# Patient Record
Sex: Male | Born: 1950 | Race: White | Hispanic: No | Marital: Married | Smoking: Never smoker
Health system: Southern US, Community
[De-identification: ages and names within clinical notes are randomized; demographics above are authoritative.]

## PROBLEM LIST (undated history)

## (undated) DIAGNOSIS — E785 Hyperlipidemia, unspecified: Secondary | ICD-10-CM

## (undated) DIAGNOSIS — H269 Unspecified cataract: Secondary | ICD-10-CM

## (undated) DIAGNOSIS — I10 Essential (primary) hypertension: Secondary | ICD-10-CM

## (undated) DIAGNOSIS — K5792 Diverticulitis of intestine, part unspecified, without perforation or abscess without bleeding: Secondary | ICD-10-CM

## (undated) DIAGNOSIS — K635 Polyp of colon: Secondary | ICD-10-CM

## (undated) DIAGNOSIS — K219 Gastro-esophageal reflux disease without esophagitis: Secondary | ICD-10-CM

## (undated) DIAGNOSIS — M1990 Unspecified osteoarthritis, unspecified site: Secondary | ICD-10-CM

## (undated) HISTORY — DX: Unspecified cataract: H26.9

## (undated) HISTORY — DX: Hyperlipidemia, unspecified: E78.5

## (undated) HISTORY — PX: COLONOSCOPY: SHX174

## (undated) HISTORY — DX: Essential (primary) hypertension: I10

## (undated) HISTORY — DX: Polyp of colon: K63.5

## (undated) HISTORY — DX: Unspecified osteoarthritis, unspecified site: M19.90

## (undated) HISTORY — PX: APPENDECTOMY: SHX54

## (undated) HISTORY — PX: PRE-MALIGNANT / BENIGN SKIN LESION EXCISION: SHX160

## (undated) HISTORY — DX: Gastro-esophageal reflux disease without esophagitis: K21.9

## (undated) HISTORY — DX: Diverticulitis of intestine, part unspecified, without perforation or abscess without bleeding: K57.92

## (undated) HISTORY — PX: POLYPECTOMY: SHX149

---

## 2005-01-15 ENCOUNTER — Ambulatory Visit: Payer: Self-pay | Admitting: Internal Medicine

## 2005-01-21 ENCOUNTER — Ambulatory Visit: Payer: Self-pay | Admitting: Internal Medicine

## 2005-02-05 ENCOUNTER — Ambulatory Visit: Payer: Self-pay | Admitting: Internal Medicine

## 2005-02-12 ENCOUNTER — Ambulatory Visit: Payer: Self-pay | Admitting: Internal Medicine

## 2005-03-26 ENCOUNTER — Ambulatory Visit: Payer: Self-pay | Admitting: Internal Medicine

## 2005-04-08 ENCOUNTER — Ambulatory Visit: Payer: Self-pay | Admitting: Gastroenterology

## 2005-04-08 ENCOUNTER — Ambulatory Visit: Payer: Self-pay | Admitting: Internal Medicine

## 2005-04-22 ENCOUNTER — Ambulatory Visit: Payer: Self-pay | Admitting: Gastroenterology

## 2005-04-22 ENCOUNTER — Encounter (INDEPENDENT_AMBULATORY_CARE_PROVIDER_SITE_OTHER): Payer: Self-pay | Admitting: Specialist

## 2005-11-13 ENCOUNTER — Ambulatory Visit: Payer: Self-pay | Admitting: Internal Medicine

## 2006-05-06 ENCOUNTER — Ambulatory Visit: Payer: Self-pay | Admitting: Internal Medicine

## 2006-05-31 ENCOUNTER — Ambulatory Visit: Payer: Self-pay | Admitting: Internal Medicine

## 2006-07-26 ENCOUNTER — Ambulatory Visit: Payer: Self-pay | Admitting: Internal Medicine

## 2007-01-18 ENCOUNTER — Ambulatory Visit: Payer: Self-pay | Admitting: Internal Medicine

## 2007-01-27 ENCOUNTER — Ambulatory Visit: Payer: Self-pay | Admitting: Internal Medicine

## 2007-03-02 ENCOUNTER — Ambulatory Visit: Payer: Self-pay | Admitting: Internal Medicine

## 2007-03-02 LAB — CONVERTED CEMR LAB
ALT: 25 units/L (ref 0–40)
AST: 27 units/L (ref 0–37)
Alkaline Phosphatase: 46 units/L (ref 39–117)
BUN: 17 mg/dL (ref 6–23)
Basophils Relative: 0.2 % (ref 0.0–1.0)
Calcium: 9.3 mg/dL (ref 8.4–10.5)
Chloride: 106 meq/L (ref 96–112)
Cholesterol: 206 mg/dL (ref 0–200)
Eosinophils Absolute: 0.1 10*3/uL (ref 0.0–0.6)
GFR calc Af Amer: 151 mL/min
GFR calc non Af Amer: 125 mL/min
HDL: 58.1 mg/dL (ref >39.0)
Lymphocytes Relative: 39.2 % (ref 12.0–46.0)
MCV: 96 fL (ref 78.0–100.0)
Monocytes Relative: 12.8 % — ABNORMAL HIGH (ref 3.0–11.0)
Neutro Abs: 1.9 10*3/uL (ref 1.4–7.7)
Platelets: 181 10*3/uL (ref 150–400)
RBC: 4.88 M/uL (ref 4.22–5.81)
TSH: 2.47 microintl units/mL (ref 0.35–5.50)
Triglycerides: 152 mg/dL — ABNORMAL HIGH (ref 0–149)
VLDL: 30 mg/dL (ref 0–40)

## 2007-04-28 ENCOUNTER — Ambulatory Visit: Payer: Self-pay | Admitting: Internal Medicine

## 2007-08-30 ENCOUNTER — Ambulatory Visit: Payer: Self-pay | Admitting: Internal Medicine

## 2007-08-30 LAB — CONVERTED CEMR LAB
Bilirubin, Direct: 0.2 mg/dL (ref 0.0–0.3)
Cholesterol: 192 mg/dL (ref 0–200)
HDL: 48.4 mg/dL (ref >39.0)
Total Bilirubin: 1.2 mg/dL (ref 0.3–1.2)
Total CHOL/HDL Ratio: 4
Total Protein: 7.1 g/dL (ref 6.0–8.3)
Triglycerides: 175 mg/dL — ABNORMAL HIGH (ref 0–149)

## 2007-08-31 DIAGNOSIS — I10 Essential (primary) hypertension: Secondary | ICD-10-CM

## 2007-08-31 DIAGNOSIS — E785 Hyperlipidemia, unspecified: Secondary | ICD-10-CM

## 2007-09-06 ENCOUNTER — Ambulatory Visit: Payer: Self-pay | Admitting: Internal Medicine

## 2007-09-06 LAB — CONVERTED CEMR LAB
Cholesterol, target level: 200 mg/dL
LDL Goal: 130 mg/dL

## 2007-12-29 ENCOUNTER — Telehealth: Payer: Self-pay | Admitting: Internal Medicine

## 2008-01-16 ENCOUNTER — Ambulatory Visit: Payer: Self-pay | Admitting: Internal Medicine

## 2008-01-16 LAB — CONVERTED CEMR LAB
ALT: 38 units/L (ref 0–53)
AST: 29 units/L (ref 0–37)
Albumin: 4.4 g/dL (ref 3.5–5.2)
Alkaline Phosphatase: 50 units/L (ref 39–117)
Triglycerides: 155 mg/dL — ABNORMAL HIGH (ref 0–149)
VLDL: 31 mg/dL (ref 0–40)

## 2008-01-23 ENCOUNTER — Ambulatory Visit: Payer: Self-pay | Admitting: Internal Medicine

## 2008-05-25 ENCOUNTER — Ambulatory Visit: Payer: Self-pay | Admitting: Internal Medicine

## 2008-05-25 LAB — CONVERTED CEMR LAB
AST: 30 units/L (ref 0–37)
Alkaline Phosphatase: 55 units/L (ref 39–117)
Basophils Absolute: 0 10*3/uL (ref 0.0–0.1)
Blood in Urine, dipstick: NEGATIVE
Chloride: 106 meq/L (ref 96–112)
Cholesterol: 170 mg/dL (ref 0–200)
Eosinophils Absolute: 0.1 10*3/uL (ref 0.0–0.7)
GFR calc non Af Amer: 93 mL/min
Glucose, Urine, Semiquant: NEGATIVE
HDL: 53.9 mg/dL (ref >39.0)
MCHC: 34.7 g/dL (ref 30.0–36.0)
MCV: 98 fL (ref 78.0–100.0)
Neutrophils Relative %: 51.7 % (ref 43.0–77.0)
Platelets: 171 10*3/uL (ref 150–400)
Potassium: 3.8 meq/L (ref 3.5–5.1)
Protein, U semiquant: NEGATIVE
Sodium: 146 meq/L — ABNORMAL HIGH (ref 135–145)
Total Bilirubin: 1.1 mg/dL (ref 0.3–1.2)
VLDL: 23 mg/dL (ref 0–40)
WBC Urine, dipstick: NEGATIVE
WBC: 4.3 10*3/uL — ABNORMAL LOW (ref 4.5–10.5)
pH: 7

## 2008-06-01 ENCOUNTER — Ambulatory Visit: Payer: Self-pay | Admitting: Internal Medicine

## 2008-07-16 ENCOUNTER — Telehealth: Payer: Self-pay | Admitting: Internal Medicine

## 2008-10-11 ENCOUNTER — Ambulatory Visit: Payer: Self-pay | Admitting: Internal Medicine

## 2008-10-16 ENCOUNTER — Encounter: Payer: Self-pay | Admitting: Internal Medicine

## 2008-12-21 ENCOUNTER — Ambulatory Visit: Payer: Self-pay | Admitting: Internal Medicine

## 2008-12-21 LAB — CONVERTED CEMR LAB
AST: 23 units/L (ref 0–37)
Albumin: 4.5 g/dL (ref 3.5–5.2)
HDL: 52.4 mg/dL (ref >39.0)
LDL Cholesterol: 108 mg/dL — ABNORMAL HIGH (ref 0–99)
Total CHOL/HDL Ratio: 3.4
Total Protein: 7.2 g/dL (ref 6.0–8.3)
Triglycerides: 95 mg/dL (ref 0–149)

## 2008-12-24 ENCOUNTER — Telehealth: Payer: Self-pay | Admitting: Internal Medicine

## 2008-12-31 ENCOUNTER — Ambulatory Visit: Payer: Self-pay | Admitting: Internal Medicine

## 2009-05-31 ENCOUNTER — Telehealth: Payer: Self-pay | Admitting: Internal Medicine

## 2009-05-31 ENCOUNTER — Ambulatory Visit: Payer: Self-pay | Admitting: Internal Medicine

## 2009-05-31 LAB — CONVERTED CEMR LAB
Albumin: 4.3 g/dL (ref 3.5–5.2)
BUN: 16 mg/dL (ref 6–23)
Basophils Absolute: 0 10*3/uL (ref 0.0–0.1)
CO2: 29 meq/L (ref 19–32)
Cholesterol: 162 mg/dL (ref 0–200)
Eosinophils Absolute: 0.1 10*3/uL (ref 0.0–0.7)
GFR calc non Af Amer: 106.04 mL/min (ref >60)
Glucose, Bld: 95 mg/dL (ref 70–99)
HCT: 44.5 % (ref 39.0–52.0)
HDL: 48.5 mg/dL (ref >39.00)
Lymphs Abs: 1.1 10*3/uL (ref 0.7–4.0)
MCHC: 35.1 g/dL (ref 30.0–36.0)
Monocytes Absolute: 0.4 10*3/uL (ref 0.1–1.0)
Monocytes Relative: 13.1 % — ABNORMAL HIGH (ref 3.0–12.0)
Neutro Abs: 1.8 10*3/uL (ref 1.4–7.7)
PSA: 3.67 ng/mL (ref 0.10–4.00)
Platelets: 144 10*3/uL — ABNORMAL LOW (ref 150.0–400.0)
Potassium: 4.2 meq/L (ref 3.5–5.1)
RDW: 12.1 % (ref 11.5–14.6)
TSH: 1.83 microintl units/mL (ref 0.35–5.50)
Total Bilirubin: 1 mg/dL (ref 0.3–1.2)
VLDL: 23.8 mg/dL (ref 0.0–40.0)

## 2009-06-07 ENCOUNTER — Ambulatory Visit: Payer: Self-pay | Admitting: Internal Medicine

## 2009-06-07 DIAGNOSIS — R972 Elevated prostate specific antigen [PSA]: Secondary | ICD-10-CM

## 2009-06-28 ENCOUNTER — Ambulatory Visit: Payer: Self-pay | Admitting: Internal Medicine

## 2009-07-09 LAB — CONVERTED CEMR LAB
PSA, Free Pct: 13 — ABNORMAL LOW (ref >25)
PSA, Free: 0.4 ng/mL
PSA: 3 ng/mL (ref 0.10–4.00)

## 2009-10-04 ENCOUNTER — Ambulatory Visit: Payer: Self-pay | Admitting: Internal Medicine

## 2010-02-24 ENCOUNTER — Ambulatory Visit: Payer: Self-pay | Admitting: Internal Medicine

## 2010-02-24 DIAGNOSIS — M76899 Other specified enthesopathies of unspecified lower limb, excluding foot: Secondary | ICD-10-CM | POA: Insufficient documentation

## 2010-03-04 ENCOUNTER — Telehealth: Payer: Self-pay | Admitting: Internal Medicine

## 2010-03-28 ENCOUNTER — Ambulatory Visit: Payer: Self-pay | Admitting: Internal Medicine

## 2010-03-28 LAB — CONVERTED CEMR LAB
PSA, Free Pct: 19 — ABNORMAL LOW (ref >25)
PSA, Free: 0.3 ng/mL

## 2010-04-03 ENCOUNTER — Ambulatory Visit: Payer: Self-pay | Admitting: Internal Medicine

## 2010-06-19 ENCOUNTER — Ambulatory Visit: Payer: Self-pay | Admitting: Internal Medicine

## 2010-06-19 LAB — CONVERTED CEMR LAB
Albumin: 4.5 g/dL (ref 3.5–5.2)
BUN: 16 mg/dL (ref 6–23)
Basophils Relative: 0.4 % (ref 0.0–3.0)
Bilirubin Urine: NEGATIVE
Bilirubin, Direct: 0.2 mg/dL (ref 0.0–0.3)
CO2: 28 meq/L (ref 19–32)
Chloride: 108 meq/L (ref 96–112)
Cholesterol: 164 mg/dL (ref 0–200)
Creatinine, Ser: 0.9 mg/dL (ref 0.4–1.5)
Eosinophils Absolute: 0.1 10*3/uL (ref 0.0–0.7)
Glucose, Bld: 95 mg/dL (ref 70–99)
Ketones, urine, test strip: NEGATIVE
MCHC: 34.4 g/dL (ref 30.0–36.0)
MCV: 97.7 fL (ref 78.0–100.0)
Monocytes Absolute: 0.6 10*3/uL (ref 0.1–1.0)
Neutrophils Relative %: 47.7 % (ref 43.0–77.0)
PSA: 1.3 ng/mL (ref 0.10–4.00)
Platelets: 172 10*3/uL (ref 150.0–400.0)
RBC: 4.69 M/uL (ref 4.22–5.81)
RDW: 13.3 % (ref 11.5–14.6)
Specific Gravity, Urine: 1.02
Total Protein: 6.8 g/dL (ref 6.0–8.3)
Triglycerides: 121 mg/dL (ref 0.0–149.0)
pH: 7

## 2010-07-11 ENCOUNTER — Ambulatory Visit: Payer: Self-pay | Admitting: Internal Medicine

## 2010-07-16 ENCOUNTER — Encounter (INDEPENDENT_AMBULATORY_CARE_PROVIDER_SITE_OTHER): Payer: Self-pay | Admitting: *Deleted

## 2010-08-26 ENCOUNTER — Encounter (INDEPENDENT_AMBULATORY_CARE_PROVIDER_SITE_OTHER): Payer: Self-pay | Admitting: *Deleted

## 2010-08-28 ENCOUNTER — Ambulatory Visit: Payer: Self-pay | Admitting: Gastroenterology

## 2010-09-11 ENCOUNTER — Ambulatory Visit: Payer: Self-pay | Admitting: Gastroenterology

## 2010-09-11 LAB — HM COLONOSCOPY

## 2010-09-16 ENCOUNTER — Encounter: Payer: Self-pay | Admitting: Gastroenterology

## 2010-11-25 NOTE — Assessment & Plan Note (Signed)
Summary: 6 mo rov/mm   Vital Signs:  Patient profile:   60 year old male Height:      71 inches Weight:      180 pounds BMI:     25.20 Temp:     98.2 degrees F oral Pulse rate:   72 / minute Resp:     14 per minute BP sitting:   130 / 80  (left arm)  Vitals Entered By: Willy Eddy, LPN (April 03, 1609 1:13 PM)   Problems Prior to Update: 1)  Bursitis, Knee  (ICD-726.60) 2)  Psa, Increased  (ICD-790.93) 3)  Hand Pain, Right  (ICD-729.5) 4)  Preventive Health Care  (ICD-V70.0) 5)  Family History of Cad Male 1st Degree Relative <50  (ICD-V17.3) 6)  Hypertension  (ICD-401.9) 7)  Hyperlipidemia  (ICD-272.4) 8)  Colonic Polyps, Hx of  (ICD-V12.72)  Medications Prior to Update: 1)  Atenolol 50 Mg Tabs (Atenolol) .... Qd 2)  Caduet 5-20 Mg  Tabs (Amlodipine-Atorvastatin) .... One By Mouth Daily 3)  Fish Oil 1000 Mg  Caps (Omega-3 Fatty Acids) .... Three Times A Day 4)  Bayer Low Strength 81 Mg  Tbec (Aspirin) .... Once Daily 5)  Multivitamins   Caps (Multiple Vitamin) .... Once Daily 6)  Astelin 137 Mcg/spray  Soln (Azelastine Hcl) .... Q Spray Two Times A Day As Needed 7)  Nasacort Aq 55 Mcg/act  Aers (Triamcinolone Acetonide(Nasal)) .... Once Daily As Needed\par 8)  Ibuprofen 800 Mg  Tabs (Ibuprofen) .... Once Daily As Needed 9)  Viagra 50 Mg  Tabs (Sildenafil Citrate) .... As Directed 10)  Doxycycline Hyclate 50 Mg Caps (Doxycycline Hyclate) .... One By Mouth Daily  Current Medications (verified): 1)  Atenolol 50 Mg Tabs (Atenolol) .... Qd 2)  Caduet 5-20 Mg  Tabs (Amlodipine-Atorvastatin) .... One By Mouth Daily 3)  Fish Oil 1000 Mg  Caps (Omega-3 Fatty Acids) .... Three Times A Day 4)  Bayer Low Strength 81 Mg  Tbec (Aspirin) .... Once Daily 5)  Multivitamins   Caps (Multiple Vitamin) .... Once Daily 6)  Astelin 137 Mcg/spray  Soln (Azelastine Hcl) .... Q Spray Two Times A Day As Needed 7)  Nasacort Aq 55 Mcg/act  Aers (Triamcinolone Acetonide(Nasal)) .... Once Daily As  Needed\par 8)  Ibuprofen 800 Mg  Tabs (Ibuprofen) .... Once Daily As Needed 9)  Viagra 50 Mg  Tabs (Sildenafil Citrate) .... As Directed 10)  Doxycycline Hyclate 50 Mg Caps (Doxycycline Hyclate) .... One By Mouth Daily  Allergies (verified): 1)  ! Doxycycline Hyclate (Doxycycline Hyclate)  Past History:  Family History: Last updated: 09/06/2007 Family History of CAD Male 1st degree relative <50  Social History: Last updated: 09/06/2007 Married Never Smoked Alcohol use-yes Drug use-no Regular exercise-no  Risk Factors: Caffeine Use: 2 (09/06/2007) Exercise: no (09/06/2007)  Risk Factors: Smoking Status: never (02/24/2010) Passive Smoke Exposure: no (02/24/2010)  Past medical, surgical, family and social histories (including risk factors) reviewed, and no changes noted (except as noted below).  Past Medical History: Reviewed history from 08/31/2007 and no changes required. Hyperlipidemia Hypertension Colonic polyps, hx of  Past Surgical History: Reviewed history from 09/06/2007 and no changes required. Appendectomy  Family History: Reviewed history from 09/06/2007 and no changes required. Family History of CAD Male 1st degree relative <50  Social History: Reviewed history from 09/06/2007 and no changes required. Married Never Smoked Alcohol use-yes Drug use-no Regular exercise-no   Impression & Recommendations:  Problem # 1:  PSA, INCREASED (ICD-790.93) resolved  Complete  Medication List: 1)  Atenolol 50 Mg Tabs (Atenolol) .... Qd 2)  Caduet 5-20 Mg Tabs (Amlodipine-atorvastatin) .... One by mouth daily 3)  Fish Oil 1000 Mg Caps (Omega-3 fatty acids) .... Three times a day 4)  Bayer Low Strength 81 Mg Tbec (Aspirin) .... Once daily 5)  Multivitamins Caps (Multiple vitamin) .... Once daily 6)  Astelin 137 Mcg/spray Soln (Azelastine hcl) .... Q spray two times a day as needed 7)  Nasacort Aq 55 Mcg/act Aers (Triamcinolone acetonide(nasal)) .... Once daily  as needed\par 8)  Ibuprofen 800 Mg Tabs (Ibuprofen) .... Once daily as needed 9)  Viagra 50 Mg Tabs (Sildenafil citrate) .... As directed 10)  Doxycycline Hyclate 50 Mg Caps (Doxycycline hyclate) .... One by mouth daily  Patient Instructions: 1)  SEPT   CPX

## 2010-11-25 NOTE — Letter (Signed)
Summary: Pre Visit Letter Revised  [facility]  [address] [place], [place] [address]   Phone: [phone]  Fax: [phone]        07/16/2010 MRN: [id number] Clinton Newton [address] [place], [place]  [address]             Procedure Date:  09-11-10   Welcome to the Gastroenterology Division at [facility].    You are scheduled to see a nurse for your pre-procedure visit on 08-28-10 at 2:00p.m on the 3rd floor at [facility], [address]  We ask that you try to arrive at our office 15 minutes prior to your appointment time to allow for check-in.  Please take a minute to review the attached form.  If you answer "Yes" to one or more of the questions on the first page, we ask that you call the person listed at your earliest opportunity.  If you answer "No" to all of the questions, please complete the rest of the form and bring it to your appointment.    Your nurse visit will consist of discussing your medical and surgical history, your immediate family medical history, and your medications.   If you are unable to list all of your medications on the form, please bring the medication bottles to your appointment and we will list them.  We will need to be aware of both prescribed and over the counter drugs.  We will need to know exact dosage information as well.    Please be prepared to read and sign documents such as consent forms, a financial agreement, and acknowledgement forms.  If necessary, and with your consent, a friend or relative is welcome to sit-in on the nurse visit with you.  Please bring your insurance card so that we may make a copy of it.  If your insurance requires a referral to see a specialist, please bring your referral form from your primary care physician.  No co-pay is required for this nurse visit.     If you cannot keep your appointment, please call [phone] to cancel or reschedule prior to your appointment date.  This  allows [place] the opportunity to schedule an appointment for another patient in need of care.    Thank you for choosing [facility] for your medical needs.  We appreciate the opportunity to care for you.  Please visit [place] at our website  to learn more about our practice.  Sincerely, The Gastroenterology Division

## 2010-11-25 NOTE — Procedures (Signed)
Summary: Colonoscopy  Patient: Jamaree Hosier Note: All result statuses are Final unless otherwise noted.  Tests: (1) Colonoscopy (COL)   COL Colonoscopy           DONE     [facility]     [address]     [place], [place]  [address]           COLONOSCOPY PROCEDURE REPORT     PATIENT:  Clinton Newton, Clinton Newton  MR#:  [id number]     BIRTHDATE:  18-Dec-1950, 58 yrs. old  GENDER:  male     ENDOSCOPIST:  Judie Petit T. Russella Dar, MD, [facility]           PROCEDURE DATE:  09/11/2010     PROCEDURE:  Colonoscopy with snare polypectomy     ASA CLASS:  Class II     INDICATIONS:  1) surveillance and high-risk screening  2) history     of adenomatous colon polyps: 03/2005.     MEDICATIONS:   Fentanyl 75 mcg IV, Versed 8 mg IV     DESCRIPTION OF PROCEDURE:   After the risks benefits and     alternatives of the procedure were thoroughly explained, informed     consent was obtained.  Digital rectal exam was performed and     revealed no abnormalities.   The LB [id number] endoscope     was introduced through the anus and advanced to the cecum, which     was identified by both the appendix and ileocecal valve, limited     by fair prep.    The quality of the prep was Moviprep fair.  The     instrument was then slowly withdrawn as the colon was fully     examined.     <<PROCEDUREIMAGES>>     FINDINGS:  A sessile polyp was found in the descending colon. It     was 5 mm in size. Polyp was snared without cautery. Retrieval was     successful. A pedunculated polyp was found in the sigmoid colon.     It was 13 mm in size. Polyp was snared, then cauterized with     monopolar cautery. Retrieval was successful. Moderate     diverticulosis was found in the sigmoid to descending colon.  A     normal appearing cecum, ileocecal valve, and appendiceal orifice     were identified. The ascending, hepatic flexure, transverse,     splenic flexure, and rectum appeared unremarkable. Retroflexed     views in the rectum  revealed internal hemorrhoids, small.  The     time to cecum =  1.5  minutes. The scope was then withdrawn (time     =  13  min) from the patient and the procedure completed.     COMPLICATIONS:  None           ENDOSCOPIC IMPRESSION:     1) 5 mm sessile polyp in the descending colon     2) 13 mm pedunculated polyp in the sigmoid colon     3) Moderate diverticulosis in the sigmoid to descending colon     4) Internal hemorrhoids     RECOMMENDATIONS:     1) Hold aspirin, aspirin products, and anti-inflamatory     medication for 2 weeks.     2) Await pathology results     3) High fiber diet with liberal fluid intake.     4) Repeat Colonoscopy in 3 years pending pathology review  [name]. Russella Dar, MD, [facility]           CC: Stacie Glaze, MD           n.     Rosalie DoctorVenita Lick. Stark at 09/11/2010 10:22 AM           Blase Mess, [id number]  Note: An exclamation mark (!) indicates a result that was not dispersed into the flowsheet. Document Creation Date: 09/11/2010 10:22 AM _______________________________________________________________________  (1) Order result status: Final Collection or observation date-time: 09/11/2010 10:17 Requested date-time:  Receipt date-time:  Reported date-time:  Referring Physician:   Ordering Physician: Claudette Head [phone]) Specimen Source:  Source: Launa Grill Order Number: [id number] Lab site:   Appended Document: Colonoscopy     Procedures Next Due Date:    Colonoscopy: 08/2013

## 2010-11-25 NOTE — Assessment & Plan Note (Signed)
Summary: cpx//ccm   Vital Signs:  Patient profile:   60 year old male Height:      71 inches Weight:      182 pounds BMI:     25.48 Temp:     98.2 degrees F oral Pulse rate:   56 / minute Resp:     14 per minute BP sitting:   130 / 80  (left arm)  Vitals Entered By: Willy Eddy, LPN (July 11, 2010 3:21 PM)  Nutrition Counseling: Patient's BMI is greater than 25 and therefore counseled on weight management options. CC: cpx- colonoscopy  >10 years-- order in-----------please note pt is listed as allergic to doxycycline (clouds memory) and he is on it daily for rosacea, Hypertension Management Is Patient Diabetic? No   Primary Care Provider:  Stacie Glaze MD  CC:  cpx- colonoscopy  >10 years-- order in-----------please note pt is listed as allergic to doxycycline (clouds memory) and he is on it daily for rosacea and Hypertension Management.  History of Present Illness: The pt was asked about all immunizations, health maint. services that are appropriate to their age and was given guidance on diet exercize  and weight management   Hypertension History:      He denies headache, chest pain, palpitations, dyspnea with exertion, orthopnea, PND, peripheral edema, visual symptoms, neurologic problems, syncope, and side effects from treatment.        Positive major cardiovascular risk factors include male age 65 years old or older, hyperlipidemia, hypertension, and family history for ischemic heart disease (males less than 72 years old).  Negative major cardiovascular risk factors include no history of diabetes and non-tobacco-user status.        Further assessment for target organ damage reveals no history of ASHD, stroke/TIA, or peripheral vascular disease.     Preventive Screening-Counseling & Management  Alcohol-Tobacco     Smoking Status: never     Passive Smoke Exposure: no     Tobacco Counseling: not indicated; no tobacco use  Problems Prior to Update: 1)   Bursitis, Knee  (ICD-726.60) 2)  Psa, Increased  (ICD-790.93) 3)  Hand Pain, Right  (ICD-729.5) 4)  Preventive Health Care  (ICD-V70.0) 5)  Family History of Cad Male 1st Degree Relative <50  (ICD-V17.3) 6)  Hypertension  (ICD-401.9) 7)  Hyperlipidemia  (ICD-272.4) 8)  Colonic Polyps, Hx of  (ICD-V12.72)  Current Problems (verified): 1)  Bursitis, Knee  (ICD-726.60) 2)  Psa, Increased  (ICD-790.93) 3)  Hand Pain, Right  (ICD-729.5) 4)  Preventive Health Care  (ICD-V70.0) 5)  Family History of Cad Male 1st Degree Relative <50  (ICD-V17.3) 6)  Hypertension  (ICD-401.9) 7)  Hyperlipidemia  (ICD-272.4) 8)  Colonic Polyps, Hx of  (ICD-V12.72)  Medications Prior to Update: 1)  Atenolol 50 Mg Tabs (Atenolol) .... Qd 2)  Caduet 5-20 Mg  Tabs (Amlodipine-Atorvastatin) .... One By Mouth Daily 3)  Fish Oil 1000 Mg  Caps (Omega-3 Fatty Acids) .... Three Times A Day 4)  Bayer Low Strength 81 Mg  Tbec (Aspirin) .... Once Daily 5)  Multivitamins   Caps (Multiple Vitamin) .... Once Daily 6)  Astelin 137 Mcg/spray  Soln (Azelastine Hcl) .... Q Spray Two Times A Day As Needed 7)  Nasacort Aq 55 Mcg/act  Aers (Triamcinolone Acetonide(Nasal)) .... Once Daily As Needed\par 8)  Ibuprofen 800 Mg  Tabs (Ibuprofen) .... Once Daily As Needed 9)  Viagra 50 Mg  Tabs (Sildenafil Citrate) .... As Directed 10)  Doxycycline Hyclate 50 Mg  Caps (Doxycycline Hyclate) .... One By Mouth Daily  Current Medications (verified): 1)  Atenolol 50 Mg Tabs (Atenolol) .... Qd 2)  Caduet 5-20 Mg  Tabs (Amlodipine-Atorvastatin) .... One By Mouth Daily 3)  Fish Oil 1000 Mg  Caps (Omega-3 Fatty Acids) .... Three Times A Day 4)  Bayer Low Strength 81 Mg  Tbec (Aspirin) .... Once Daily 5)  Multivitamins   Caps (Multiple Vitamin) .... Once Daily 6)  Astelin 137 Mcg/spray  Soln (Azelastine Hcl) .... Q Spray Two Times A Day As Needed 7)  Nasacort Aq 55 Mcg/act  Aers (Triamcinolone Acetonide(Nasal)) .... Once Daily As Needed\par 8)   Ibuprofen 800 Mg  Tabs (Ibuprofen) .... Once Daily As Needed 9)  Viagra 50 Mg  Tabs (Sildenafil Citrate) .... As Directed 10)  Doxycycline Hyclate 50 Mg Caps (Doxycycline Hyclate) .... One By Mouth Daily  Allergies (verified): 1)  ! Doxycycline Hyclate (Doxycycline Hyclate)  Past History:  Family History: Last updated: 09/06/2007 Family History of CAD Male 1st degree relative <50  Social History: Last updated: 09/06/2007 Married Never Smoked Alcohol use-yes Drug use-no Regular exercise-no  Risk Factors: Caffeine Use: 2 (09/06/2007) Exercise: no (09/06/2007)  Risk Factors: Smoking Status: never (07/11/2010) Passive Smoke Exposure: no (07/11/2010)  Past medical, surgical, family and social histories (including risk factors) reviewed, and no changes noted (except as noted below).  Past Medical History: Reviewed history from 08/31/2007 and no changes required. Hyperlipidemia Hypertension Colonic polyps, hx of  Past Surgical History: Reviewed history from 09/06/2007 and no changes required. Appendectomy  Family History: Reviewed history from 09/06/2007 and no changes required. Family History of CAD Male 1st degree relative <50  Social History: Reviewed history from 09/06/2007 and no changes required. Married Never Smoked Alcohol use-yes Drug use-no Regular exercise-no  Review of Systems  The patient denies anorexia, fever, weight loss, weight gain, vision loss, decreased hearing, hoarseness, chest pain, syncope, dyspnea on exertion, peripheral edema, prolonged cough, headaches, hemoptysis, abdominal pain, melena, hematochezia, severe indigestion/heartburn, hematuria, incontinence, genital sores, muscle weakness, suspicious skin lesions, transient blindness, difficulty walking, depression, unusual weight change, abnormal bleeding, enlarged lymph nodes, angioedema, breast masses, and testicular masses.         Flu Vaccine Consent Questions     Do you have a history  of severe allergic reactions to this vaccine? no    Any prior history of allergic reactions to egg and/or gelatin? no    Do you have a sensitivity to the preservative Thimersol? no    Do you have a past history of Guillan-Barre Syndrome? no    Do you currently have an acute febrile illness? no    Have you ever had a severe reaction to latex? no    Vaccine information given and explained to patient? yes    Are you currently pregnant? no    Lot Number:[id number]   Exp Date:04/25/2011   Site Given  Left Deltoid IM   Physical Exam  General:  Well-developed,well-nourished,in no acute distress; alert,appropriate and cooperative throughout examination Head:  male-pattern balding.   Eyes:  pupils equal and pupils round.   Ears:  R ear normal and L ear normal.   Nose:  no external deformity and no nasal discharge.   Mouth:  good dentition and pharynx pink and moist.   Neck:  No deformities, masses, or tenderness noted. Lungs:  Normal respiratory effort, chest expands symmetrically. Lungs are clear to auscultation, no crackles or wheezes. Heart:  Normal rate and regular rhythm. S1 and S2 normal without  gallop, murmur, click, rub or other extra sounds. Abdomen:  Bowel sounds positive,abdomen soft and non-tender without masses, organomegaly or hernias noted. Msk:  No deformity or scoliosis noted of thoracic or lumbar spine.   Pulses:  R and L carotid,radial,femoral,dorsalis pedis and posterior tibial pulses are full and equal bilaterally Extremities:  No clubbing, cyanosis, edema, or deformity noted with normal full range of motion of all joints.   Neurologic:  No cranial nerve deficits noted. Station and gait are normal. Plantar reflexes are down-going bilaterally. DTRs are symmetrical throughout. Sensory, motor and coordinative functions appear intact. Skin:  Intact without suspicious lesions or rashes Cervical Nodes:  No lymphadenopathy noted Axillary Nodes:  No palpable  lymphadenopathy Inguinal Nodes:  No significant adenopathy   Impression & Recommendations:  Problem # 1:  PREVENTIVE HEALTH CARE (ICD-V70.0) Assessment Unchanged The pt was asked about all immunizations, health maint. services that are appropriate to their age and was given guidance on diet exercize  and weight management  Td Booster: Historical (04/28/2007)   Flu Vax: Fluvax 3+ (07/11/2010)   Chol: 164 (06/19/2010)   HDL: 49.30 (06/19/2010)   LDL: 91 (06/19/2010)   TG: 121.0 (06/19/2010) TSH: 1.91 (06/19/2010)   PSA: 1.30 (06/19/2010) Next Colonoscopy due:: 05/2019 (06/07/2009)  Discussed using sunscreen, use of alcohol, drug use, self testicular exam, routine dental care, routine eye care, routine physical exam, seat belts, multiple vitamins, osteoporosis prevention, adequate calcium intake in diet, and recommendations for immunizations.  Discussed exercise and checking cholesterol.  Discussed gun safety, safe sex, and contraception. Also recommend checking PSA.  Complete Medication List: 1)  Atenolol 50 Mg Tabs (Atenolol) .... Qd 2)  Caduet 5-20 Mg Tabs (Amlodipine-atorvastatin) .... One by mouth daily 3)  Fish Oil 1000 Mg Caps (Omega-3 fatty acids) .... Three times a day 4)  Bayer Low Strength 81 Mg Tbec (Aspirin) .... Once daily 5)  Multivitamins Caps (Multiple vitamin) .... Once daily 6)  Astelin 137 Mcg/spray Soln (Azelastine hcl) .... Q spray two times a day as needed 7)  Nasacort Aq 55 Mcg/act Aers (Triamcinolone acetonide(nasal)) .... Once daily as needed\par 8)  Ibuprofen 800 Mg Tabs (Ibuprofen) .... Once daily as needed 9)  Viagra 50 Mg Tabs (Sildenafil citrate) .... As directed 10)  Doxycycline Hyclate 50 Mg Caps (Doxycycline hyclate) .... One by mouth daily  Other Orders: Admin 1st Vaccine (40981) Flu Vaccine 31yrs + ([address]147) EKG w/ Interpretation (93000) Gastroenterology Referral (GI)  Hypertension Assessment/Plan:      The patient's hypertensive risk group is category  B: At least one risk factor (excluding diabetes) with no target organ damage.  His calculated 10 year risk of coronary heart disease is 6 %.  Today's blood pressure is 130/80.  His blood pressure goal is < 140/90.  Patient Instructions: 1)  Please schedule a follow-up appointment in 6 months. 2)  Hepatic Panel prior to visit, ICD-9:995.20 3)  Lipid Panel prior to visit, ICD-9:272.4

## 2010-11-25 NOTE — Miscellaneous (Signed)
Summary: direct colon--ch.  Clinical Lists Changes  Medications: Added new medication of MOVIPREP 100 GM  SOLR (PEG-KCL-NACL-NASULF-NA ASC-C) As directed - Signed Rx of MOVIPREP 100 GM  SOLR (PEG-KCL-NACL-NASULF-NA ASC-C) As directed;  #1 x 0;  Signed;  Entered by: Clide Cliff RN;  Authorized by: Meryl Dare MD [facility];  Method used: Electronically to [facility]. [place] [phone]*, [address], [place], [place], [place]  [address], Ph: [phone] or [phone], Fax: [phone] Allergies: Removed allergy or adverse reaction of DOXYCYCLINE HYCLATE (DOXYCYCLINE HYCLATE)    Prescriptions: MOVIPREP 100 GM  SOLR (PEG-KCL-NACL-NASULF-NA ASC-C) As directed  #1 x 0   Entered by:   Clide Cliff RN   Authorized by:   Meryl Dare MD [facility]   Signed by:   Clide Cliff RN on 08/28/2010   Method used:   Electronically to        [facility]. [address] [phone]* (retail)       [address]       [place], [place]  [address]       Ph: [phone] or [phone]       Fax: [phone]   RxID:   [id number]

## 2010-11-25 NOTE — Progress Notes (Signed)
Summary: rx call into [facility]  Phone Note Call from Patient Call back at Home Phone [phone]   Caller: Patient Call For: Stacie Glaze MD Summary of Call: pt stated rx's was to expensive to get at [facility] please call rx's into [facility] [address] [phone] atenolol 50 mh,caduet 5-20 mg,asteline and  doxycycline 50 mg.  Initial call taken by: Heron Sabins,  Mar 04, 2010 11:24 AM    Prescriptions: DOXYCYCLINE HYCLATE 50 MG CAPS (DOXYCYCLINE HYCLATE) one by mouth daily  #90 x 3   Entered by:   Willy Eddy, LPN   Authorized by:   Stacie Glaze MD   Signed by:   Willy Eddy, LPN on 09/81/1914   Method used:   Electronically to        [facility]. [address] [phone]* (retail)       [address]       [place], [place]  [address]       Ph: [phone] or [phone]       Fax: [phone]   RxID:   [id number] ASTELIN 137 MCG/SPRAY  SOLN (AZELASTINE HCL) q spray two times a day as needed  #3 x 3   Entered by:   Willy Eddy, LPN   Authorized by:   Stacie Glaze MD   Signed by:   Willy Eddy, LPN on 64/33/2951   Method used:   Electronically to        [facility] [address] [phone]* (retail)       [address]       [place], [place]  [address]       Ph: [phone] or [phone]       Fax: [phone]   RxID:   [id number] CADUET 5-20 MG  TABS (AMLODIPINE-ATORVASTATIN) one by mouth daily  #90 x 3   Entered by:   Willy Eddy, LPN   Authorized by:   Stacie Glaze MD   Signed by:   Willy Eddy, LPN on 94/85/4627   Method used:   Electronically to        [facility] [address] [phone]* (retail)       [address]       [place], [place]  [address]       Ph: [phone] or [phone]       Fax: [phone]   RxID:   [id number] ATENOLOL 50 MG TABS (ATENOLOL) qd  #90 x 3   Entered by:   Willy Eddy, LPN   Authorized by:   Stacie Glaze MD   Signed by:   Willy Eddy, LPN on 67/61/9509   Method used:   Electronically to        [facility] [address] [phone]* (retail)       [address]       [place], [place]  [address]       Ph: [phone] or [phone]       Fax: [phone]   RxID:   [id number]

## 2010-11-25 NOTE — Letter (Signed)
Summary: [facility] Instructions  [facility]  [address] [place], [place] [address]   Phone: [phone]  Fax: [phone]       NHIA HEAPHY    04/16/1951    MRN: [id number]        Procedure Day Dorna Bloom:  Lenor Coffin  09/11/10     Arrival Time:  8:30AM     Procedure Time:  9:30AM     Location of Procedure:                    Juliann Pares _  [facility] (4th Floor)                      PREPARATION FOR COLONOSCOPY WITH MOVIPREP   Starting 5 days prior to your procedure 09/06/10 do not eat nuts, seeds, popcorn, corn, beans, peas,  salads, or any raw vegetables.  Do not take any fiber supplements (e.g. Metamucil, Citrucel, and Benefiber).  THE DAY BEFORE YOUR PROCEDURE         DATE: 09/10/10   DAY: WEDNESDAY  1.  Drink clear liquids the entire day-NO SOLID FOOD  2.  Do not drink anything colored red or purple.  Avoid juices with pulp.  No orange juice.  3.  Drink at least 64 oz. (8 glasses) of fluid/clear liquids during the day to prevent dehydration and help the prep work efficiently.  CLEAR LIQUIDS INCLUDE: Water Jello Ice Popsicles Tea (sugar ok, no milk/cream) Powdered fruit flavored drinks Coffee (sugar ok, no milk/cream) Gatorade Juice: apple, white grape, white cranberry  Lemonade Clear bullion, consomm, broth Carbonated beverages (any kind) Strained chicken noodle soup Hard Candy                             4.  In the morning, mix first dose of MoviPrep solution:    Empty 1 Pouch A and 1 Pouch B into the disposable container    Add lukewarm drinking water to the top line of the container. Mix to dissolve    Refrigerate (mixed solution should be used within 24 hrs)  5.  Begin drinking the prep at 5:00 p.m. The MoviPrep container is divided by 4 marks.   Every 15 minutes drink the solution down to the next mark (approximately 8 oz) until the full liter is complete.   6.  Follow completed prep with 16 oz of clear liquid of your choice  (Nothing red or purple).  Continue to drink clear liquids until bedtime.  7.  Before going to bed, mix second dose of MoviPrep solution:    Empty 1 Pouch A and 1 Pouch B into the disposable container    Add lukewarm drinking water to the top line of the container. Mix to dissolve    Refrigerate  THE DAY OF YOUR PROCEDURE      DATE: 09/11/10   DAY: THURSDAY  Beginning at 4:30AM (5 hours before procedure):         1. Every 15 minutes, drink the solution down to the next mark (approx 8 oz) until the full liter is complete.  2. Follow completed prep with 16 oz. of clear liquid of your choice.    3. You may drink clear liquids until 7:30AM (2 HOURS BEFORE PROCEDURE).   MEDICATION INSTRUCTIONS  Unless otherwise instructed, you should take regular prescription medications with a small sip of water   as early as possible the morning  of your procedure.   Additional medication instructions: _         OTHER INSTRUCTIONS  You will need a responsible adult at least 59 years of age to accompany you and drive you home.   This person must remain in the waiting room during your procedure.  Wear loose fitting clothing that is easily removed.  Leave jewelry and other valuables at home.  However, you may wish to bring a book to read or  an iPod/MP3 player to listen to music as you wait for your procedure to start.  Remove all body piercing jewelry and leave at home.  Total time from sign-in until discharge is approximately 2-3 hours.  You should go home directly after your procedure and rest.  You can resume normal activities the  day after your procedure.  The day of your procedure you should not:   Drive   Make legal decisions   Operate machinery   Drink alcohol   Return to work  You will receive specific instructions about eating, activities and medications before you leave.    The above instructions have been reviewed and explained to me by   Clide Cliff,  RN______________________    I fully understand and can verbalize these instructions _____________________________ Date _________

## 2010-11-25 NOTE — Letter (Signed)
Summary: Patient Notice- Polyp Results  [facility]  [address] [place], [place] [address]   Phone: [phone]  Fax: [phone]        September 16, 2010 MRN: [id number]    KAPENA HAMME [address] [place], [place]  [address]    Dear Mr. Schwenke,  I am pleased to inform you that the colon polyp(s) removed during your recent colonoscopy was (were) found to be benign (no cancer detected) upon pathologic examination.  I recommend you have a repeat sigmoidoscopy in 1 year to assess the polyp site and a repeat colonoscopy examination in 3 years to look for recurrent polyps, as having colon polyps increases your risk for having recurrent polyps or even colon cancer in the future.  Should you develop new or worsening symptoms of abdominal pain, bowel habit changes or bleeding from the rectum or bowels, please schedule an evaluation with either your primary care physician or with me.  Continue treatment plan as outlined the day of your exam.  Please call us if you are having persistent problems or have questions about your condition that have not been fully answered at this time.  Sincerely,  Meryl Dare MD [facility]  This letter has been electronically signed by your physician.  Appended Document: Patient Notice- Polyp Results Letter mailed

## 2010-11-25 NOTE — Assessment & Plan Note (Signed)
Summary: knee pain//ccm   Vital Signs:  Patient profile:   60 year old male Height:      71 inches Weight:      180 pounds BMI:     25.20 Temp:     98.2 degrees F oral Pulse rate:   72 / minute Resp:     14 per minute BP sitting:   124 / 80  (left arm)  Vitals Entered By: Willy Eddy, LPN (Feb 24, 1609 11:41 AM) CC: roa- c/o bilateral knee pain-, Hypertension Management   CC:  roa- c/o bilateral knee pain- and Hypertension Management.  History of Present Illness: blood pressure control good the pt notes loss of height was 5.10 now 5.7.5 now with left and right knee pain  increased with kneeing and with right with going down stairs no tramatic event hand are occasionally numb in the morning  Hypertension History:      He denies headache, chest pain, palpitations, dyspnea with exertion, orthopnea, PND, peripheral edema, visual symptoms, neurologic problems, syncope, and side effects from treatment.        Positive major cardiovascular risk factors include male age 48 years old or older, hyperlipidemia, hypertension, and family history for ischemic heart disease (males less than 70 years old).  Negative major cardiovascular risk factors include no history of diabetes and non-tobacco-user status.        Further assessment for target organ damage reveals no history of ASHD, stroke/TIA, or peripheral vascular disease.     Preventive Screening-Counseling & Management  Alcohol-Tobacco     Smoking Status: never     Passive Smoke Exposure: no  Problems Prior to Update: 1)  Psa, Increased  (ICD-790.93) 2)  Hand Pain, Right  (ICD-729.5) 3)  Preventive Health Care  (ICD-V70.0) 4)  Family History of Cad Male 1st Degree Relative <50  (ICD-V17.3) 5)  Hypertension  (ICD-401.9) 6)  Hyperlipidemia  (ICD-272.4) 7)  Colonic Polyps, Hx of  (ICD-V12.72)  Current Problems (verified): 1)  Psa, Increased  (ICD-790.93) 2)  Hand Pain, Right  (ICD-729.5) 3)  Preventive Health Care   (ICD-V70.0) 4)  Family History of Cad Male 1st Degree Relative <50  (ICD-V17.3) 5)  Hypertension  (ICD-401.9) 6)  Hyperlipidemia  (ICD-272.4) 7)  Colonic Polyps, Hx of  (ICD-V12.72)  Medications Prior to Update: 1)  Atenolol 50 Mg Tabs (Atenolol) .... Qd 2)  Caduet 5-20 Mg  Tabs (Amlodipine-Atorvastatin) .... One By Mouth Daily 3)  Fish Oil 1000 Mg  Caps (Omega-3 Fatty Acids) .... Three Times A Day 4)  Bayer Low Strength 81 Mg  Tbec (Aspirin) .... Once Daily 5)  Multivitamins   Caps (Multiple Vitamin) .... Once Daily 6)  Astelin 137 Mcg/spray  Soln (Azelastine Hcl) .... Q Spray Two Times A Day As Needed 7)  Nasacort Aq 55 Mcg/act  Aers (Triamcinolone Acetonide(Nasal)) .... Once Daily As Needed\par 8)  Ibuprofen 800 Mg  Tabs (Ibuprofen) .... Once Daily As Needed 9)  Viagra 50 Mg  Tabs (Sildenafil Citrate) .... As Directed 10)  Doxycycline Hyclate 50 Mg Caps (Doxycycline Hyclate) .... One By Mouth Daily  Current Medications (verified): 1)  Atenolol 50 Mg Tabs (Atenolol) .... Qd 2)  Caduet 5-20 Mg  Tabs (Amlodipine-Atorvastatin) .... One By Mouth Daily 3)  Fish Oil 1000 Mg  Caps (Omega-3 Fatty Acids) .... Three Times A Day 4)  Bayer Low Strength 81 Mg  Tbec (Aspirin) .... Once Daily 5)  Multivitamins   Caps (Multiple Vitamin) .... Once Daily 6)  Astelin 137 Mcg/spray  Soln (Azelastine Hcl) .... Q Spray Two Times A Day As Needed 7)  Nasacort Aq 55 Mcg/act  Aers (Triamcinolone Acetonide(Nasal)) .... Once Daily As Needed\par 8)  Ibuprofen 800 Mg  Tabs (Ibuprofen) .... Once Daily As Needed 9)  Viagra 50 Mg  Tabs (Sildenafil Citrate) .... As Directed 10)  Doxycycline Hyclate 50 Mg Caps (Doxycycline Hyclate) .... One By Mouth Daily  Allergies (verified): 1)  ! Doxycycline Hyclate (Doxycycline Hyclate)  Past History:  Family History: Last updated: 09/06/2007 Family History of CAD Male 1st degree relative <50  Social History: Last updated: 09/06/2007 Married Never Smoked Alcohol  use-yes Drug use-no Regular exercise-no  Risk Factors: Caffeine Use: 2 (09/06/2007) Exercise: no (09/06/2007)  Risk Factors: Smoking Status: never (02/24/2010) Passive Smoke Exposure: no (02/24/2010)  Past medical, surgical, family and social histories (including risk factors) reviewed, and no changes noted (except as noted below).  Past Medical History: Reviewed history from 08/31/2007 and no changes required. Hyperlipidemia Hypertension Colonic polyps, hx of  Past Surgical History: Reviewed history from 09/06/2007 and no changes required. Appendectomy  Family History: Reviewed history from 09/06/2007 and no changes required. Family History of CAD Male 1st degree relative <50  Social History: Reviewed history from 09/06/2007 and no changes required. Married Never Smoked Alcohol use-yes Drug use-no Regular exercise-no  Review of Systems  The patient denies anorexia, fever, weight loss, weight gain, vision loss, decreased hearing, hoarseness, chest pain, syncope, dyspnea on exertion, peripheral edema, prolonged cough, headaches, hemoptysis, abdominal pain, melena, hematochezia, severe indigestion/heartburn, hematuria, incontinence, genital sores, muscle weakness, suspicious skin lesions, transient blindness, difficulty walking, depression, unusual weight change, abnormal bleeding, enlarged lymph nodes, angioedema, breast masses, and testicular masses.    Physical Exam  General:  Well-developed,well-nourished,in no acute distress; alert,appropriate and cooperative throughout examination Head:  male-pattern balding.   Ears:  External ear exam shows no significant lesions or deformities.  Otoscopic examination reveals clear canals, tympanic membranes are intact bilaterally without bulging, retraction, inflammation or discharge. Hearing is grossly normal bilaterally. Nose:  External nasal examination shows no deformity or inflammation. Nasal mucosa are pink and moist without  lesions or exudates. Mouth:  pharynx pink and moist.   Neck:  No deformities, masses, or tenderness noted. Lungs:  Normal respiratory effort, chest expands symmetrically. Lungs are clear to auscultation, no crackles or wheezes. Heart:  Normal rate and regular rhythm. S1 and S2 normal without gallop, murmur, click, rub or other extra sounds. Abdomen:  Bowel sounds positive,abdomen soft and non-tender without masses, organomegaly or hernias noted.   Impression & Recommendations:  Problem # 1:  HYPERTENSION (ICD-401.9)  His updated medication list for this problem includes:    Atenolol 50 Mg Tabs (Atenolol) ..... Qd    Caduet 5-20 Mg Tabs (Amlodipine-atorvastatin) ..... One by mouth daily  BP today: 124/80 Prior BP: 130/82 (10/04/2009)  10 Yr Risk Heart Disease: 4 % Prior 10 Yr Risk Heart Disease: 6 % (06/07/2009)  Labs Reviewed: K+: 4.2 (05/31/2009) Creat: : 0.8 (05/31/2009)   Chol: 162 (05/31/2009)   HDL: 48.50 (05/31/2009)   LDL: 90 (05/31/2009)   TG: 119.0 (05/31/2009)  Problem # 2:  HYPERLIPIDEMIA (ICD-272.4)  His updated medication list for this problem includes:    Caduet 5-20 Mg Tabs (Amlodipine-atorvastatin) ..... One by mouth daily  Labs Reviewed: SGOT: 31 (05/31/2009)   SGPT: 33 (05/31/2009)  Lipid Goals: Chol Goal: 200 (09/06/2007)   HDL Goal: 40 (09/06/2007)   LDL Goal: 130 (09/06/2007)   TG Goal: 150 (09/06/2007)  10 Yr Risk Heart Disease: 4 % Prior 10 Yr Risk Heart Disease: 6 % (06/07/2009)   HDL:48.50 (05/31/2009), 52.4 (12/21/2008)  LDL:90 (05/31/2009), 108 (04/54/0981)  Chol:162 (05/31/2009), 179 (12/21/2008)  Trig:119.0 (05/31/2009), 95 (12/21/2008)  Problem # 3:  BURSITIS, KNEE (ICD-726.60)  Informed consent obtained and then the left kneejoint was prepped in a sterile manor and 40 mg depo and 1/2 cc 1% lidocaine injected into the synovial space. After care discussed. Pt tolerated procedure well.  Orders: Joint Aspirate / Injection, Large (20610) Depo-  Medrol 40mg  (J1030)  Complete Medication List: 1)  Atenolol 50 Mg Tabs (Atenolol) .... Qd 2)  Caduet 5-20 Mg Tabs (Amlodipine-atorvastatin) .... One by mouth daily 3)  Fish Oil 1000 Mg Caps (Omega-3 fatty acids) .... Three times a day 4)  Bayer Low Strength 81 Mg Tbec (Aspirin) .... Once daily 5)  Multivitamins Caps (Multiple vitamin) .... Once daily 6)  Astelin 137 Mcg/spray Soln (Azelastine hcl) .... Q spray two times a day as needed 7)  Nasacort Aq 55 Mcg/act Aers (Triamcinolone acetonide(nasal)) .... Once daily as needed\par 8)  Ibuprofen 800 Mg Tabs (Ibuprofen) .... Once daily as needed 9)  Viagra 50 Mg Tabs (Sildenafil citrate) .... As directed 10)  Doxycycline Hyclate 50 Mg Caps (Doxycycline hyclate) .... One by mouth daily  Hypertension Assessment/Plan:      The patient's hypertensive risk group is category B: At least one risk factor (excluding diabetes) with no target organ damage.  His calculated 10 year risk of coronary heart disease is 4 %.  Today's blood pressure is 124/80.  His blood pressure goal is < 140/90.  Patient Instructions: 1)  Please schedule a follow-up appointment for CPX in August Prescriptions: DOXYCYCLINE HYCLATE 50 MG CAPS (DOXYCYCLINE HYCLATE) one by mouth daily  #90 x 3   Entered by:   Willy Eddy, LPN   Authorized by:   Stacie Glaze MD   Signed by:   Willy Eddy, LPN on 19/14/7829   Method used:   Electronically to        [facility] [phone]* (retail)       [address] [place], [place]  [address]       Ph: [phone]       Fax: [phone]   RxID:   [id number] ASTELIN 137 MCG/SPRAY  SOLN (AZELASTINE HCL) q spray two times a day as needed  #3 x 3   Entered by:   Willy Eddy, LPN   Authorized by:   Stacie Glaze MD   Signed by:   Willy Eddy, LPN on 59/56/3875   Method used:   Electronically to        [facility] [phone]* (retail)       [address] [place], [place]  [address]       Ph: [phone]       Fax: [phone]   RxID:   [id number] CADUET 5-20 MG  TABS (AMLODIPINE-ATORVASTATIN) one by mouth daily  #90 x 3   Entered by:   Willy Eddy, LPN   Authorized by:   Stacie Glaze MD   Signed by:   Willy Eddy, LPN on 62/83/1517   Method used:   Electronically to        [facility] [phone]* (  retail)       [address] [place], [place]  [address]       Ph: [phone]       Fax: [phone]   RxID:   [id number] ATENOLOL 50 MG TABS (ATENOLOL) qd  #90 x 3   Entered by:   Willy Eddy, LPN   Authorized by:   Stacie Glaze MD   Signed by:   Willy Eddy, LPN on 74/25/9563   Method used:   Electronically to        [facility] [phone]* (retail)       [address] [place], [place]  [address]       Ph: [phone]       Fax: [phone]   RxID:   [id number]

## 2011-01-02 ENCOUNTER — Other Ambulatory Visit (INDEPENDENT_AMBULATORY_CARE_PROVIDER_SITE_OTHER): Payer: BC Managed Care – PPO | Admitting: Internal Medicine

## 2011-01-02 DIAGNOSIS — T887XXA Unspecified adverse effect of drug or medicament, initial encounter: Secondary | ICD-10-CM

## 2011-01-02 DIAGNOSIS — E785 Hyperlipidemia, unspecified: Secondary | ICD-10-CM

## 2011-01-02 LAB — HEPATIC FUNCTION PANEL
Albumin: 4.5 g/dL (ref 3.5–5.2)
Total Protein: 6.9 g/dL (ref 6.0–8.3)

## 2011-01-02 LAB — LIPID PANEL
Cholesterol: 171 mg/dL (ref 0–200)
HDL: 58 mg/dL (ref >39.00)
Triglycerides: 100 mg/dL (ref 0.0–149.0)

## 2011-01-05 ENCOUNTER — Other Ambulatory Visit: Payer: Self-pay | Admitting: *Deleted

## 2011-01-05 MED ORDER — MOMETASONE FUROATE 50 MCG/ACT NA SUSP: 2.0000 | Freq: Every day | NASAL | Status: DC

## 2011-01-05 MED ORDER — AZELASTINE HCL 0.15 % NA SOLN: 1.0000 | Freq: Two times a day (BID) | NASAL | Status: DC

## 2011-01-07 ENCOUNTER — Encounter: Payer: Self-pay | Admitting: Internal Medicine

## 2011-01-09 ENCOUNTER — Encounter: Payer: Self-pay | Admitting: Internal Medicine

## 2011-01-09 ENCOUNTER — Ambulatory Visit (INDEPENDENT_AMBULATORY_CARE_PROVIDER_SITE_OTHER): Payer: BC Managed Care – PPO | Admitting: Internal Medicine

## 2011-01-09 VITALS — BP 112/76 | HR 68 | Temp 98.2°F | Resp 14 | Ht 68.5 in | Wt 178.0 lb

## 2011-01-09 DIAGNOSIS — E785 Hyperlipidemia, unspecified: Secondary | ICD-10-CM

## 2011-01-09 DIAGNOSIS — I10 Essential (primary) hypertension: Secondary | ICD-10-CM

## 2011-01-09 DIAGNOSIS — Z0000 Encounter for general adult medical examination without abnormal findings: Secondary | ICD-10-CM

## 2011-01-09 DIAGNOSIS — R972 Elevated prostate specific antigen [PSA]: Secondary | ICD-10-CM

## 2011-04-08 ENCOUNTER — Telehealth: Payer: Self-pay | Admitting: *Deleted

## 2011-04-08 NOTE — Telephone Encounter (Signed)
Pt is having problems with pulsing in his ears and knee pain. I offered to schedule an appt with another md but pt wants to wait until he see's Dr. Lovell Sheehan. Appt made for July. Pt is okay with this and will call if he gets worse.

## 2011-05-04 ENCOUNTER — Encounter: Payer: Self-pay | Admitting: Internal Medicine

## 2011-05-04 ENCOUNTER — Ambulatory Visit (INDEPENDENT_AMBULATORY_CARE_PROVIDER_SITE_OTHER)
Admission: RE | Admit: 2011-05-04 | Discharge: 2011-05-04 | Disposition: A | Discharge status: Home / Self Care | Payer: BC Managed Care – PPO | Source: Ambulatory Visit | Attending: Internal Medicine | Admitting: Internal Medicine

## 2011-05-04 ENCOUNTER — Ambulatory Visit (INDEPENDENT_AMBULATORY_CARE_PROVIDER_SITE_OTHER): Payer: BC Managed Care – PPO | Admitting: Internal Medicine

## 2011-05-04 DIAGNOSIS — E785 Hyperlipidemia, unspecified: Secondary | ICD-10-CM

## 2011-05-04 DIAGNOSIS — I10 Essential (primary) hypertension: Secondary | ICD-10-CM

## 2011-05-04 DIAGNOSIS — R0989 Other specified symptoms and signs involving the circulatory and respiratory systems: Secondary | ICD-10-CM

## 2011-05-04 DIAGNOSIS — M1710 Unilateral primary osteoarthritis, unspecified knee: Secondary | ICD-10-CM

## 2011-05-04 DIAGNOSIS — M549 Dorsalgia, unspecified: Secondary | ICD-10-CM

## 2011-05-04 MED ORDER — MELOXICAM 15 MG PO TABS: 15.0000 mg | ORAL_TABLET | Freq: Every day | ORAL | Status: DC

## 2011-05-04 MED ORDER — MELOXICAM 15 MG PO TABS
15.0000 mg | ORAL_TABLET | Freq: Every day | ORAL | Status: DC
Start: 2011-05-04 — End: 2011-10-22

## 2011-05-04 NOTE — Patient Instructions (Addendum)
Resume on a regular basis the Astelin and NasonexBack Exercises Back exercises help treat and prevent back injuries. The goal of back exercises is to increase the strength of your abdominal and back muscles and the flexibility of your back. These exercises should be started when you no longer have back pain. Back exercises include: 1. Pelvic Tilt - Lie on your back with your knees bent. Tilt your pelvis until the lower part of your back is against the floor. Hold this position 5-10 sec and repeat 5-10 times.  2. Knee to Chest - Pull first one knee up against your chest and hold for 20-30 seconds, repeat this with the other knee, and then both knees. This may be done with the other leg straight or bent, whichever feels better.  3. Sit-Ups or Curl-Ups - Bend your knees 90 degrees. Start with tilting your pelvis, and do a partial, slow sit-up, lifting your trunk only 30-45 degrees off the floor. Take at least 2-3 sec for each sit-up. Do not do sit-ups with your knees out straight. If partial sit-ups are difficult, simply do the above but with only tightening your abdominal muscles and holding it as directed.  4. Hip-Lift - Lie on your back with your knees flexed 90 degrees. Push down with your feet and shoulders as you raise your hips a couple inches off the floor; hold for 10 sec, repeat 5-10 times.  5. Back arches - Lie on your stomach, propping yourself up on bent elbows. Slowly press on your hands, causing an arch in your low back. Repeat 3-5 times. Any initial stiffness and discomfort should lessen with repetition over time.  6. Shoulder-Lifts - Lie face down with arms beside your body. Keep hips and torso pressed to floor as you slowly lift your head and shoulders off the floor.  Do not overdo your exercises, especially in the beginning. Exercises may cause you some mild back discomfort which lasts for a few minutes; however, if the pain is more severe, or lasts for more than 15 minutes, do not continue  exercises until you see your caregiver. Improvement with exercise therapy for back problems is slow.  See your caregivers for assistance with developing a proper back exercise program. Document Released: 11/19/2004 Document Re-Released: 01/08/2009 [facility] Patient Information 2011 [place], [place].

## 2011-05-04 NOTE — Progress Notes (Signed)
  Subjective:    Patient ID: Clinton Newton, male    DOB: 05-30-1951, 60 y.o.   MRN: [id number]  HPI  Pounding and bruit in left ear that worsened over the last few weeks, now better Pain in right knee. He has a hx of bike riding. Has pain in the right knee hx. Back pain with riding and numbness in the left foot No hx of back xrays or prior back surgery     Review of Systems  Constitutional: Negative for fever and fatigue.  HENT: Negative for hearing loss, congestion, neck pain and postnasal drip.   Eyes: Negative for discharge, redness and visual disturbance.  Respiratory: Negative for cough, shortness of breath and wheezing.   Cardiovascular: Negative for leg swelling.  Gastrointestinal: Negative for abdominal pain, constipation and abdominal distention.  Genitourinary: Negative for urgency and frequency.  Musculoskeletal: Negative for joint swelling and arthralgias.  Skin: Negative for color change and rash.  Neurological: Negative for weakness and light-headedness.  Hematological: Negative for adenopathy.  Psychiatric/Behavioral: Negative for behavioral problems.   Past Medical History  Diagnosis Date  . Hyperlipidemia   . Hypertension   . Colon polyps    Past Surgical History  Procedure Date  . Appendectomy     reports that he has never smoked. He does not have any smokeless tobacco history on file. He reports that he drinks alcohol. He reports that he does not use illicit drugs. family history includes Heart disease in his father. No Known Allergies     Objective:   Physical Exam  Constitutional: He appears well-developed and well-nourished.  HENT:  Head: Normocephalic and atraumatic.  Eyes: Conjunctivae are normal. Pupils are equal, round, and reactive to light.  Neck: Normal range of motion. Neck supple.  Cardiovascular: Normal rate and regular rhythm.        No bruit with good pulses  Pulmonary/Chest: Effort normal and breath sounds normal.  Abdominal: Soft.  Bowel sounds are normal.  Musculoskeletal: He exhibits no edema and no tenderness.          Assessment & Plan:  Back pain Knee pain  eustaton tube  Dysfunction  Back xrays, carotid doplers Back exercises and  Discussion of etiology of the pack and knee pain

## 2011-05-18 ENCOUNTER — Other Ambulatory Visit: Payer: Self-pay | Admitting: Internal Medicine

## 2011-05-18 ENCOUNTER — Ambulatory Visit (INDEPENDENT_AMBULATORY_CARE_PROVIDER_SITE_OTHER): Payer: BC Managed Care – PPO | Admitting: *Deleted

## 2011-05-18 DIAGNOSIS — R0989 Other specified symptoms and signs involving the circulatory and respiratory systems: Secondary | ICD-10-CM

## 2011-05-18 DIAGNOSIS — I10 Essential (primary) hypertension: Secondary | ICD-10-CM

## 2011-05-18 DIAGNOSIS — E785 Hyperlipidemia, unspecified: Secondary | ICD-10-CM

## 2011-05-20 ENCOUNTER — Encounter: Payer: Self-pay | Admitting: Internal Medicine

## 2011-07-06 ENCOUNTER — Other Ambulatory Visit (INDEPENDENT_AMBULATORY_CARE_PROVIDER_SITE_OTHER): Payer: BC Managed Care – PPO

## 2011-07-06 DIAGNOSIS — Z0000 Encounter for general adult medical examination without abnormal findings: Secondary | ICD-10-CM

## 2011-07-06 LAB — LIPID PANEL
Cholesterol: 174 mg/dL (ref 0–200)
HDL: 56.1 mg/dL (ref >39.00)
Triglycerides: 99 mg/dL (ref 0.0–149.0)
VLDL: 19.8 mg/dL (ref 0.0–40.0)

## 2011-07-06 LAB — POCT URINALYSIS DIPSTICK
Blood, UA: NEGATIVE
Glucose, UA: NEGATIVE
Spec Grav, UA: 1.02
Urobilinogen, UA: 0.2
pH, UA: 7

## 2011-07-06 LAB — CBC WITH DIFFERENTIAL/PLATELET
Basophils Absolute: 0 10*3/uL (ref 0.0–0.1)
Eosinophils Absolute: 0.2 10*3/uL (ref 0.0–0.7)
Lymphocytes Relative: 30.5 % (ref 12.0–46.0)
MCHC: 34 g/dL (ref 30.0–36.0)
Monocytes Relative: 14.5 % — ABNORMAL HIGH (ref 3.0–12.0)
Neutro Abs: 1.9 10*3/uL (ref 1.4–7.7)
Neutrophils Relative %: 48.6 % (ref 43.0–77.0)
Platelets: 168 10*3/uL (ref 150.0–400.0)
RDW: 13.7 % (ref 11.5–14.6)

## 2011-07-06 LAB — BASIC METABOLIC PANEL
BUN: 17 mg/dL (ref 6–23)
CO2: 27 mEq/L (ref 19–32)
Calcium: 8.9 mg/dL (ref 8.4–10.5)
Creatinine, Ser: 0.7 mg/dL (ref 0.4–1.5)
GFR: 131.42 mL/min (ref >60.00)
Glucose, Bld: 94 mg/dL (ref 70–99)
Sodium: 141 mEq/L (ref 135–145)

## 2011-07-06 LAB — HEPATIC FUNCTION PANEL
ALT: 38 U/L (ref 0–53)
AST: 37 U/L (ref 0–37)
Total Bilirubin: 0.8 mg/dL (ref 0.3–1.2)
Total Protein: 6.7 g/dL (ref 6.0–8.3)

## 2011-07-06 LAB — TSH: TSH: 2.55 u[IU]/mL (ref 0.35–5.50)

## 2011-07-13 ENCOUNTER — Encounter: Payer: BC Managed Care – PPO | Admitting: Internal Medicine

## 2011-07-14 ENCOUNTER — Ambulatory Visit (INDEPENDENT_AMBULATORY_CARE_PROVIDER_SITE_OTHER): Payer: BC Managed Care – PPO | Admitting: Internal Medicine

## 2011-07-14 DIAGNOSIS — Z0000 Encounter for general adult medical examination without abnormal findings: Secondary | ICD-10-CM

## 2011-07-14 DIAGNOSIS — N411 Chronic prostatitis: Secondary | ICD-10-CM

## 2011-07-14 DIAGNOSIS — Z23 Encounter for immunization: Secondary | ICD-10-CM

## 2011-07-14 DIAGNOSIS — IMO0001 Reserved for inherently not codable concepts without codable children: Secondary | ICD-10-CM

## 2011-07-15 ENCOUNTER — Other Ambulatory Visit: Payer: Self-pay | Admitting: Internal Medicine

## 2011-07-15 DIAGNOSIS — M25569 Pain in unspecified knee: Secondary | ICD-10-CM

## 2011-08-29 NOTE — Progress Notes (Signed)
System Downtime Recovery The EMR experienced a system downtime.  This downtime occurred on 07-14-2011. During this downtime paper charting was completed by the provider.  The visit was documented on paper during the downtime and will be scanned into [facility], billing was completed by the [facility] Billing Department .  The visit is being closed on behalf of the provider. 

## 2011-10-13 ENCOUNTER — Encounter: Payer: Self-pay | Admitting: Gastroenterology

## 2011-10-13 ENCOUNTER — Other Ambulatory Visit (INDEPENDENT_AMBULATORY_CARE_PROVIDER_SITE_OTHER): Payer: BC Managed Care – PPO

## 2011-10-13 DIAGNOSIS — Z0000 Encounter for general adult medical examination without abnormal findings: Secondary | ICD-10-CM

## 2011-10-13 LAB — CBC WITH DIFFERENTIAL/PLATELET
Basophils Relative: 0.4 % (ref 0.0–3.0)
Eosinophils Relative: 2.8 % (ref 0.0–5.0)
Hemoglobin: 15.1 g/dL (ref 13.0–17.0)
Lymphocytes Relative: 30.5 % (ref 12.0–46.0)
Monocytes Relative: 13 % — ABNORMAL HIGH (ref 3.0–12.0)
Neutro Abs: 2.7 10*3/uL (ref 1.4–7.7)
Neutrophils Relative %: 53.3 % (ref 43.0–77.0)
RBC: 4.45 Mil/uL (ref 4.22–5.81)
WBC: 5 10*3/uL (ref 4.5–10.5)

## 2011-10-13 LAB — POCT URINALYSIS DIPSTICK
Bilirubin, UA: NEGATIVE
Ketones, UA: NEGATIVE
Leukocytes, UA: NEGATIVE
Nitrite, UA: NEGATIVE
Protein, UA: NEGATIVE
pH, UA: 5.5

## 2011-10-13 LAB — HEPATIC FUNCTION PANEL
ALT: 46 U/L (ref 0–53)
AST: 36 U/L (ref 0–37)
Alkaline Phosphatase: 57 U/L (ref 39–117)
Bilirubin, Direct: 0.1 mg/dL (ref 0.0–0.3)
Total Bilirubin: 0.7 mg/dL (ref 0.3–1.2)

## 2011-10-13 LAB — BASIC METABOLIC PANEL
BUN: 20 mg/dL (ref 6–23)
Chloride: 108 mEq/L (ref 96–112)
Potassium: 4.8 mEq/L (ref 3.5–5.1)
Sodium: 146 mEq/L — ABNORMAL HIGH (ref 135–145)

## 2011-10-13 LAB — LIPID PANEL
Cholesterol: 179 mg/dL (ref 0–200)
LDL Cholesterol: 91 mg/dL (ref 0–99)
Total CHOL/HDL Ratio: 3
VLDL: 32.2 mg/dL (ref 0.0–40.0)

## 2011-10-13 LAB — PSA: PSA: 2.39 ng/mL (ref 0.10–4.00)

## 2011-10-22 ENCOUNTER — Ambulatory Visit (INDEPENDENT_AMBULATORY_CARE_PROVIDER_SITE_OTHER): Payer: BC Managed Care – PPO | Admitting: Internal Medicine

## 2011-10-22 ENCOUNTER — Encounter: Payer: Self-pay | Admitting: Internal Medicine

## 2011-10-22 DIAGNOSIS — N529 Male erectile dysfunction, unspecified: Secondary | ICD-10-CM

## 2011-10-22 DIAGNOSIS — N411 Chronic prostatitis: Secondary | ICD-10-CM

## 2011-10-22 DIAGNOSIS — R972 Elevated prostate specific antigen [PSA]: Secondary | ICD-10-CM

## 2011-10-22 MED ORDER — LEVOFLOXACIN 500 MG PO TABS: 500.0000 mg | ORAL_TABLET | Freq: Every day | ORAL | Status: AC

## 2011-10-22 MED ORDER — TADALAFIL 20 MG PO TABS: 20.0000 mg | ORAL_TABLET | Freq: Every day | ORAL | Status: AC | PRN

## 2011-10-22 NOTE — Progress Notes (Signed)
  Subjective:    Patient ID: Clinton Newton, male    DOB: [birth year], 60 y.o.   MRN: [id number]  HPI    Review of Systems     Objective:   Physical Exam        Assessment & Plan:

## 2011-10-22 NOTE — Progress Notes (Signed)
Subjective:    Patient ID: Clinton Newton, male    DOB: June 11, 1951, 60 y.o.   MRN: [id number]  HPI This is a 60 year old white male who presents for followup of elevated PSA at the time of his physical he had a prior elevated PSA to greater than 3 this time his PSA was elevated in the mid twos he completed a course of Septra DS but he is pretty sure that he did not take it as directed and often missed his second dose of the day. His PSA did not respond to this antibiotic in the past his PSA has responded to Cipro His blood pressure is stable In the bursitis of his knee has improved with the use of arch support Review of Systems  Constitutional: Negative for fever and fatigue.  HENT: Negative for hearing loss, congestion, neck pain and postnasal drip.   Eyes: Negative for discharge, redness and visual disturbance.  Respiratory: Negative for cough, shortness of breath and wheezing.   Cardiovascular: Negative for leg swelling.  Gastrointestinal: Negative for abdominal pain, constipation and abdominal distention.  Genitourinary: Negative for urgency and frequency.  Musculoskeletal: Negative for joint swelling and arthralgias.  Skin: Negative for color change and rash.  Neurological: Negative for weakness and light-headedness.  Hematological: Negative for adenopathy.  Psychiatric/Behavioral: Negative for behavioral problems.   Past Medical History  Diagnosis Date  . Hyperlipidemia   . Hypertension   . Colon polyps     History   Social History  . Marital Status: Married    Spouse Name: N/A    Number of Children: N/A  . Years of Education: N/A   Occupational History  . Not on file.   Social History Main Topics  . Smoking status: Never Smoker   . Smokeless tobacco: Not on file  . Alcohol Use: Yes  . Drug Use: No  . Sexually Active: Yes   Other Topics Concern  . Not on file   Social History Narrative  . No narrative on file    Past Surgical History  Procedure Date  .  Appendectomy     Family History  Problem Relation Age of Onset  . Heart disease Father     No Known Allergies  Current Outpatient Prescriptions on File Prior to Visit  Medication Sig Dispense Refill  . amLODipine-atorvastatin (CADUET) 5-20 MG per tablet Take 1 tablet by mouth daily.        Marland Kitchen aspirin 81 MG EC tablet Take 81 mg by mouth daily.        Marland Kitchen atenolol (TENORMIN) 50 MG tablet Take 50 mg by mouth daily.        . Azelastine HCl 0.15 % SOLN 1 spray by Nasal route 2 (two) times daily.  90 mL  3  . doxycycline (VIBRAMYCIN) 100 MG capsule Take 100 mg by mouth daily.        . fish oil-omega-3 fatty acids 1000 MG capsule Take 2 g by mouth 3 (three) times daily.        Marland Kitchen ibuprofen (ADVIL,MOTRIN) 800 MG tablet Take 800 mg by mouth daily as needed.        . mometasone (NASONEX) 50 MCG/ACT nasal spray 2 sprays by Nasal route daily.  17 g  2  . Multiple Vitamin (MULTIVITAMIN) tablet Take 1 tablet by mouth daily.        Marland Kitchen triamcinolone (NASACORT AQ) 55 MCG/ACT nasal inhaler 2 sprays by Nasal route daily as needed.  BP 124/82  Pulse 60  Temp 98.3 F (36.8 C)  Resp 16  Ht 5' 8.5" (1.74 m)  Wt 183 lb (83.008 kg)  BMI 27.42 kg/m2       Objective:   Physical Exam  Nursing note and vitals reviewed. Constitutional: He appears well-developed and well-nourished.  HENT:  Head: Normocephalic and atraumatic.  Eyes: Conjunctivae are normal. Pupils are equal, round, and reactive to light.  Neck: Normal range of motion. Neck supple.  Cardiovascular: Normal rate and regular rhythm.   Pulmonary/Chest: Effort normal and breath sounds normal.  Abdominal: Soft. Bowel sounds are normal.          Assessment & Plan:  Elevated PSA will treat with a easier to take a regimen of Levaquin 500 mg by mouth daily for 21 days and repeat the PSA at the end of the month with a free PSA if PSA continues to have velocity upward referred to a urologist for consideration for biopsy.  He will  continue to use arch supports  Hypertension is stable

## 2011-10-22 NOTE — Patient Instructions (Signed)
The patient is instructed to continue all medications as prescribed. Schedule followup with check out clerk upon leaving the clinic  

## 2011-11-17 ENCOUNTER — Other Ambulatory Visit: Payer: Self-pay | Admitting: *Deleted

## 2011-11-17 MED ORDER — AMLODIPINE-ATORVASTATIN 5-20 MG PO TABS: 1.0000 | ORAL_TABLET | Freq: Every day | ORAL | Status: DC

## 2011-11-17 MED ORDER — ATENOLOL 50 MG PO TABS: 50.0000 mg | ORAL_TABLET | Freq: Every day | ORAL | Status: DC

## 2011-11-18 ENCOUNTER — Other Ambulatory Visit: Payer: Self-pay | Admitting: *Deleted

## 2011-11-18 ENCOUNTER — Other Ambulatory Visit (INDEPENDENT_AMBULATORY_CARE_PROVIDER_SITE_OTHER): Payer: BC Managed Care – PPO

## 2011-11-18 DIAGNOSIS — N411 Chronic prostatitis: Secondary | ICD-10-CM

## 2011-11-18 DIAGNOSIS — R972 Elevated prostate specific antigen [PSA]: Secondary | ICD-10-CM

## 2011-11-18 MED ORDER — AMLODIPINE-ATORVASTATIN 5-20 MG PO TABS: 1.0000 | ORAL_TABLET | Freq: Every day | ORAL | Status: DC

## 2011-11-18 MED ORDER — ATENOLOL 50 MG PO TABS: 50.0000 mg | ORAL_TABLET | Freq: Every day | ORAL | Status: DC

## 2011-11-25 ENCOUNTER — Telehealth: Payer: Self-pay | Admitting: *Deleted

## 2011-11-25 ENCOUNTER — Ambulatory Visit: Payer: BC Managed Care – PPO | Admitting: Internal Medicine

## 2011-11-25 NOTE — Telephone Encounter (Signed)
Opened in error

## 2011-11-27 ENCOUNTER — Telehealth: Payer: Self-pay | Admitting: *Deleted

## 2011-11-27 ENCOUNTER — Other Ambulatory Visit: Payer: Self-pay | Admitting: *Deleted

## 2011-11-27 DIAGNOSIS — R972 Elevated prostate specific antigen [PSA]: Secondary | ICD-10-CM

## 2011-11-27 NOTE — Telephone Encounter (Signed)
done

## 2011-12-25 ENCOUNTER — Encounter: Payer: Self-pay | Admitting: Internal Medicine

## 2011-12-25 ENCOUNTER — Ambulatory Visit (INDEPENDENT_AMBULATORY_CARE_PROVIDER_SITE_OTHER): Payer: BC Managed Care – PPO | Admitting: Internal Medicine

## 2011-12-25 VITALS — BP 138/86 | HR 68 | Temp 98.2°F | Resp 16 | Ht 68.5 in | Wt 182.0 lb

## 2011-12-25 DIAGNOSIS — R972 Elevated prostate specific antigen [PSA]: Secondary | ICD-10-CM

## 2011-12-25 DIAGNOSIS — E785 Hyperlipidemia, unspecified: Secondary | ICD-10-CM

## 2011-12-25 DIAGNOSIS — I10 Essential (primary) hypertension: Secondary | ICD-10-CM

## 2011-12-25 DIAGNOSIS — N529 Male erectile dysfunction, unspecified: Secondary | ICD-10-CM

## 2011-12-25 MED ORDER — SILDENAFIL CITRATE 100 MG PO TABS: 100.0000 mg | ORAL_TABLET | Freq: Every day | ORAL | Status: DC | PRN

## 2011-12-25 NOTE — Patient Instructions (Signed)
The patient is instructed to continue all medications as prescribed. Schedule followup with check out clerk upon leaving the clinic  

## 2011-12-25 NOTE — Progress Notes (Signed)
Subjective:    Patient ID: Clinton Newton, male    DOB: 1950/11/17, 61 y.o.   MRN: [id number]  HPI This is a scheduled return office visit for review of elevated PSA and a discussion of referral to urologist.  He has a complicated history of his elevated PSA and that there was a prior elevation to 3.0 which was felt to be due to chronic prostatitis and treated with an antibiotic.  His PSA dropped to approximately 1.5 but over the last 2 years or a slow rise in PSA that may be indicative of benign prostatic hypertrophy but could also represent a more ominous diagnosis a free PSA percentage was low and this necessitated a referral to urology.  In the interim we discussed retreatment with an antibiotic to see if there might be some significant improvement. Hypertension is stable we reviewed his hyperlipidemia labs from his physical    Review of Systems  Constitutional: Negative for fever and fatigue.  HENT: Negative for hearing loss, congestion, neck pain and postnasal drip.   Eyes: Negative for discharge, redness and visual disturbance.  Respiratory: Negative for cough, shortness of breath and wheezing.   Cardiovascular: Negative for leg swelling.  Gastrointestinal: Negative for abdominal pain, constipation and abdominal distention.  Genitourinary: Negative for urgency and frequency.  Musculoskeletal: Negative for joint swelling and arthralgias.  Skin: Negative for color change and rash.  Neurological: Negative for weakness and light-headedness.  Hematological: Negative for adenopathy.  Psychiatric/Behavioral: Negative for behavioral problems.   Past Medical History  Diagnosis Date  . Hyperlipidemia   . Hypertension   . Colon polyps     History   Social History  . Marital Status: Married    Spouse Name: N/A    Number of Children: N/A  . Years of Education: N/A   Occupational History  . Not on file.   Social History Main Topics  . Smoking status: Never Smoker   .  Smokeless tobacco: Not on file  . Alcohol Use: Yes  . Drug Use: No  . Sexually Active: Yes   Other Topics Concern  . Not on file   Social History Narrative  . No narrative on file    Past Surgical History  Procedure Date  . Appendectomy     Family History  Problem Relation Age of Onset  . Heart disease Father     No Known Allergies  Current Outpatient Prescriptions on File Prior to Visit  Medication Sig Dispense Refill  . amLODipine-atorvastatin (CADUET) 5-20 MG per tablet Take 1 tablet by mouth daily.  30 tablet  1  . aspirin 81 MG EC tablet Take 81 mg by mouth daily.        Marland Kitchen atenolol (TENORMIN) 50 MG tablet Take 1 tablet (50 mg total) by mouth daily.  30 tablet  1  . Azelastine HCl 0.15 % SOLN 1 spray by Nasal route 2 (two) times daily.  90 mL  3  . doxycycline (VIBRAMYCIN) 100 MG capsule Take 100 mg by mouth daily.        . fish oil-omega-3 fatty acids 1000 MG capsule Take 2 g by mouth 3 (three) times daily.        Marland Kitchen ibuprofen (ADVIL,MOTRIN) 800 MG tablet Take 800 mg by mouth daily as needed.        . Multiple Vitamin (MULTIVITAMIN) tablet Take 1 tablet by mouth daily.        Marland Kitchen triamcinolone (NASACORT AQ) 55 MCG/ACT nasal inhaler 2 sprays by Nasal route  daily as needed.          BP 138/86  Pulse 68  Temp 98.2 F (36.8 C)  Resp 16  Ht 5' 8.5" (1.74 m)  Wt 182 lb (82.555 kg)  BMI 27.27 kg/m2       Objective:   Physical Exam  Nursing note and vitals reviewed. Constitutional: He appears well-developed and well-nourished.  HENT:  Head: Normocephalic and atraumatic.  Eyes: Conjunctivae are normal. Pupils are equal, round, and reactive to light.  Neck: Normal range of motion. Neck supple.  Cardiovascular: Normal rate and regular rhythm.   Pulmonary/Chest: Effort normal and breath sounds normal.  Abdominal: Soft. Bowel sounds are normal.          Assessment & Plan:  We had 30 minutes of face-to-face evaluation of which more than half was spent in  counseling about elevated PSA and its implications as well as the possible decision points he will have to make with the urologist.  His blood pressure is stable on his current medications.  We reviewed his lipid therapy on atorvastatin and recommended increasing his visual to 1000 mg twice daily.  He was given a prescription for doxycycline to take twice daily for 21 days prior to his urology office visit at which time I suspect a PSA will be redrawn before any interventions

## 2012-01-20 ENCOUNTER — Other Ambulatory Visit: Payer: Self-pay | Admitting: Internal Medicine

## 2012-03-02 ENCOUNTER — Other Ambulatory Visit: Payer: Self-pay | Admitting: Internal Medicine

## 2012-03-30 ENCOUNTER — Other Ambulatory Visit: Payer: Self-pay | Admitting: Internal Medicine

## 2012-06-14 ENCOUNTER — Other Ambulatory Visit: Payer: Self-pay | Admitting: Internal Medicine

## 2012-07-08 ENCOUNTER — Other Ambulatory Visit: Payer: BC Managed Care – PPO

## 2012-07-15 ENCOUNTER — Encounter: Payer: BC Managed Care – PPO | Admitting: Internal Medicine

## 2012-07-25 ENCOUNTER — Encounter: Payer: Self-pay | Admitting: Gastroenterology

## 2012-08-01 ENCOUNTER — Encounter: Payer: BC Managed Care – PPO | Admitting: Internal Medicine

## 2012-09-12 ENCOUNTER — Other Ambulatory Visit (INDEPENDENT_AMBULATORY_CARE_PROVIDER_SITE_OTHER): Payer: BC Managed Care – PPO

## 2012-09-12 DIAGNOSIS — Z0000 Encounter for general adult medical examination without abnormal findings: Secondary | ICD-10-CM

## 2012-09-12 LAB — POCT URINALYSIS DIPSTICK
Leukocytes, UA: NEGATIVE
Nitrite, UA: NEGATIVE
Urobilinogen, UA: 0.2
pH, UA: 7

## 2012-09-12 LAB — HEPATIC FUNCTION PANEL
Bilirubin, Direct: 0.1 mg/dL (ref 0.0–0.3)
Total Bilirubin: 1.1 mg/dL (ref 0.3–1.2)
Total Protein: 7.1 g/dL (ref 6.0–8.3)

## 2012-09-12 LAB — CBC WITH DIFFERENTIAL/PLATELET
Basophils Relative: 0.4 % (ref 0.0–3.0)
Eosinophils Absolute: 0.2 10*3/uL (ref 0.0–0.7)
MCHC: 34 g/dL (ref 30.0–36.0)
MCV: 96.5 fl (ref 78.0–100.0)
Monocytes Absolute: 0.6 10*3/uL (ref 0.1–1.0)
Neutrophils Relative %: 51.6 % (ref 43.0–77.0)
Platelets: 174 10*3/uL (ref 150.0–400.0)

## 2012-09-12 LAB — BASIC METABOLIC PANEL
BUN: 17 mg/dL (ref 6–23)
CO2: 29 mEq/L (ref 19–32)
Chloride: 105 mEq/L (ref 96–112)
Creatinine, Ser: 0.8 mg/dL (ref 0.4–1.5)
Glucose, Bld: 106 mg/dL — ABNORMAL HIGH (ref 70–99)

## 2012-09-12 LAB — PSA: PSA: 1.71 ng/mL (ref 0.10–4.00)

## 2012-09-12 LAB — LIPID PANEL: HDL: 57.4 mg/dL (ref >39.00)

## 2012-09-19 ENCOUNTER — Encounter: Payer: Self-pay | Admitting: Internal Medicine

## 2012-09-19 ENCOUNTER — Ambulatory Visit (INDEPENDENT_AMBULATORY_CARE_PROVIDER_SITE_OTHER): Payer: BC Managed Care – PPO | Admitting: Internal Medicine

## 2012-09-19 VITALS — BP 130/90 | HR 72 | Temp 98.2°F | Resp 16 | Ht 68.5 in | Wt 182.0 lb

## 2012-09-19 DIAGNOSIS — Z23 Encounter for immunization: Secondary | ICD-10-CM

## 2012-09-19 DIAGNOSIS — Z0000 Encounter for general adult medical examination without abnormal findings: Secondary | ICD-10-CM

## 2012-09-19 DIAGNOSIS — R972 Elevated prostate specific antigen [PSA]: Secondary | ICD-10-CM

## 2012-09-19 NOTE — Progress Notes (Signed)
Subjective:    Patient ID: Clinton Newton, male    DOB: [birth year], 61 y.o.   MRN: [id number]  HPI Vision presents visually physical examination.  He is followed for hypertension hyperlipidemia and most recently he is followed for treatment of prostatism and a consult with a urologist.  His PSA had climbed he was seen by the urologist to rule out possible prostate cancer his prostate examination did not reveal any focal nodularity and a biopsy was discussed but he was given some treatment strategies and since then his PSA has decreased.  In the face of a decreasing PSA we will continue to monitor and he has agreed to conservative therapy rather than a biopsy at this point   Review of Systems  Constitutional: Negative for fever and fatigue.  HENT: Negative for hearing loss, congestion, neck pain and postnasal drip.   Eyes: Negative for discharge, redness and visual disturbance.  Respiratory: Negative for cough, shortness of breath and wheezing.   Cardiovascular: Negative for leg swelling.  Gastrointestinal: Negative for abdominal pain, constipation and abdominal distention.  Genitourinary: Negative for urgency and frequency.  Musculoskeletal: Negative for joint swelling and arthralgias.  Skin: Negative for color change and rash.  Neurological: Negative for weakness and light-headedness.  Hematological: Negative for adenopathy.  Psychiatric/Behavioral: Negative for behavioral problems.   Past Medical History  Diagnosis Date  . Hyperlipidemia   . Hypertension   . Colon polyps     History   Social History  . Marital Status: Married    Spouse Name: N/A    Number of Children: N/A  . Years of Education: N/A   Occupational History  . Not on file.   Social History Main Topics  . Smoking status: Never Smoker   . Smokeless tobacco: Not on file  . Alcohol Use: Yes  . Drug Use: No  . Sexually Active: Yes   Other Topics Concern  . Not on file   Social History Narrative  . No  narrative on file    Past Surgical History  Procedure Date  . Appendectomy     Family History  Problem Relation Age of Onset  . Heart disease Father     No Known Allergies  Current Outpatient Prescriptions on File Prior to Visit  Medication Sig Dispense Refill  . amLODipine-atorvastatin (CADUET) 5-20 MG per tablet Take 1 tablet by mouth daily.  30 tablet  1  . aspirin 81 MG EC tablet Take 81 mg by mouth daily.        . ASTEPRO 0.15 % SOLN USE 1 SPRAY NASALLY TWICE  DAILY  60 mL  3  . atenolol (TENORMIN) 50 MG tablet Take 1 tablet (50 mg total) by mouth daily.  30 tablet  1  . doxycycline (VIBRAMYCIN) 100 MG capsule Take 100 mg by mouth daily.        . fish oil-omega-3 fatty acids 1000 MG capsule Take 2 g by mouth 3 (three) times daily.        Marland Kitchen ibuprofen (ADVIL,MOTRIN) 800 MG tablet Take 800 mg by mouth daily as needed.        . Multiple Vitamin (MULTIVITAMIN) tablet Take 1 tablet by mouth daily.        Marland Kitchen NASONEX 50 MCG/ACT nasal spray USE 2 SPRAYS NASALLY DAILY  17 g  2  . triamcinolone (NASACORT AQ) 55 MCG/ACT nasal inhaler 2 sprays by Nasal route daily as needed.        . [DISCONTINUED] mometasone (NASONEX) 50 MCG/ACT nasal  spray 2 sprays by Nasal route daily.  17 g  2  . [DISCONTINUED] sildenafil (VIAGRA) 100 MG tablet Take 1 tablet (100 mg total) by mouth daily as needed for erectile dysfunction.  30 tablet  3    BP 130/90  Pulse 72  Temp 98.2 F (36.8 C)  Resp 16  Ht 5' 8.5" (1.74 m)  Wt 182 lb (82.555 kg)  BMI 27.27 kg/m2       Objective:   Physical Exam  Nursing note and vitals reviewed. Constitutional: He is oriented to person, place, and time. He appears well-developed and well-nourished.  HENT:  Head: Normocephalic and atraumatic.  Eyes: Conjunctivae normal are normal. Pupils are equal, round, and reactive to light.  Neck: Normal range of motion. Neck supple.  Cardiovascular: Normal rate and regular rhythm.   Pulmonary/Chest: Effort normal and breath  sounds normal.  Abdominal: Soft. Bowel sounds are normal.  Genitourinary: Rectum normal and prostate normal.  Musculoskeletal: Normal range of motion.  Neurological: He is alert and oriented to person, place, and time.  Skin: Skin is warm and dry.  Psychiatric: He has a normal mood and affect. His behavior is normal.          Assessment & Plan:   Patient presents for yearly preventative medicine examination.   all immunizations and health maintenance protocols were reviewed with the patient and they are up to date with these protocols.   screening laboratory values were reviewed with the patient including screening of hyperlipidemia PSA renal function and hepatic function.   There medications past medical history social history problem list and allergies were reviewed in detail.   Goals were established with regard to weight loss exercise diet in compliance with medications   Continue monitoring PSA at six-month intervals as well as blood pressure stable blood pressure on current medications stable lipids on current medication

## 2012-09-19 NOTE — Patient Instructions (Signed)
The patient is instructed to continue all medications as prescribed. Schedule followup with check out clerk upon leaving the clinic  

## 2012-09-25 ENCOUNTER — Other Ambulatory Visit: Payer: Self-pay | Admitting: Internal Medicine

## 2012-10-08 ENCOUNTER — Encounter: Payer: Self-pay | Admitting: Internal Medicine

## 2012-10-08 ENCOUNTER — Ambulatory Visit (INDEPENDENT_AMBULATORY_CARE_PROVIDER_SITE_OTHER): Payer: BC Managed Care – PPO | Admitting: Internal Medicine

## 2012-10-08 VITALS — BP 138/88 | HR 92 | Temp 98.5°F | Ht 68.5 in | Wt 184.0 lb

## 2012-10-08 DIAGNOSIS — R3989 Other symptoms and signs involving the genitourinary system: Secondary | ICD-10-CM

## 2012-10-08 DIAGNOSIS — R39198 Other difficulties with micturition: Secondary | ICD-10-CM | POA: Insufficient documentation

## 2012-10-08 LAB — POCT URINALYSIS DIPSTICK
Bilirubin, UA: NEGATIVE
Blood, UA: NEGATIVE
Glucose, UA: NEGATIVE
Ketones, UA: NEGATIVE
Nitrite, UA: NEGATIVE
pH, UA: 7

## 2012-10-08 MED ORDER — CIPROFLOXACIN HCL 500 MG PO TABS: 500.0000 mg | ORAL_TABLET | Freq: Two times a day (BID) | ORAL | Status: DC

## 2012-10-08 NOTE — Progress Notes (Signed)
  Subjective:    Patient ID: Clinton Newton, male    DOB: Aug 08, 1951, 61 y.o.   MRN: [id number]  HPI For a week, has had a feeling of tightness in suprapubic area, "my bladder" Worse this morning Can void but doesn't feel like he can empty---thinks he does empty but still has some urgency No nocturia Frequency is normal Slight discomfort when passing urine but burning No hematuria  Libido has been down Did ejaculate this week--no pain or blood that he knows of  No fever when measured Did have sense of discomfort that made him feel he may have a fever  Current Outpatient Prescriptions on File Prior to Visit  Medication Sig Dispense Refill  . amLODipine-atorvastatin (CADUET) 5-20 MG per tablet TAKE 1 TABLET DAILY  90 tablet  3  . aspirin 81 MG EC tablet Take 81 mg by mouth daily.        Marland Kitchen atenolol (TENORMIN) 50 MG tablet Take 1 tablet (50 mg total) by mouth daily.  30 tablet  1  . ibuprofen (ADVIL,MOTRIN) 800 MG tablet Take 800 mg by mouth daily as needed.        . Multiple Vitamin (MULTIVITAMIN) tablet Take 1 tablet by mouth daily.        Marland Kitchen NASONEX 50 MCG/ACT nasal spray USE 2 SPRAYS NASALLY DAILY  17 g  2  . sildenafil (VIAGRA) 100 MG tablet Take 100 mg by mouth daily as needed.      . triamcinolone (NASACORT AQ) 55 MCG/ACT nasal inhaler 2 sprays by Nasal route daily as needed.        . ASTEPRO 0.15 % SOLN USE 1 SPRAY NASALLY TWICE  DAILY  60 mL  3    No Known Allergies  Past Medical History  Diagnosis Date  . Hyperlipidemia   . Hypertension   . Colon polyps     Past Surgical History  Procedure Date  . Appendectomy     Family History  Problem Relation Age of Onset  . Heart disease Father     History   Social History  . Marital Status: Married    Spouse Name: N/A    Number of Children: N/A  . Years of Education: N/A   Occupational History  . Not on file.   Social History Main Topics  . Smoking status: Never Smoker   . Smokeless tobacco: Not on file  .  Alcohol Use: Yes  . Drug Use: No  . Sexually Active: Yes   Other Topics Concern  . Not on file   Social History Narrative  . No narrative on file   Review of Systems Diarrhea for one day this week, then more often and smaller this week Appetite is fine    Objective:   Physical Exam  Abdominal: Soft. There is no tenderness.       No suprapubic dullness  Genitourinary:       Prostate is not boggy and normal median sulcus Some urgency with palpation but not really tender Mild dilation of right epididymis but no inflammation or tenderness in scrotum          Assessment & Plan:

## 2012-10-08 NOTE — Assessment & Plan Note (Signed)
Symptoms are bladder oriented but urine is negative Prostate is not tender but may have low grade infection Will try empiric antibiotic Rx May need reevaluation if persistent symptoms

## 2012-10-26 ENCOUNTER — Encounter: Payer: Self-pay | Admitting: Internal Medicine

## 2012-10-28 ENCOUNTER — Encounter: Payer: Self-pay | Admitting: Internal Medicine

## 2012-10-28 ENCOUNTER — Ambulatory Visit (INDEPENDENT_AMBULATORY_CARE_PROVIDER_SITE_OTHER): Payer: BC Managed Care – PPO | Admitting: Internal Medicine

## 2012-10-28 VITALS — BP 130/80 | HR 72 | Temp 98.7°F | Resp 16 | Ht 68.5 in | Wt 184.0 lb

## 2012-10-28 DIAGNOSIS — R39198 Other difficulties with micturition: Secondary | ICD-10-CM

## 2012-10-28 DIAGNOSIS — N410 Acute prostatitis: Secondary | ICD-10-CM

## 2012-10-28 DIAGNOSIS — R3989 Other symptoms and signs involving the genitourinary system: Secondary | ICD-10-CM

## 2012-10-28 MED ORDER — SULFAMETHOXAZOLE-TRIMETHOPRIM 800-160 MG PO TABS: 1.0000 | ORAL_TABLET | Freq: Two times a day (BID) | ORAL | Status: DC

## 2012-10-28 NOTE — Progress Notes (Signed)
Subjective:    Patient ID: Clinton Newton, male    DOB: April 20, 1951, 62 y.o.   MRN: [id number]  HPI  Patient was treated for acute on chronic prostatitis with ciprofloxacin he is taking 10 days and has persistent symptoms.  He has no fever chills  Review of Systems  Constitutional: Negative for fever and fatigue.  HENT: Negative for hearing loss, congestion, neck pain and postnasal drip.   Eyes: Negative for discharge, redness and visual disturbance.  Respiratory: Negative for cough, shortness of breath and wheezing.   Cardiovascular: Negative for leg swelling.  Gastrointestinal: Negative for abdominal pain, constipation and abdominal distention.  Genitourinary: Negative for urgency and frequency.  Musculoskeletal: Negative for joint swelling and arthralgias.  Skin: Negative for color change and rash.  Neurological: Negative for weakness and light-headedness.  Hematological: Negative for adenopathy.  Psychiatric/Behavioral: Negative for behavioral problems.   Past Medical History  Diagnosis Date  . Hyperlipidemia   . Hypertension   . Colon polyps     History   Social History  . Marital Status: Married    Spouse Name: N/A    Number of Children: N/A  . Years of Education: N/A   Occupational History  . Not on file.   Social History Main Topics  . Smoking status: Never Smoker   . Smokeless tobacco: Not on file  . Alcohol Use: Yes  . Drug Use: No  . Sexually Active: Yes   Other Topics Concern  . Not on file   Social History Narrative  . No narrative on file    Past Surgical History  Procedure Date  . Appendectomy     Family History  Problem Relation Age of Onset  . Heart disease Father     No Known Allergies  Current Outpatient Prescriptions on File Prior to Visit  Medication Sig Dispense Refill  . amLODipine-atorvastatin (CADUET) 5-20 MG per tablet TAKE 1 TABLET DAILY  90 tablet  3  . aspirin 81 MG EC tablet Take 81 mg by mouth daily.        . ASTEPRO  0.15 % SOLN USE 1 SPRAY NASALLY TWICE  DAILY  60 mL  3  . atenolol (TENORMIN) 50 MG tablet Take 1 tablet (50 mg total) by mouth daily.  30 tablet  1  . ibuprofen (ADVIL,MOTRIN) 800 MG tablet Take 800 mg by mouth daily as needed.        . Multiple Vitamin (MULTIVITAMIN) tablet Take 1 tablet by mouth daily.        Marland Kitchen NASONEX 50 MCG/ACT nasal spray USE 2 SPRAYS NASALLY DAILY  17 g  2  . sildenafil (VIAGRA) 100 MG tablet Take 100 mg by mouth daily as needed.      . triamcinolone (NASACORT AQ) 55 MCG/ACT nasal inhaler 2 sprays by Nasal route daily as needed.          BP 130/80  Pulse 72  Temp 98.7 F (37.1 C)  Resp 16  Ht 5' 8.5" (1.74 m)  Wt 184 lb (83.462 kg)  BMI 27.57 kg/m2        Objective:   Physical Exam  Nursing note and vitals reviewed. Constitutional: He appears well-developed and well-nourished.  HENT:  Head: Normocephalic and atraumatic.  Eyes: Conjunctivae normal are normal. Pupils are equal, round, and reactive to light.  Neck: Normal range of motion. Neck supple.  Cardiovascular: Normal rate and regular rhythm.   Pulmonary/Chest: Effort normal and breath sounds normal.  Abdominal: Soft. Bowel sounds are normal.  Assessment & Plan:  Acute prostate infection resistant to cipro  Plan Septra DS 1 by mouth twice a day 21 days

## 2012-11-28 ENCOUNTER — Encounter: Payer: Self-pay | Admitting: Internal Medicine

## 2013-03-14 ENCOUNTER — Other Ambulatory Visit: Payer: BC Managed Care – PPO

## 2013-03-14 ENCOUNTER — Encounter: Payer: Self-pay | Admitting: Internal Medicine

## 2013-03-14 DIAGNOSIS — R972 Elevated prostate specific antigen [PSA]: Secondary | ICD-10-CM

## 2013-03-15 ENCOUNTER — Other Ambulatory Visit: Payer: BC Managed Care – PPO

## 2013-03-15 LAB — PSA, TOTAL AND FREE
PSA, Free Pct: 17 % — ABNORMAL LOW (ref >25)
PSA, Free: 0.32 ng/mL

## 2013-03-22 ENCOUNTER — Ambulatory Visit (INDEPENDENT_AMBULATORY_CARE_PROVIDER_SITE_OTHER): Payer: BC Managed Care – PPO | Admitting: Internal Medicine

## 2013-03-22 ENCOUNTER — Encounter: Payer: Self-pay | Admitting: Internal Medicine

## 2013-03-22 VITALS — BP 118/80 | HR 68 | Temp 98.2°F | Resp 16 | Ht 68.5 in | Wt 186.0 lb

## 2013-03-22 DIAGNOSIS — R972 Elevated prostate specific antigen [PSA]: Secondary | ICD-10-CM

## 2013-03-22 DIAGNOSIS — Z2911 Encounter for prophylactic immunotherapy for respiratory syncytial virus (RSV): Secondary | ICD-10-CM

## 2013-03-22 DIAGNOSIS — Z23 Encounter for immunization: Secondary | ICD-10-CM

## 2013-03-22 NOTE — Progress Notes (Signed)
Subjective:    Patient ID: Clinton Newton, male    DOB: 1951/01/31, 62 y.o.   MRN: [id number]  HPI PSA bumps stabilized.  Last PSA was 1.71 today's PSA is 1.85 her nonstatistical change in 6 months.     Review of Systems  Constitutional: Negative for fever and fatigue.  HENT: Negative for hearing loss, congestion, neck pain and postnasal drip.   Eyes: Negative for discharge, redness and visual disturbance.  Respiratory: Negative for cough, shortness of breath and wheezing.   Cardiovascular: Negative for leg swelling.  Gastrointestinal: Negative for abdominal pain, constipation and abdominal distention.  Genitourinary: Negative for urgency and frequency.  Musculoskeletal: Negative for joint swelling and arthralgias.  Skin: Negative for color change and rash.  Neurological: Negative for weakness and light-headedness.  Hematological: Negative for adenopathy.  Psychiatric/Behavioral: Negative for behavioral problems.   Past Medical History  Diagnosis Date  . Hyperlipidemia   . Hypertension   . Colon polyps     History   Social History  . Marital Status: Married    Spouse Name: N/A    Number of Children: N/A  . Years of Education: N/A   Occupational History  . Not on file.   Social History Main Topics  . Smoking status: Never Smoker   . Smokeless tobacco: Not on file  . Alcohol Use: Yes  . Drug Use: No  . Sexually Active: Yes   Other Topics Concern  . Not on file   Social History Narrative  . No narrative on file    Past Surgical History  Procedure Laterality Date  . Appendectomy      Family History  Problem Relation Age of Onset  . Heart disease Father     No Known Allergies  Current Outpatient Prescriptions on File Prior to Visit  Medication Sig Dispense Refill  . amLODipine-atorvastatin (CADUET) 5-20 MG per tablet TAKE 1 TABLET DAILY  90 tablet  3  . aspirin 81 MG EC tablet Take 81 mg by mouth daily.        . ASTEPRO 0.15 % SOLN USE 1 SPRAY  NASALLY TWICE  DAILY  60 mL  3  . atenolol (TENORMIN) 50 MG tablet Take 1 tablet (50 mg total) by mouth daily.  30 tablet  1  . ibuprofen (ADVIL,MOTRIN) 800 MG tablet Take 800 mg by mouth daily as needed.        . Multiple Vitamin (MULTIVITAMIN) tablet Take 1 tablet by mouth daily.        Marland Kitchen NASONEX 50 MCG/ACT nasal spray USE 2 SPRAYS NASALLY DAILY  17 g  2  . sildenafil (VIAGRA) 100 MG tablet Take 100 mg by mouth daily as needed.      . triamcinolone (NASACORT AQ) 55 MCG/ACT nasal inhaler 2 sprays by Nasal route daily as needed.         No current facility-administered medications on file prior to visit.    BP 118/80  Pulse 68  Temp(Src) 98.2 F (36.8 C)  Resp 16  Ht 5' 8.5" (1.74 m)  Wt 186 lb (84.369 kg)  BMI 27.87 kg/m2       Objective:   Physical Exam  Nursing note and vitals reviewed. Constitutional: He appears well-developed and well-nourished.  HENT:  Head: Normocephalic and atraumatic.  Eyes: Conjunctivae are normal. Pupils are equal, round, and reactive to light.  Neck: Normal range of motion. Neck supple.  Cardiovascular: Normal rate and regular rhythm.   Pulmonary/Chest: Effort normal and breath sounds normal.  Abdominal: Soft. Bowel sounds are normal.          Assessment & Plan:  Stable PSA Monitor the PSA at physical in 6 months and then a decision will be made upon whether or not we will continue the 6 month monitoring.   Bike riding and may play a role in the PSA if the patient is absent from bike riding for a period of time prior to the PSA management and was not at the belt at 2.5 that we saw 1 year ago   Shingles vaccine today

## 2013-03-22 NOTE — Patient Instructions (Signed)
The patient is instructed to continue all medications as prescribed. Schedule followup with check out clerk upon leaving the clinic  

## 2013-05-03 ENCOUNTER — Encounter: Payer: Self-pay | Admitting: Internal Medicine

## 2013-05-03 ENCOUNTER — Other Ambulatory Visit: Payer: Self-pay | Admitting: *Deleted

## 2013-05-03 MED ORDER — HYDROCORTISONE ACETATE 25 MG RE SUPP: 25.0000 mg | Freq: Two times a day (BID) | RECTAL | Status: DC

## 2013-05-03 MED ORDER — AVANAFIL 100 MG PO TABS: 100.0000 mg | ORAL_TABLET | ORAL | Status: DC

## 2013-05-03 NOTE — Telephone Encounter (Signed)
Per dr Lovell Sheehan- probably be best to try mucinex fast max at first and see if that will work- also gargle with warm salt water.

## 2013-05-31 ENCOUNTER — Encounter: Payer: Self-pay | Admitting: Internal Medicine

## 2013-06-13 ENCOUNTER — Encounter: Payer: Self-pay | Admitting: Internal Medicine

## 2013-06-13 ENCOUNTER — Telehealth: Payer: Self-pay | Admitting: Internal Medicine

## 2013-06-13 NOTE — Telephone Encounter (Signed)
Sent message to my chart as pt requested

## 2013-06-13 NOTE — Telephone Encounter (Signed)
Prior Berkley Harvey was denied on the [facility]. It is not covered under his plan. Thank you.

## 2013-06-14 ENCOUNTER — Other Ambulatory Visit: Payer: Self-pay | Admitting: *Deleted

## 2013-06-14 MED ORDER — SILDENAFIL CITRATE 100 MG PO TABS: ORAL_TABLET | ORAL | Status: DC

## 2013-06-16 ENCOUNTER — Telehealth: Payer: Self-pay | Admitting: Gastroenterology

## 2013-06-16 NOTE — Telephone Encounter (Signed)
See Recall Sheet Scanned  °

## 2013-07-18 ENCOUNTER — Ambulatory Visit (INDEPENDENT_AMBULATORY_CARE_PROVIDER_SITE_OTHER): Payer: BC Managed Care – PPO

## 2013-07-18 DIAGNOSIS — Z23 Encounter for immunization: Secondary | ICD-10-CM

## 2013-07-31 ENCOUNTER — Other Ambulatory Visit: Payer: Self-pay | Admitting: *Deleted

## 2013-07-31 MED ORDER — ZALEPLON 10 MG PO CAPS: 10.0000 mg | ORAL_CAPSULE | Freq: Every evening | ORAL | Status: DC | PRN

## 2013-09-15 ENCOUNTER — Other Ambulatory Visit (INDEPENDENT_AMBULATORY_CARE_PROVIDER_SITE_OTHER): Payer: BC Managed Care – PPO

## 2013-09-15 DIAGNOSIS — Z0000 Encounter for general adult medical examination without abnormal findings: Secondary | ICD-10-CM

## 2013-09-15 LAB — HEPATIC FUNCTION PANEL
Alkaline Phosphatase: 62 U/L (ref 39–117)
Bilirubin, Direct: 0.2 mg/dL (ref 0.0–0.3)
Total Bilirubin: 0.8 mg/dL (ref 0.3–1.2)
Total Protein: 6.9 g/dL (ref 6.0–8.3)

## 2013-09-15 LAB — CBC WITH DIFFERENTIAL/PLATELET
Basophils Absolute: 0 10*3/uL (ref 0.0–0.1)
Basophils Relative: 0.6 % (ref 0.0–3.0)
Eosinophils Absolute: 0.2 10*3/uL (ref 0.0–0.7)
Hemoglobin: 15.8 g/dL (ref 13.0–17.0)
Lymphocytes Relative: 36.4 % (ref 12.0–46.0)
MCHC: 34.5 g/dL (ref 30.0–36.0)
MCV: 95.2 fl (ref 78.0–100.0)
Monocytes Absolute: 0.5 10*3/uL (ref 0.1–1.0)
Neutrophils Relative %: 45.7 % (ref 43.0–77.0)
Platelets: 193 10*3/uL (ref 150.0–400.0)
RBC: 4.81 Mil/uL (ref 4.22–5.81)
RDW: 13.2 % (ref 11.5–14.6)

## 2013-09-15 LAB — POCT URINALYSIS DIPSTICK
Bilirubin, UA: NEGATIVE
Blood, UA: NEGATIVE
Glucose, UA: NEGATIVE
Ketones, UA: NEGATIVE
Leukocytes, UA: NEGATIVE
Nitrite, UA: NEGATIVE
pH, UA: 6.5

## 2013-09-15 LAB — BASIC METABOLIC PANEL
BUN: 15 mg/dL (ref 6–23)
CO2: 29 mEq/L (ref 19–32)
Calcium: 9.3 mg/dL (ref 8.4–10.5)
Chloride: 104 mEq/L (ref 96–112)
Creatinine, Ser: 0.7 mg/dL (ref 0.4–1.5)
Glucose, Bld: 94 mg/dL (ref 70–99)
Sodium: 141 mEq/L (ref 135–145)

## 2013-09-15 LAB — LIPID PANEL
Cholesterol: 196 mg/dL (ref 0–200)
HDL: 51.3 mg/dL (ref >39.00)
Total CHOL/HDL Ratio: 4
VLDL: 36.2 mg/dL (ref 0.0–40.0)

## 2013-09-15 LAB — PSA: PSA: 1.74 ng/mL (ref 0.10–4.00)

## 2013-09-22 ENCOUNTER — Encounter: Payer: BC Managed Care – PPO | Admitting: Internal Medicine

## 2013-10-05 ENCOUNTER — Encounter: Payer: Self-pay | Admitting: Gastroenterology

## 2013-10-26 ENCOUNTER — Other Ambulatory Visit: Payer: Self-pay | Admitting: Internal Medicine

## 2013-11-20 ENCOUNTER — Ambulatory Visit (INDEPENDENT_AMBULATORY_CARE_PROVIDER_SITE_OTHER): Payer: BC Managed Care – PPO | Admitting: Internal Medicine

## 2013-11-20 ENCOUNTER — Encounter: Payer: Self-pay | Admitting: Internal Medicine

## 2013-11-20 VITALS — BP 120/80 | HR 72 | Temp 98.3°F | Resp 16 | Ht 68.5 in | Wt 186.0 lb

## 2013-11-20 DIAGNOSIS — I10 Essential (primary) hypertension: Secondary | ICD-10-CM

## 2013-11-20 DIAGNOSIS — M23302 Other meniscus derangements, unspecified lateral meniscus, unspecified knee: Secondary | ICD-10-CM

## 2013-11-20 DIAGNOSIS — Z0000 Encounter for general adult medical examination without abnormal findings: Secondary | ICD-10-CM

## 2013-11-20 DIAGNOSIS — M23209 Derangement of unspecified meniscus due to old tear or injury, unspecified knee: Secondary | ICD-10-CM

## 2013-11-20 MED ORDER — NEBIVOLOL HCL 5 MG PO TABS: 5.0000 mg | ORAL_TABLET | Freq: Every day | ORAL | Status: DC

## 2013-11-20 NOTE — Progress Notes (Signed)
Subjective:    Patient ID: Clinton Newton, male    DOB: 1951-03-20, 63 y.o.   MRN: [id number]  HPI Pain with kneeling and flexing. Possible meniscal tears  CPX Heart rate issues on the atenolol cannot get heart rate up with exercize  Review of Systems  Constitutional: Negative for fever and fatigue.  HENT: Negative for congestion, hearing loss and postnasal drip.   Eyes: Negative for discharge, redness and visual disturbance.  Respiratory: Negative for cough, shortness of breath and wheezing.   Cardiovascular: Negative for leg swelling.  Gastrointestinal: Negative for abdominal pain, constipation and abdominal distention.  Genitourinary: Negative for urgency and frequency.  Musculoskeletal: Negative for arthralgias, joint swelling and neck pain.  Skin: Negative for color change and rash.  Neurological: Negative for weakness and light-headedness.  Hematological: Negative for adenopathy.  Psychiatric/Behavioral: Negative for behavioral problems.       Past Medical History  Diagnosis Date  . Hyperlipidemia   . Hypertension   . Colon polyps     History   Social History  . Marital Status: Married    Spouse Name: N/A    Number of Children: N/A  . Years of Education: N/A   Occupational History  . Not on file.   Social History Main Topics  . Smoking status: Never Smoker   . Smokeless tobacco: Not on file  . Alcohol Use: Yes  . Drug Use: No  . Sexual Activity: Yes   Other Topics Concern  . Not on file   Social History Narrative  . No narrative on file    Past Surgical History  Procedure Laterality Date  . Appendectomy      Family History  Problem Relation Age of Onset  . Heart disease Father     No Known Allergies  Current Outpatient Prescriptions on File Prior to Visit  Medication Sig Dispense Refill  . amLODipine-atorvastatin (CADUET) 5-20 MG per tablet TAKE 1 TABLET DAILY  90 tablet  3  . aspirin 81 MG EC tablet Take 81 mg by mouth daily.        .  ASTEPRO 0.15 % SOLN USE 1 SPRAY NASALLY TWICE  DAILY  60 mL  3  . atenolol (TENORMIN) 50 MG tablet Take 1 tablet (50 mg total) by mouth daily.  30 tablet  1  . hydrocortisone (ANUSOL-HC) 25 MG suppository Place 1 suppository (25 mg total) rectally 2 (two) times daily.  12 suppository  1  . ibuprofen (ADVIL,MOTRIN) 800 MG tablet Take 800 mg by mouth daily as needed.        . Multiple Vitamin (MULTIVITAMIN) tablet Take 1 tablet by mouth daily.        Marland Kitchen NASONEX 50 MCG/ACT nasal spray USE 2 SPRAYS NASALLY DAILY  17 g  2  . sildenafil (VIAGRA) 100 MG tablet TAKE AS DIRECTED  24 tablet  1  . triamcinolone (NASACORT AQ) 55 MCG/ACT nasal inhaler 2 sprays by Nasal route daily as needed.        . zaleplon (SONATA) 10 MG capsule Take 1 capsule (10 mg total) by mouth at bedtime as needed.  30 capsule  1   No current facility-administered medications on file prior to visit.    BP 120/80  Pulse 72  Temp(Src) 98.3 F (36.8 C)  Resp 16  Ht 5' 8.5" (1.74 m)  Wt 186 lb (84.369 kg)  BMI 27.87 kg/m2    Objective:   Physical Exam  Nursing note and vitals reviewed. Constitutional: He is oriented  to person, place, and time. He appears well-developed and well-nourished.  HENT:  Head: Normocephalic and atraumatic.  Eyes: Conjunctivae are normal. Pupils are equal, round, and reactive to light.  Neck: Normal range of motion. Neck supple.  Cardiovascular: Normal rate and regular rhythm.   Pulmonary/Chest: Effort normal and breath sounds normal.  Abdominal: Soft. Bowel sounds are normal.  Musculoskeletal: He exhibits edema and tenderness.  Neurological: He is alert and oriented to person, place, and time.  Skin: Skin is warm and dry.  Psychiatric: He has a normal mood and affect. His behavior is normal.          Assessment & Plan:   Patient presents for yearly preventative medicine examination.   all immunizations and health maintenance protocols were reviewed with the patient and they are up to  date with these protocols.   screening laboratory values were reviewed with the patient including screening of hyperlipidemia PSA renal function and hepatic function.   There medications past medical history social history problem list and allergies were reviewed in detail.   Goals were established with regard to weight loss exercise diet in compliance with medications  Refer to ortho for knees

## 2013-11-20 NOTE — Progress Notes (Signed)
Pre visit review using our clinic review tool, if applicable. No additional management support is needed unless otherwise documented below in the visit note. 

## 2013-11-20 NOTE — Patient Instructions (Signed)
The patient is instructed to continue all medications as prescribed. Schedule followup with check out clerk upon leaving the clinic  

## 2013-11-21 ENCOUNTER — Ambulatory Visit (AMBULATORY_SURGERY_CENTER): Payer: BC Managed Care – PPO

## 2013-11-21 VITALS — Ht 68.0 in | Wt 178.0 lb

## 2013-11-21 DIAGNOSIS — Z860100 Personal history of colon polyps, unspecified: Secondary | ICD-10-CM

## 2013-11-21 MED ORDER — MOVIPREP 100 G PO SOLR: 1.0000 | Freq: Once | ORAL | Status: DC

## 2013-11-28 ENCOUNTER — Encounter: Payer: Self-pay | Admitting: Gastroenterology

## 2013-11-29 ENCOUNTER — Telehealth: Payer: Self-pay | Admitting: Internal Medicine

## 2013-11-29 NOTE — Telephone Encounter (Signed)
Relevant patient education assigned to patient using Emmi. ° °

## 2013-12-04 ENCOUNTER — Telehealth: Payer: Self-pay | Admitting: Gastroenterology

## 2013-12-04 NOTE — Telephone Encounter (Signed)
OK for no charge this time but not likely to offer no charge. In future please specify the patient symptoms and exactly how high the temp.

## 2013-12-05 ENCOUNTER — Encounter: Payer: BC Managed Care – PPO | Admitting: Gastroenterology

## 2013-12-22 ENCOUNTER — Encounter: Payer: Self-pay | Admitting: Internal Medicine

## 2013-12-22 ENCOUNTER — Other Ambulatory Visit: Payer: Self-pay | Admitting: *Deleted

## 2013-12-22 MED ORDER — AZITHROMYCIN 250 MG PO TABS: ORAL_TABLET | ORAL | Status: DC

## 2014-01-30 ENCOUNTER — Encounter: Payer: Self-pay | Admitting: Gastroenterology

## 2014-01-30 ENCOUNTER — Ambulatory Visit (AMBULATORY_SURGERY_CENTER): Payer: BC Managed Care – PPO | Admitting: Gastroenterology

## 2014-01-30 VITALS — BP 136/89 | HR 62 | Temp 98.0°F | Resp 15 | Ht 68.0 in | Wt 178.0 lb

## 2014-01-30 DIAGNOSIS — D126 Benign neoplasm of colon, unspecified: Secondary | ICD-10-CM

## 2014-01-30 DIAGNOSIS — Z86010 Personal history of colonic polyps: Secondary | ICD-10-CM

## 2014-01-30 MED ORDER — SODIUM CHLORIDE 0.9 % IV SOLN: 500.0000 mL | INTRAVENOUS | Status: DC

## 2014-01-30 NOTE — Patient Instructions (Signed)
YOU HAD AN ENDOSCOPIC PROCEDURE TODAY AT THE [facility]: Refer to the procedure report that was given to you for any specific questions about what was found during the examination.  If the procedure report does not answer your questions, please call your gastroenterologist to clarify.  If you requested that your care partner not be given the details of your procedure findings, then the procedure report has been included in a sealed envelope for you to review at your convenience later.  YOU SHOULD EXPECT: Some feelings of bloating in the abdomen. Passage of more gas than usual.  Walking can help get rid of the air that was put into your GI tract during the procedure and reduce the bloating. If you had a lower endoscopy (such as a colonoscopy or flexible sigmoidoscopy) you may notice spotting of blood in your stool or on the toilet paper. If you underwent a bowel prep for your procedure, then you may not have a normal bowel movement for a few days.  DIET: Your first meal following the procedure should be a light meal and then it is ok to progress to your normal diet.  A half-sandwich or bowl of soup is an example of a good first meal.  Heavy or fried foods are harder to digest and may make you feel nauseous or bloated.  Likewise meals heavy in dairy and vegetables can cause extra gas to form and this can also increase the bloating.  Drink plenty of fluids but you should avoid alcoholic beverages for 24 hours.  ACTIVITY: Your care partner should take you home directly after the procedure.  You should plan to take it easy, moving slowly for the rest of the day.  You can resume normal activity the day after the procedure however you should NOT DRIVE or use heavy machinery for 24 hours (because of the sedation medicines used during the test).    SYMPTOMS TO REPORT IMMEDIATELY: A gastroenterologist can be reached at any hour.  During normal business hours, 8:30 AM to 5:00 PM Monday through Friday,  call [phone].  After hours and on weekends, please call the GI answering service at [phone] who will take a message and have the physician on call contact you.   Following lower endoscopy (colonoscopy or flexible sigmoidoscopy):  Excessive amounts of blood in the stool  Significant tenderness or worsening of abdominal pains  Swelling of the abdomen that is new, acute  Fever of 100F or higher  FOLLOW UP: If any biopsies were taken you will be contacted by phone or by letter within the next 1-3 weeks.  Call your gastroenterologist if you have not heard about the biopsies in 3 weeks.  Our staff will call the home number listed on your records the next business day following your procedure to check on you and address any questions or concerns that you may have at that time regarding the information given to you following your procedure. This is a courtesy call and so if there is no answer at the home number and we have not heard from you through the emergency physician on call, we will assume that you have returned to your regular daily activities without incident.  SIGNATURES/CONFIDENTIALITY: You and/or your care partner have signed paperwork which will be entered into your electronic medical record.  These signatures attest to the fact that that the information above on your After Visit Summary has been reviewed and is understood.  Full responsibility of the confidentiality of this   discharge information lies with you and/or your care-partner.  NO ASPIRIN, ASPIRIN CONTAINING, NSAIDS (MOTRIN, IBUPROFEN, ADVIL, ALEVE) FOR 2 WEEKS- TYLENOL IS OK   Await pathology  Please read over handouts about polyps, diverticulosis, hemorrhoids and high fiber diet  Repeat colonoscopy in 3 years

## 2014-01-30 NOTE — Progress Notes (Signed)
Dr. Fuller Plan removed cecal polyp hot snare in 1 piece.  He had to cut the polyp in pieces with the snare to suck the polyp up in the scope channel to retrieve it. Maw

## 2014-01-30 NOTE — Progress Notes (Signed)
Lidocaine-40mg IV prior to Propofol InductionPropofol given over incremental dosages 

## 2014-01-30 NOTE — Op Note (Signed)
[place]  [address]. [place], [address]   COLONOSCOPY PROCEDURE REPORT PATIENT: Clinton Newton, Clinton Newton  MR#: [id number] BIRTHDATE: 1950/10/29 , 62  yrs. old GENDER: Male ENDOSCOPIST: Ladene Artist, MD, [facility] PROCEDURE DATE:  01/30/2014 PROCEDURE:   Colonoscopy with biopsy and snare polypectomy First Screening Colonoscopy - Avg.  risk and is 50 yrs.  old or older - No.  Prior Negative Screening - Now for repeat screening. N/A  History of Adenoma - Now for follow-up colonoscopy & has been > or = to 3 yrs.  Yes hx of adenoma.  Has been 3 or more years since last colonoscopy.  Polyps Removed Today? Yes. ASA CLASS:   Class II INDICATIONS:Patient's personal history of adenomatous colon polyps-TVA with HGD in 2011. MEDICATIONS: MAC sedation, administered by CRNA and propofol (Diprivan) 250mg  IV DESCRIPTION OF PROCEDURE:   After the risks benefits and alternatives of the procedure were thoroughly explained, informed consent was obtained.  A digital rectal exam revealed no abnormalities of the rectum.   The [id number]  endoscope was introduced through the anus and advanced to the cecum, which was identified by both the appendix and ileocecal valve. No adverse events experienced.   The quality of the prep was good, using MoviPrep  The instrument was then slowly withdrawn as the colon was fully examined.  COLON FINDINGS: A sessile polyp measuring 1.2 cm in size was found at the cecum.  A polypectomy was performed using snare cautery. The resection was complete and the polyp tissue was completely retrieved.   A sessile polyp measuring 3 mm in size was found at the cecum.  A polypectomy was performed with cold forceps.  The resection was complete and the polyp tissue was completely retrieved.   A sessile polyp measuring 8 mm in size was found in the transverse colon.  A polypectomy was performed using snare cautery.  The resection was complete and the polyp tissue  was completely retrieved.   Moderate diverticulosis was noted in the sigmoid colon.  The colon was otherwise normal.  There was no diverticulosis, inflammation, polyps or cancers unless previously stated.  Retroflexed views revealed small internal hemorrhoids. The time to cecum=1 minutes 38 seconds.  Withdrawal time=11 minutes 42 seconds.  The scope was withdrawn and the procedure completed. COMPLICATIONS: There were no complications. ENDOSCOPIC IMPRESSION: 1.   Sessile polyp measuring 1.2 cm at the cecum; polypectomy performed using snare cautery 2.   Sessile polyp measuring 3 mm at the cecum; polypectomy performed with cold forceps 3.   Sessile polyp measuring 8 mm in the transverse colon; polypectomy performed using snare cautery 4.   Moderate diverticulosis in the sigmoid colon 5.   Small internal hemorrhoids RECOMMENDATIONS: 1.  Hold aspirin, aspirin products, and anti-inflammatory medication for 2 weeks. 2.  Await pathology results 3.  High fiber diet with liberal fluid intake. 4.  Repeat Colonoscopy in 3 years. eSigned:  Ladene Artist, MD, [facility] 01/30/2014 10:30 AM

## 2014-01-30 NOTE — Progress Notes (Signed)
Called to room to assist during endoscopic procedure.  Patient ID and intended procedure confirmed with present staff. Received instructions for my participation in the procedure from the performing physician.  

## 2014-01-31 ENCOUNTER — Telehealth: Payer: Self-pay

## 2014-01-31 NOTE — Telephone Encounter (Signed)
Left a message at [phone] for the pt to call us back if any questions or concerns. maw

## 2014-02-05 ENCOUNTER — Encounter: Payer: Self-pay | Admitting: Gastroenterology

## 2014-02-27 ENCOUNTER — Encounter: Payer: Self-pay | Admitting: Family Medicine

## 2014-02-27 DIAGNOSIS — I10 Essential (primary) hypertension: Secondary | ICD-10-CM

## 2014-02-27 MED ORDER — AMLODIPINE-ATORVASTATIN 5-20 MG PO TABS: ORAL_TABLET | ORAL | Status: DC

## 2014-02-27 MED ORDER — NEBIVOLOL HCL 5 MG PO TABS: 5.0000 mg | ORAL_TABLET | Freq: Every day | ORAL | Status: DC

## 2014-03-14 ENCOUNTER — Encounter: Payer: Self-pay | Admitting: Family Medicine

## 2014-03-14 ENCOUNTER — Ambulatory Visit (INDEPENDENT_AMBULATORY_CARE_PROVIDER_SITE_OTHER): Payer: BC Managed Care – PPO | Admitting: Family Medicine

## 2014-03-14 VITALS — BP 120/82 | HR 59 | Temp 98.6°F | Ht 68.0 in | Wt 176.0 lb

## 2014-03-14 DIAGNOSIS — E785 Hyperlipidemia, unspecified: Secondary | ICD-10-CM

## 2014-03-14 DIAGNOSIS — Z860100 Personal history of colon polyps, unspecified: Secondary | ICD-10-CM

## 2014-03-14 DIAGNOSIS — G4700 Insomnia, unspecified: Secondary | ICD-10-CM | POA: Insufficient documentation

## 2014-03-14 DIAGNOSIS — M25569 Pain in unspecified knee: Secondary | ICD-10-CM | POA: Insufficient documentation

## 2014-03-14 DIAGNOSIS — I10 Essential (primary) hypertension: Secondary | ICD-10-CM

## 2014-03-14 MED ORDER — ZALEPLON 10 MG PO CAPS: 10.0000 mg | ORAL_CAPSULE | Freq: Every evening | ORAL | Status: DC | PRN

## 2014-03-14 NOTE — Patient Instructions (Addendum)
-  can stop the bystolic if you wish   -follow up in August for physical, labs and to recheck blood pressure - please come fasting but drink plenty of water

## 2014-03-14 NOTE — Progress Notes (Signed)
Pre visit review using our clinic review tool, if applicable. No additional management support is needed unless otherwise documented below in the visit note. 

## 2014-03-14 NOTE — Progress Notes (Addendum)
No chief complaint on file.   HPI:  Clinton Newton is here to establish care.  Last PCP and physical:  Has the following chronic problems and concerns today:  Patient Active Problem List   Diagnosis Date Noted  . Insomnia 03/14/2014  . Knee pain - sees ortho 03/14/2014  . Difficulty in urination 10/08/2012  . HYPERLIPIDEMIA 08/31/2007  . HYPERTENSION 08/31/2007   HTN -on caduet (amlodipine-atorvastatin, nebivolol) -stable -has been working on exercise and diet -reports wants to stop the bystolic as heart rate has been low with this -denies hx CAD, stroke, PVD, arrythmia, CP, SOB, swelling, palpitation  Insomnia: -when travels, uses sonata occ -denies: daytime somnelence  ROS: See pertinent positives and negatives per HPI.  Past Medical History  Diagnosis Date  . Hyperlipidemia   . Hypertension   . Colon polyps     Family History  Problem Relation Age of Onset  . Heart disease Father   . Colon cancer Neg Hx   . Pancreatic cancer Neg Hx   . Rectal cancer Neg Hx   . Stomach cancer Neg Hx     History   Social History  . Marital Status: Married    Spouse Name: N/A    Number of Children: N/A  . Years of Education: N/A   Social History Main Topics  . Smoking status: Never Smoker   . Smokeless tobacco: Never Used  . Alcohol Use: 4.2 oz/week    7 Glasses of wine per week  . Drug Use: No  . Sexual Activity: Yes   Other Topics Concern  . None   Social History Narrative   Work or School: retiring soon, Insurance underwriter Situation: lives with wife      Spiritual Beliefs:       Lifestyle: working out on a regular basis; diet is healthy             Current outpatient prescriptions:aspirin 81 MG EC tablet, Take 81 mg by mouth daily.  , Disp: , Rfl: ;  ibuprofen (ADVIL,MOTRIN) 800 MG tablet, Take 800 mg by mouth daily as needed.  , Disp: , Rfl: ;  Multiple Vitamin (MULTIVITAMIN) tablet, Take 1 tablet by mouth daily.  , Disp: , Rfl: ;  triamcinolone  (NASACORT AQ) 55 MCG/ACT nasal inhaler, 2 sprays by Nasal route daily as needed.  , Disp: , Rfl:  zaleplon (SONATA) 10 MG capsule, Take 1 capsule (10 mg total) by mouth at bedtime as needed., Disp: 10 capsule, Rfl: 0;  amLODipine-atorvastatin (CADUET) 5-20 MG per tablet, TAKE 1 TABLET DAILY, Disp: 90 tablet, Rfl: 1;  hydrocortisone (ANUSOL-HC) 25 MG suppository, Place 1 suppository (25 mg total) rectally 2 (two) times daily., Disp: 12 suppository, Rfl: 1 nebivolol (BYSTOLIC) 5 MG tablet, Take 1 tablet (5 mg total) by mouth daily., Disp: 90 tablet, Rfl: 3  EXAM:  Filed Vitals:   03/14/14 1114  BP: 120/82  Pulse: 59  Temp: 98.6 F (37 C)    Body mass index is 26.77 kg/(m^2).  GENERAL: vitals reviewed and listed above, alert, oriented, appears well hydrated and in no acute distress  HEENT: atraumatic, conjunttiva clear, no obvious abnormalities on inspection of external nose and ears  NECK: no obvious masses on inspection  LUNGS: clear to auscultation bilaterally, no wheezes, rales or rhonchi, good air movement  CV: HRRR, no peripheral edema  MS: moves all extremities without noticeable abnormality  PSYCH: pleasant and cooperative, no obvious depression or anxiety  ASSESSMENT  AND PLAN:  Discussed the following assessment and plan:  Insomnia - Plan: zaleplon (SONATA) 10 MG capsule  COLONIC POLYPS, HX OF  HYPERTENSION  HYPERLIPIDEMIA  Knee pain - sees ortho -We reviewed the PMH, PSH, FH, SH, Meds and Allergies. -We provided refills for any medications we will prescribe as needed. -We addressed current concerns per orders and patient instructions. -We have asked for records for pertinent exams, studies, vaccines and notes from previous providers. -We have advised patient to follow up per instructions below.   -Patient advised to return or notify a doctor immediately if symptoms worsen or persist or new concerns arise.  Patient Instructions  -can stop the bystolic if you  wish   -follow up in August for physical, labs and to recheck blood pressure - please come fasting but drink plenty of water       [name], [place]

## 2014-04-13 ENCOUNTER — Encounter: Payer: Self-pay | Admitting: Family Medicine

## 2014-04-13 ENCOUNTER — Other Ambulatory Visit: Payer: Self-pay | Admitting: Internal Medicine

## 2014-04-13 MED ORDER — AMLODIPINE-ATORVASTATIN 5-20 MG PO TABS: ORAL_TABLET | ORAL | Status: DC

## 2014-05-21 ENCOUNTER — Encounter: Payer: Self-pay | Admitting: Family Medicine

## 2014-05-21 DIAGNOSIS — I10 Essential (primary) hypertension: Secondary | ICD-10-CM

## 2014-05-21 MED ORDER — NEBIVOLOL HCL 5 MG PO TABS: 5.0000 mg | ORAL_TABLET | Freq: Every day | ORAL | Status: DC

## 2014-05-21 NOTE — Telephone Encounter (Signed)
Bystolic sent. It looks like the caduet was sent in June. See you in August.

## 2014-06-12 ENCOUNTER — Encounter: Payer: Self-pay | Admitting: Family Medicine

## 2014-06-14 ENCOUNTER — Encounter: Payer: Self-pay | Admitting: Family Medicine

## 2014-06-14 ENCOUNTER — Ambulatory Visit (INDEPENDENT_AMBULATORY_CARE_PROVIDER_SITE_OTHER): Payer: BC Managed Care – PPO | Admitting: Family Medicine

## 2014-06-14 VITALS — BP 128/82 | HR 74 | Temp 98.7°F | Ht 68.0 in | Wt 174.5 lb

## 2014-06-14 DIAGNOSIS — I10 Essential (primary) hypertension: Secondary | ICD-10-CM

## 2014-06-14 DIAGNOSIS — K5733 Diverticulitis of large intestine without perforation or abscess with bleeding: Secondary | ICD-10-CM

## 2014-06-14 DIAGNOSIS — E785 Hyperlipidemia, unspecified: Secondary | ICD-10-CM

## 2014-06-14 MED ORDER — AMLODIPINE-ATORVASTATIN 5-20 MG PO TABS: ORAL_TABLET | ORAL | Status: DC

## 2014-06-14 MED ORDER — METRONIDAZOLE 500 MG PO TABS: 500.0000 mg | ORAL_TABLET | Freq: Three times a day (TID) | ORAL | Status: DC

## 2014-06-14 MED ORDER — NEBIVOLOL HCL 5 MG PO TABS: 2.5000 mg | ORAL_TABLET | Freq: Every day | ORAL | Status: DC

## 2014-06-14 NOTE — Progress Notes (Signed)
Pre visit review using our clinic review tool, if applicable. No additional management support is needed unless otherwise documented below in the visit note. 

## 2014-06-14 NOTE — Progress Notes (Signed)
No chief complaint on file.   HPI:  Follow up:  HTN: -had wanted to stop bystolic last visited, then opted to continue -current meds: amlodipine (5mg  in caduet), bystolic 5 mg (this was decreased last visit - one half tablet) -denies: CP, SOB, palpitations, swelling  HLD: -meds: atovastatin in caduet -stable, advised physical/labs last visit  Insomnia: -occ, rare with travel, uses sonata rarely -understands risks, I advised when he established care against use and trial of sleep hygiene and safer otions, but he prefers despite risks to use on occasion  - advised if escalating use to see psych  Diverticulitis: -dx at urology appt - he thought it was a kidney stone -on treatment and doing well and feeling much better, but still mild TTP in LLQ, his Gastroenterologist is aware -denies: fevers, vomiting, diarrhea, hematochezia or melena   ROS: See pertinent positives and negatives per HPI.  Past Medical History  Diagnosis Date  . Hyperlipidemia   . Hypertension   . Colon polyps     Past Surgical History  Procedure Laterality Date  . Appendectomy    . Colonoscopy      Family History  Problem Relation Age of Onset  . Heart disease Father   . Colon cancer Neg Hx   . Pancreatic cancer Neg Hx   . Rectal cancer Neg Hx   . Stomach cancer Neg Hx     History   Social History  . Marital Status: Married    Spouse Name: N/A    Number of Children: N/A  . Years of Education: N/A   Social History Main Topics  . Smoking status: Never Smoker   . Smokeless tobacco: Never Used  . Alcohol Use: 4.2 oz/week    7 Glasses of wine per week  . Drug Use: No  . Sexual Activity: Yes   Other Topics Concern  . 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  , Disp: , Rfl: ;  ciprofloxacin (CIPRO) 500 MG tablet, , Disp: , Rfl: ;  hydrocortisone (ANUSOL-HC) 25 MG suppository, Place 1 suppository (25 mg total) rectally 2 (two) times daily., Disp: 12 suppository, Rfl: 1 metroNIDAZOLE (FLAGYL) 500 MG tablet, Take 1 tablet (500 mg total) by mouth 3 (three) times daily., Disp: 21 tablet, Rfl: 0;  Multiple Vitamin (MULTIVITAMIN) tablet, Take 1 tablet by mouth daily.  , Disp: , Rfl: ;  nebivolol (BYSTOLIC) 5 MG tablet, Take 0.5 tablets (2.5 mg total) by mouth daily., Disp: 45 tablet, Rfl: 3;  triamcinolone (NASACORT AQ) 55 MCG/ACT nasal inhaler, 2 sprays by Nasal route daily as needed.  , Disp: , Rfl:   EXAM:  Filed Vitals:   06/14/14 1334  BP: 128/82  Pulse: 74  Temp: 98.7 F (37.1 C)    Body mass index is 26.54 kg/(m^2).  GENERAL: vitals reviewed and listed above, alert, oriented, appears well hydrated and in no acute distress  HEENT: atraumatic, conjunttiva clear, no obvious abnormalities on inspection of external nose and ears  NECK: no obvious masses on  inspection  LUNGS: clear to auscultation bilaterally, no wheezes, rales or rhonchi, good air movement  CV: HRRR, no peripheral edema  MS: moves all extremities without noticeable abnormality  PSYCH: pleasant and cooperative, no obvious depression or anxiety  ASSESSMENT AND PLAN:  Discussed the following assessment and plan:  Essential hypertension - Plan: amLODipine-atorvastatin (CADUET) 5-20 MG per tablet, nebivolol (BYSTOLIC) 5 MG tablet  Diverticulitis large intestine w/o perforation or abscess w/bleeding - Plan: metroNIDAZOLE (FLAGYL) 500 MG tablet  HYPERLIPIDEMIA  -diverticulitis - recovering but still with mild LLQ pain, add flagyl, advised if worsening or any persistent symptoms after finishing abx to notify his  gastroenterologist -he now wants to wean off of his bystolic and will monitor bp -follow up for physical exam  -Patient advised to return or notify a doctor immediately if symptoms worsen or persist or new concerns arise.  Patient Instructions  -As we discussed, we have prescribed a new medication (FLAGYL for the diverticulitis) for you at this appointment. We discussed the common and serious potential adverse effects of this medication and you can review these and more with the pharmacist when you pick up your medication.  Please follow the instructions for use carefully and notify us immediately if you have any problems taking this medication.  -if diverticulitis not completely resolving contact your gastroenterologist  -wean off bystolic and monitor blood pressure        KIM, HANNAH R.

## 2014-06-14 NOTE — Patient Instructions (Signed)
-  As we discussed, we have prescribed a new medication (FLAGYL for the diverticulitis) for you at this appointment. We discussed the common and serious potential adverse effects of this medication and you can review these and more with the pharmacist when you pick up your medication.  Please follow the instructions for use carefully and notify us immediately if you have any problems taking this medication.  -if diverticulitis not completely resolving contact your gastroenterologist  -wean off bystolic and monitor blood pressure

## 2014-06-25 ENCOUNTER — Encounter: Payer: Self-pay | Admitting: Gastroenterology

## 2014-08-10 ENCOUNTER — Other Ambulatory Visit: Payer: Self-pay

## 2014-08-28 ENCOUNTER — Encounter: Payer: Self-pay | Admitting: Family Medicine

## 2014-08-28 ENCOUNTER — Telehealth: Payer: Self-pay | Admitting: *Deleted

## 2014-08-28 NOTE — Telephone Encounter (Signed)
Can you call [facility]. I advise if he feels this is diverticulitis that he contact his gastroenterologist for recs and not wait.

## 2014-08-28 NOTE — Telephone Encounter (Signed)
I left a message for the pt to return my call. 

## 2014-08-29 ENCOUNTER — Ambulatory Visit (INDEPENDENT_AMBULATORY_CARE_PROVIDER_SITE_OTHER): Payer: BC Managed Care – PPO | Admitting: Nurse Practitioner

## 2014-08-29 ENCOUNTER — Encounter: Payer: Self-pay | Admitting: Nurse Practitioner

## 2014-08-29 ENCOUNTER — Telehealth: Payer: Self-pay | Admitting: *Deleted

## 2014-08-29 ENCOUNTER — Telehealth: Payer: Self-pay | Admitting: Gastroenterology

## 2014-08-29 DIAGNOSIS — K5733 Diverticulitis of large intestine without perforation or abscess with bleeding: Secondary | ICD-10-CM

## 2014-08-29 MED ORDER — METRONIDAZOLE 500 MG PO TABS: 500.0000 mg | ORAL_TABLET | Freq: Three times a day (TID) | ORAL | Status: DC

## 2014-08-29 MED ORDER — CIPROFLOXACIN HCL 500 MG PO TABS: 500.0000 mg | ORAL_TABLET | Freq: Two times a day (BID) | ORAL | Status: DC

## 2014-08-29 NOTE — Patient Instructions (Addendum)
We will obtain CT report from [facility]  We have sent the following medications to your pharmacy for you to pick up at your convenience: Cipro 500 mg, please take one tablet by mouth two times daily for ten days  Flagyl 500 mg, please take one tablet by mouth three times daily for ten days

## 2014-08-29 NOTE — Telephone Encounter (Signed)
Patient informed. 

## 2014-08-29 NOTE — Telephone Encounter (Signed)
Patient reports a history of diverticulitis.  He c/o several day history of LLQ pain and tenderness.  He will come in today and see Clinton Newton RNP at 2:30

## 2014-08-30 ENCOUNTER — Encounter: Payer: Self-pay | Admitting: Nurse Practitioner

## 2014-08-30 DIAGNOSIS — K5732 Diverticulitis of large intestine without perforation or abscess without bleeding: Secondary | ICD-10-CM | POA: Insufficient documentation

## 2014-08-30 DIAGNOSIS — K5733 Diverticulitis of large intestine without perforation or abscess with bleeding: Secondary | ICD-10-CM | POA: Insufficient documentation

## 2014-08-30 NOTE — Progress Notes (Addendum)
     History of Present Illness:   Patient is a 63 year old male known to Dr. Fuller Plan. He has a history of adenomatous colon polyps ( April 2015). Patient comes in today for evaluation of left lower quadrant pain. Several months ago patient began having lower abdominal pain which he thought may be a kidney stone. Patient was seen by urology, apparently a CT scan showed diverticulitis instead. Patient was treated with a course of Cipro/ Flagyl and symptoms resolved. Patient comes in today with recurrent left lower quadrant discomfort unrelated to meals or defecation. He had some leftover Flagyl at home and started to couple of days ago. Pain is already starting to improve. He's had some mild chills but normal temp. Pelvis are normal. No urinary symptoms. Current Medications, Allergies, Past Medical History, Past Surgical History, Family History and Social History were reviewed in [employer] record.  SPhysical Exam: General: Pleasant, well developed , white male in no acute distress Head: Normocephalic and atraumatic Eyes:  sclerae anicteric, conjunctiva pink  Ears: Normal auditory acuity Lungs: Clear throughout to auscultation Heart: Regular rate and rhythm Abdomen: Soft, non distended, mild-moderate tenderness of mid lower abdomen. No masses, no hepatomegaly. Normal bowel sounds Musculoskeletal: Symmetrical with no gross deformities  Extremities: No edema  Neurological: Alert oriented x 4, grossly nonfocal Psychological:  Alert and cooperative. Normal mood and affect  Assessment and Recommendations:   63 year old male with left lower quadrant pain. Patient was treated several months ago for diverticulitis (found on CT scan done at urology's office). Patient does have known diverticulosis but I would like to get the CT scan from urology's office to have clear documentation of diverticulitis. Records requested. Will treat with 10 day course of Cipro and Flagyl. Recommend  clear liquids until pain significantly improved. He declines pain medication. We will contact patient after scan reviewed. He knows to call us back ASAP for worsening pain and/or fevers.   Addendum 08/30/14- CT scan of the abdomen and pelvis without contrast done at [facility] 06/06/14. Findings included distal descending/proximal sigmoid diverticulitis without perforation or abscess.

## 2014-08-30 NOTE — Progress Notes (Signed)
Reviewed and agree with management plan.  Valeria Krisko T. Alycen Mack, MD FACG 

## 2014-11-21 ENCOUNTER — Ambulatory Visit (INDEPENDENT_AMBULATORY_CARE_PROVIDER_SITE_OTHER): Payer: BLUE CROSS/BLUE SHIELD | Admitting: Family Medicine

## 2014-11-21 ENCOUNTER — Encounter: Payer: Self-pay | Admitting: Family Medicine

## 2014-11-21 VITALS — BP 126/70 | HR 70 | Temp 97.8°F | Ht 68.75 in | Wt 170.7 lb

## 2014-11-21 DIAGNOSIS — E785 Hyperlipidemia, unspecified: Secondary | ICD-10-CM

## 2014-11-21 DIAGNOSIS — I10 Essential (primary) hypertension: Secondary | ICD-10-CM

## 2014-11-21 DIAGNOSIS — G4700 Insomnia, unspecified: Secondary | ICD-10-CM

## 2014-11-21 DIAGNOSIS — Z0000 Encounter for general adult medical examination without abnormal findings: Secondary | ICD-10-CM

## 2014-11-21 LAB — BASIC METABOLIC PANEL
BUN: 16 mg/dL (ref 6–23)
CO2: 31 mEq/L (ref 19–32)
Calcium: 9.5 mg/dL (ref 8.4–10.5)
Chloride: 102 mEq/L (ref 96–112)
Creatinine, Ser: 0.72 mg/dL (ref 0.40–1.50)
GFR: 117.53 mL/min (ref >60.00)
GLUCOSE: 105 mg/dL — AB (ref 70–99)
Potassium: 4.6 mEq/L (ref 3.5–5.1)
SODIUM: 139 meq/L (ref 135–145)

## 2014-11-21 LAB — LIPID PANEL
CHOL/HDL RATIO: 3
Cholesterol: 196 mg/dL (ref 0–200)
HDL: 61 mg/dL (ref >39.00)
NONHDL: 135
Triglycerides: 213 mg/dL — ABNORMAL HIGH (ref 0.0–149.0)
VLDL: 42.6 mg/dL — AB (ref 0.0–40.0)

## 2014-11-21 LAB — LDL CHOLESTEROL, DIRECT: Direct LDL: 109 mg/dL

## 2014-11-21 NOTE — Patient Instructions (Signed)
BEFORE YOU LEAVE: -labs -schedule follow up in 6 months  -We have ordered labs or studies at this visit. It can take up to 1-2 weeks for results and processing. We will contact you with instructions IF your results are abnormal. Normal results will be released to your [facility]. If you have not heard from [place] or can not find your results in [facility] in 2 weeks please contact our office.  -PLEASE SIGN UP FOR MYCHART TODAY   We recommend the following healthy lifestyle measures: - eat a healthy diet consisting of lots of vegetables, fruits, beans, nuts, seeds, healthy meats such as white chicken and fish and whole grains.  - avoid fried foods, fast food, processed foods, sodas, red meet and other fattening foods.  - get a least 150 minutes of aerobic exercise per week.

## 2014-11-21 NOTE — Progress Notes (Signed)
Pre visit review using our clinic review tool, if applicable. No additional management support is needed unless otherwise documented below in the visit note. 

## 2014-11-21 NOTE — Progress Notes (Signed)
HPI:  Here for CPE:  -Concerns and/or follow up today:   HTN: -had wanted to stop bystolic last visited, then opted to continue -BP is in the 120s/70-80s on average at home -current meds: amlodipine (5mg  in caduet), bystolic 5 mg  -denies: CP, SOB, palpitations, swelling  HLD: -meds: atovastatin in caduet -stable -denies: cramps, cog decline  Insomnia: -occ, rare with travel, uses sonata rarely -understands risks, I advised when he established care against use and trial of sleep hygiene and safer otions, but he prefers despite risks to use on occasion - advised if escalating use to see psych  -Diet: variety of foods, balance and well rounded, larger portion sizes  -Exercise: getting regular exercise - 5 days or more per week  -Diabetes and Dyslipidemia Screening: FASTING labs today  -Hx of HTN: yes, treated  -Vaccines: UTD  -sexual activity: yes, male partner, no new partners  -wants STI testing, Hep C screening (if born 23-1964): no  -FH colon or prstate ca: see FH Last colon cancer screening: sees gastroenterologist, had in 2015 - due in 2018 due to polyps Last prostate ca screening: sees urologist, he is following the PSA  -Alcohol, Tobacco, drug use: see social history  Review of Systems - no fevers, unintentional weight loss, vision loss, hearing loss, chest pain, sob, hemoptysis, melena, hematochezia, hematuria, genital discharge, changing or concerning skin lesions, bleeding, bruising, loc, thoughts of self harm or SI  Past Medical History  Diagnosis Date  . Hyperlipidemia   . Hypertension   . Colon polyps   . Diverticulitis     Past Surgical History  Procedure Laterality Date  . Appendectomy    . Colonoscopy      Family History  Problem Relation Age of Onset  . Heart disease Father   . Colon cancer Neg Hx   . Pancreatic cancer Neg Hx   . Rectal cancer Neg Hx   . Stomach cancer Neg Hx     History   Social History  . Marital Status:  Married    Spouse Name: N/A    Number of Children: N/A  . Years of Education: N/A   Occupational History  . Retired    Social History Main Topics  . Smoking status: Never Smoker   . Smokeless tobacco: Never Used  . Alcohol Use: 4.2 oz/week    7 Glasses of wine per week  . Drug Use: No  . Sexual Activity: Yes   Other Topics Concern  . 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  amLODipine-atorvastatin (CADUET) 5-20 MG per tablet, TAKE 1 TABLET DAILY, Disp: 90 tablet, Rfl: 3 .  aspirin 81 MG EC tablet, Take 81 mg by mouth daily.  , Disp: , Rfl:  .  Flaxseed, Linseed, (FLAXSEED OIL PO), Take by mouth as directed., Disp: , Rfl:  .  Multiple Vitamin (MULTIVITAMIN) tablet, Take 1 tablet by mouth daily.  , Disp: , Rfl:  .  nebivolol (BYSTOLIC) 5 MG tablet, Take 0.5 tablets (2.5 mg total) by mouth daily., Disp: 45 tablet, Rfl: 3 .  triamcinolone (NASACORT AQ) 55  MCG/ACT nasal inhaler, 2 sprays by Nasal route daily as needed.  , Disp: , Rfl:   EXAM:  Filed Vitals:   11/21/14 0820  BP: 126/70  Pulse: 70  Temp: 97.8 F (36.6 C)  TempSrc: Oral  Height: 5' 8.75" (1.746 m)  Weight: 170 lb 11.2 oz (77.429 kg)    Estimated body mass index is 25.4 kg/(m^2) as calculated from the following:   Height as of this encounter: 5' 8.75" (1.746 m).   Weight as of this encounter: 170 lb 11.2 oz (77.429 kg).  GENERAL: vitals reviewed and listed below, alert, oriented, appears well hydrated and in no acute distress  HEENT: head atraumatic, PERRLA, normal appearance of eyes, ears, nose and mouth. moist mucus membranes.  NECK: supple, no masses or lymphadenopathy  LUNGS: clear to auscultation bilaterally, no  rales, rhonchi or wheeze  CV: HRRR, no peripheral edema or cyanosis, normal pedal pulses  ABDOMEN: bowel sounds normal, soft, non tender to palpation, no masses, no rebound or guarding  GU: declined  RECTAL: declined  SKIN: no rash or abnormal lesions  MS: normal gait, moves all extremities normally  NEURO: CN II-XII grossly intact, normal muscle strength and sensation to light touch on extremities  PSYCH: normal affect, pleasant and cooperative  ASSESSMENT AND PLAN:  Discussed the following assessment and plan:  Visit for preventive health examination  Essential hypertension - Plan: Basic metabolic panel  Hyperlipemia - Plan: Lipid Panel  Insomnia  -Discussed and advised all [place] preventive services health task force level A and B recommendations for age, sex and risks.  -Advised at least 150 minutes of exercise per week and a healthy diet low in saturated fats and sweets and consisting of fresh fruits and vegetables, lean meats such as fish and white chicken and whole grains.  -FASTING labs, studies and vaccines per orders this encounter   Patient advised to return to clinic immediately if symptoms worsen or persist or new concerns.  Patient Instructions  BEFORE YOU LEAVE: -labs -schedule follow up in 6 months  -We have ordered labs or studies at this visit. It can take up to 1-2 weeks for results and processing. We will contact you with instructions IF your results are abnormal. Normal results will be released to your [facility]. If you have not heard from [place] or can not find your results in [facility] in 2 weeks please contact our office.  -PLEASE SIGN UP FOR MYCHART TODAY   We recommend the following healthy lifestyle measures: - eat a healthy diet consisting of lots of vegetables, fruits, beans, nuts, seeds, healthy meats such as white chicken and fish and whole grains.  - avoid fried foods, fast food, processed foods, sodas, red meet and other fattening foods.  - get  a least 150 minutes of aerobic exercise per week.      No Follow-up on file.   [name] R.

## 2015-02-11 ENCOUNTER — Encounter: Payer: Self-pay | Admitting: Family Medicine

## 2015-02-12 ENCOUNTER — Other Ambulatory Visit: Payer: Self-pay | Admitting: *Deleted

## 2015-02-12 DIAGNOSIS — I10 Essential (primary) hypertension: Secondary | ICD-10-CM

## 2015-02-12 MED ORDER — AMLODIPINE-ATORVASTATIN 5-20 MG PO TABS: ORAL_TABLET | ORAL | Status: DC

## 2015-02-12 MED ORDER — NEBIVOLOL HCL 5 MG PO TABS: 2.5000 mg | ORAL_TABLET | Freq: Every day | ORAL | Status: DC

## 2015-02-12 NOTE — Telephone Encounter (Signed)
Rx done and the pt was informed via Mychart. 

## 2015-02-20 ENCOUNTER — Encounter: Payer: Self-pay | Admitting: Family Medicine

## 2015-02-28 ENCOUNTER — Ambulatory Visit (INDEPENDENT_AMBULATORY_CARE_PROVIDER_SITE_OTHER): Payer: BLUE CROSS/BLUE SHIELD | Admitting: Family Medicine

## 2015-02-28 ENCOUNTER — Encounter: Payer: Self-pay | Admitting: Family Medicine

## 2015-02-28 VITALS — BP 140/90 | HR 93 | Temp 98.2°F | Ht 68.75 in | Wt 174.3 lb

## 2015-02-28 DIAGNOSIS — G4700 Insomnia, unspecified: Secondary | ICD-10-CM | POA: Diagnosis not present

## 2015-02-28 DIAGNOSIS — I10 Essential (primary) hypertension: Secondary | ICD-10-CM

## 2015-02-28 DIAGNOSIS — E785 Hyperlipidemia, unspecified: Secondary | ICD-10-CM

## 2015-02-28 MED ORDER — AMLODIPINE BESYLATE 5 MG PO TABS: 5.0000 mg | ORAL_TABLET | Freq: Every day | ORAL | Status: DC

## 2015-02-28 MED ORDER — ROSUVASTATIN CALCIUM 20 MG PO TABS: 20.0000 mg | ORAL_TABLET | Freq: Every day | ORAL | Status: DC

## 2015-02-28 NOTE — Progress Notes (Signed)
HPI:  HTN: -BP is in the 120s/70-80s on average at home -current meds: amlodipine (5mg  in caduet) , bystolic 5 mg - reports he was having trouble affording medications so he stopped the bystolic and is only taking half of the caduet -denies: CP, SOB, palpitations, swelling  HLD: -meds: atovastatin in caduet -stable -denies: cramps, cog decline  Insomnia: -occ, rare with travel, uses sonata rarely -understands risks, I advised when he established care against use and trial of sleep hygiene and safer otions, but he prefers despite risks to use on occasion - advised if escalating use to see psych  ROS: See pertinent positives and negatives per HPI.  Past Medical History  Diagnosis Date  . Hyperlipidemia   . Hypertension   . Colon polyps   . Diverticulitis     Past Surgical History  Procedure Laterality Date  . Appendectomy    . Colonoscopy      Family History  Problem Relation Age of Onset  . Heart disease Father   . Colon cancer Neg Hx   . Pancreatic cancer Neg Hx   . Rectal cancer Neg Hx   . Stomach cancer Neg Hx     History   Social History  . Marital Status: Married    Spouse Name: N/A  . Number of Children: N/A  . Years of Education: N/A   Occupational History  . Retired    Social History Main Topics  . Smoking status: Never Smoker   . Smokeless tobacco: Never Used  . Alcohol Use: 4.2 oz/week    7 Glasses of wine per week  . Drug Use: No  . Sexual Activity: Yes   Other Topics Concern  . 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  aspirin 81 MG EC tablet, Take 81 mg by mouth daily.  , Disp: , Rfl:  .  Flaxseed, Linseed, (FLAXSEED OIL PO), Take by mouth as  directed., Disp: , Rfl:  .  Multiple Vitamin (MULTIVITAMIN) tablet, Take 1 tablet by mouth daily.  , Disp: , Rfl:  .  triamcinolone (NASACORT AQ) 55 MCG/ACT nasal inhaler, 2 sprays by Nasal route daily as needed.  , Disp: , Rfl:  .  amLODipine (NORVASC) 5 MG tablet, Take 1 tablet (5 mg total) by mouth daily., Disp: 90 tablet, Rfl: 3 .  rosuvastatin (CRESTOR) 20 MG tablet, Take 1 tablet (20 mg total) by mouth daily., Disp: 90 tablet, Rfl: 3  EXAM:  Filed Vitals:   02/28/15 0924  BP: 140/90  Pulse: 93  Temp: 98.2 F (36.8 C)    Body mass index is 25.93 kg/(m^2).  GENERAL: vitals reviewed and listed above, alert, oriented, appears well hydrated and in no acute distress  HEENT: atraumatic, conjunttiva clear, no obvious abnormalities on inspection of external nose and ears  NECK: no obvious masses on inspection  LUNGS: clear to auscultation bilaterally, no wheezes, rales or rhonchi, good air movement  CV: HRRR, no peripheral edema  MS: moves all extremities without noticeable abnormality  PSYCH: pleasant and cooperative, no obvious depression or anxiety  ASSESSMENT AND PLAN:  Discussed  the following assessment and plan:  Hyperlipemia - Plan: rosuvastatin (CRESTOR) 20 MG tablet -opted to stop the caduet and separate medications  Essential hypertension - Plan: amLODipine (NORVASC) 5 MG tablet -go back to full dose of norvasc -will consider adding and acei or diuretic if needed  Insomnia -stable  -Patient advised to return or notify a doctor immediately if symptoms worsen or persist or new concerns arise.  Patient Instructions  BEFORE you leave: -follow up in 2-3 months  We recommend the following healthy lifestyle measures: - eat a healthy diet consisting of lots of vegetables, fruits, beans, nuts, seeds, healthy meats such as white chicken and fish and whole grains.  - avoid fried foods, fast food, processed foods, sodas, red meet and other fattening foods.  - get a  least 150 minutes of aerobic exercise per week.    STOP the caduet  Start: -crestor and norvasc (amlodipine) once daily - these were sent to the mail in pharmacy     Clinton Newton, [place]

## 2015-02-28 NOTE — Progress Notes (Signed)
Pre visit review using our clinic review tool, if applicable. No additional management support is needed unless otherwise documented below in the visit note. 

## 2015-02-28 NOTE — Patient Instructions (Addendum)
BEFORE you leave: -follow up in 2-3 months  We recommend the following healthy lifestyle measures: - eat a healthy diet consisting of lots of vegetables, fruits, beans, nuts, seeds, healthy meats such as white chicken and fish and whole grains.  - avoid fried foods, fast food, processed foods, sodas, red meet and other fattening foods.  - get a least 150 minutes of aerobic exercise per week.    STOP the caduet  Start: -crestor and norvasc (amlodipine) once daily - these were sent to the mail in pharmacy

## 2015-03-07 ENCOUNTER — Encounter: Payer: Self-pay | Admitting: Family Medicine

## 2015-03-07 MED ORDER — NEBIVOLOL HCL 5 MG PO TABS: 2.5000 mg | ORAL_TABLET | Freq: Every day | ORAL | Status: DC

## 2015-03-12 ENCOUNTER — Encounter: Payer: Self-pay | Admitting: Family Medicine

## 2015-03-14 ENCOUNTER — Telehealth: Payer: Self-pay | Admitting: Family Medicine

## 2015-03-14 NOTE — Telephone Encounter (Signed)
Patient informed of the message below and states he will call [employer] and call us back.

## 2015-03-14 NOTE — Telephone Encounter (Signed)
[employer] has denied the PA for Mr Clinton Newton. He will need to contact [facility] and see what medications are Tier 1 for his specific policy.

## 2015-04-23 ENCOUNTER — Encounter: Payer: Self-pay | Admitting: Family Medicine

## 2015-04-30 ENCOUNTER — Encounter: Payer: Self-pay | Admitting: Family Medicine

## 2015-04-30 ENCOUNTER — Ambulatory Visit (INDEPENDENT_AMBULATORY_CARE_PROVIDER_SITE_OTHER): Payer: BLUE CROSS/BLUE SHIELD | Admitting: Family Medicine

## 2015-04-30 VITALS — BP 122/84 | HR 66 | Temp 98.3°F | Ht 68.75 in | Wt 173.1 lb

## 2015-04-30 DIAGNOSIS — L601 Onycholysis: Secondary | ICD-10-CM

## 2015-04-30 DIAGNOSIS — R000 Tachycardia, unspecified: Secondary | ICD-10-CM

## 2015-04-30 DIAGNOSIS — E785 Hyperlipidemia, unspecified: Secondary | ICD-10-CM | POA: Diagnosis not present

## 2015-04-30 DIAGNOSIS — I10 Essential (primary) hypertension: Secondary | ICD-10-CM

## 2015-04-30 DIAGNOSIS — G8929 Other chronic pain: Secondary | ICD-10-CM

## 2015-04-30 DIAGNOSIS — M549 Dorsalgia, unspecified: Secondary | ICD-10-CM

## 2015-04-30 DIAGNOSIS — G4700 Insomnia, unspecified: Secondary | ICD-10-CM

## 2015-04-30 MED ORDER — ATENOLOL 25 MG PO TABS: 25.0000 mg | ORAL_TABLET | Freq: Every day | ORAL | Status: DC

## 2015-04-30 MED ORDER — NAPROXEN 500 MG PO TABS: 500.0000 mg | ORAL_TABLET | Freq: Two times a day (BID) | ORAL | Status: DC

## 2015-04-30 NOTE — Patient Instructions (Signed)
BEFORE FOR LEAVE: -follow up in 3 months, come fasting -xray sheet -low back exercises  We recommend the following healthy lifestyle measures: - eat a healthy diet consisting of lots of vegetables, fruits, beans, nuts, seeds, healthy meats such as white chicken and fish and whole grains.  - avoid fried foods, fast food, processed foods, sodas, red meet and other fattening foods.  - get a least 150 minutes of aerobic exercise per week.

## 2015-04-30 NOTE — Progress Notes (Signed)
Pre visit review using our clinic review tool, if applicable. No additional management support is needed unless otherwise documented below in the visit note. 

## 2015-04-30 NOTE — Progress Notes (Signed)
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  Past Medical History  Diagnosis Date  . Hyperlipidemia   . Hypertension   . Colon polyps   . Diverticulitis     Past Surgical History  Procedure Laterality Date  . Appendectomy    . Colonoscopy      Family History  Problem Relation Age of Onset  . Heart disease Father   . Colon cancer Neg Hx   . Pancreatic cancer Neg Hx   . Rectal cancer Neg Hx   . Stomach cancer Neg Hx     History   Social History  . Marital Status: Married    Spouse Name: N/A  . Number of Children: N/A  . Years of Education: N/A   Occupational History  . Retired    Social History Main Topics  . Smoking status: Never Smoker   . Smokeless tobacco: Never Used  . Alcohol Use: 4.2 oz/week    7 Glasses of wine per week  . Drug Use: No  . Sexual Activity: Yes   Other Topics Concern  . 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  amLODipine (NORVASC) 5 MG tablet, Take 1 tablet (5 mg total) by mouth daily., Disp: 90 tablet, Rfl: 3 .  aspirin 81 MG EC tablet, Take 81 mg by mouth daily.  , Disp: , Rfl:  .  cholecalciferol (VITAMIN D) 1000 UNITS tablet, Take 1,000 Units by mouth daily., Disp: , Rfl:  .  Flaxseed, Linseed, (FLAXSEED OIL PO), Take by mouth as directed., Disp: , Rfl:  .  MAGNESIUM PO, Take by mouth daily., Disp: , Rfl:  .  Multiple Vitamin (MULTIVITAMIN) tablet, Take 1 tablet by mouth daily.  , Disp: , Rfl:  .  Omega-3 Fatty Acids (FISH OIL PO), Take by mouth daily., Disp: , Rfl:  .  Probiotic Product (PROBIOTIC PO), Take by mouth daily., Disp: , Rfl:  .  rosuvastatin (CRESTOR) 20 MG tablet, Take 1 tablet (20 mg total) by mouth daily., Disp: 90 tablet, Rfl: 3 .  triamcinolone (NASACORT AQ) 55 MCG/ACT nasal inhaler, 2 sprays by Nasal route daily as needed.  , Disp: , Rfl:  .  atenolol (  TENORMIN) 25 MG tablet, Take 1 tablet (25 mg total) by mouth daily., Disp: 90 tablet, Rfl: 3 .  naproxen (NAPROSYN) 500 MG tablet, Take 1 tablet (500 mg total) by mouth 2 (two) times daily with a meal., Disp: 30 tablet, Rfl: 0  EXAM:  Filed Vitals:   04/30/15 1003  BP: 122/84  Pulse: 66  Temp: 98.3 F (36.8 C)    Body mass index is 25.76 kg/(m^2).  GENERAL: vitals reviewed and listed above, alert, oriented, appears well hydrated and in no acute distress  HEENT: atraumatic, conjunttiva clear, no obvious abnormalities on inspection of external nose and ears  NECK: no obvious masses on inspection  LUNGS: clear to auscultation bilaterally, no wheezes, rales or rhonchi, good air movement  CV: HRRR, no peripheral edema  SKIN: mild whitening of distal nail bilat great toes without thickening or friability  MS: moves all extremities without  noticeable abnormality,  Normal Gait Normal inspection of back, no obvious scoliosis or leg length descrepancy No bony TTP Soft tissue TTP at: R lower lumbar paraspinal muscles -/+ tests: neg trendelenburg,-facet loading, -SLRT, -CLRT, -FABER, -FADIR Normal muscle strength, sensation to light touch and DTRs in LEs bilaterally   PSYCH: pleasant and cooperative, no obvious depression or anxiety  ASSESSMENT AND PLAN:  Discussed the following assessment and plan:  Essential hypertension - Plan: atenolol (TENORMIN) 25 MG tablet -continue norvasc, atenolol 25 mg daily for HTN and tachycardia  Hyperlipemia -cont crestor, labs next visit  Insomnia -stable  Chronic back pain - Plan: naproxen (NAPROSYN) 500 MG tablet, DG Lumbar Spine Complete -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  Patient Instructions  BEFORE FOR LEAVE: -follow up in 3 months, come fasting -xray sheet -low back exercises  We recommend the following healthy lifestyle measures: - eat a healthy diet consisting of lots of vegetables, fruits, beans, nuts, seeds, healthy meats such as white chicken and fish and whole grains.  - avoid fried foods, fast food, processed foods, sodas, red meet and other fattening foods.  - get a least 150 minutes of aerobic exercise per week.        Clinton Benton R.

## 2015-05-06 ENCOUNTER — Ambulatory Visit (INDEPENDENT_AMBULATORY_CARE_PROVIDER_SITE_OTHER)
Admission: RE | Admit: 2015-05-06 | Discharge: 2015-05-06 | Disposition: A | Discharge status: Home / Self Care | Payer: BLUE CROSS/BLUE SHIELD | Source: Ambulatory Visit | Attending: Family Medicine | Admitting: Family Medicine

## 2015-05-06 DIAGNOSIS — M549 Dorsalgia, unspecified: Secondary | ICD-10-CM

## 2015-05-06 DIAGNOSIS — G8929 Other chronic pain: Secondary | ICD-10-CM

## 2015-05-22 ENCOUNTER — Ambulatory Visit: Payer: BLUE CROSS/BLUE SHIELD | Admitting: Family Medicine

## 2015-05-27 ENCOUNTER — Encounter: Payer: Self-pay | Admitting: Family Medicine

## 2015-05-28 NOTE — Telephone Encounter (Signed)
I called the pt and advised him of the message below and he needs to be seen per Dr Maudie Mercury and an appt was scheduled for tomorrow at 11am.

## 2015-05-29 ENCOUNTER — Encounter: Payer: Self-pay | Admitting: Family Medicine

## 2015-05-29 ENCOUNTER — Ambulatory Visit (INDEPENDENT_AMBULATORY_CARE_PROVIDER_SITE_OTHER): Payer: BLUE CROSS/BLUE SHIELD | Admitting: Family Medicine

## 2015-05-29 DIAGNOSIS — I10 Essential (primary) hypertension: Secondary | ICD-10-CM | POA: Diagnosis not present

## 2015-05-29 DIAGNOSIS — R079 Chest pain, unspecified: Secondary | ICD-10-CM | POA: Diagnosis not present

## 2015-05-29 MED ORDER — BISOPROLOL FUMARATE 5 MG PO TABS: 5.0000 mg | ORAL_TABLET | Freq: Every day | ORAL | Status: DC

## 2015-05-29 NOTE — Progress Notes (Signed)
Pre visit review using our clinic review tool, if applicable. No additional management support is needed unless otherwise documented below in the visit note. 

## 2015-05-29 NOTE — Progress Notes (Signed)
HPI:  Acute visit for:  ? Medication side effect: -pt recently changed from bystolic to atenolol due to his request for cost reasons and wants to stop this -he has been on a BB for > ten years started by his prior PCP for tachycardia he thinks -when misses atenolol  HR goes up (he monitors and it went up to 125 with exercise)  -he had L arm pain once after exertion since change in medcation - that did not go away for about 1 hour -of note he reports has had some intermittent chest pressure for a long time, he did not think to tell be before, that can occur at rest or with activity - not really pain,lasts for hours to all day -denies: SOB, DOE, swelling, palpations, cough, fevers, chills -hx of acid reflux, but reports hasn't had much trouble since cutting out chocolate  ROS: See pertinent positives and negatives per HPI.  Past Medical History  Diagnosis Date  . Hyperlipidemia   . Hypertension   . Colon polyps   . Diverticulitis     Past Surgical History  Procedure Laterality Date  . Appendectomy    . Colonoscopy      Family History  Problem Relation Age of Onset  . Heart disease Father   . Colon cancer Neg Hx   . Pancreatic cancer Neg Hx   . Rectal cancer Neg Hx   . Stomach cancer Neg Hx     History   Social History  . Marital Status: Married    Spouse Name: N/A  . Number of Children: N/A  . Years of Education: N/A   Occupational History  . Retired    Social History Main Topics  . Smoking status: Never Smoker   . Smokeless tobacco: Never Used  . Alcohol Use: 4.2 oz/week    7 Glasses of wine per week  . Drug Use: No  . Sexual Activity: Yes   Other Topics Concern  . 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  amLODipine (NORVASC) 5 MG tablet, Take 1 tablet (5 mg total) by mouth daily., Disp: 90 tablet, Rfl: 3 .  aspirin 81 MG EC tablet, Take 81 mg by mouth daily.  , Disp: , Rfl:  .  cholecalciferol (VITAMIN D) 1000 UNITS tablet, Take 1,000 Units by mouth daily., Disp: , Rfl:  .  Flaxseed, Linseed, (FLAXSEED OIL PO), Take by mouth as directed., Disp: , Rfl:  .  MAGNESIUM PO, Take by mouth daily., Disp: , Rfl:  .  Multiple Vitamin (MULTIVITAMIN) tablet, Take 1 tablet by mouth daily.  , Disp: , Rfl:  .  naproxen (NAPROSYN) 500 MG tablet, Take 1 tablet (500 mg total) by mouth 2 (two) times daily with a meal., Disp: 30 tablet, Rfl: 0 .  Omega-3 Fatty Acids (FISH OIL PO), Take by mouth daily., Disp: , Rfl:  .  Probiotic Product (PROBIOTIC PO), Take by mouth daily., Disp: , Rfl:  .  rosuvastatin (CRESTOR) 20 MG tablet, Take 1 tablet (20 mg total) by mouth daily., Disp: 90 tablet, Rfl: 3 .  triamcinolone (  NASACORT AQ) 55 MCG/ACT nasal inhaler, 2 sprays by Nasal route daily as needed.  , Disp: , Rfl:  .  bisoprolol (ZEBETA) 5 MG tablet, Take 1 tablet (5 mg total) by mouth daily., Disp: 90 tablet, Rfl: 3  EXAM:  Filed Vitals:   05/29/15 1100  BP: 116/80  Pulse: 57  Temp: 98.3 F (36.8 C)    Body mass index is 25.98 kg/(m^2).  GENERAL: vitals reviewed and listed above, alert, oriented, appears well hydrated and in no acute distress  HEENT: atraumatic, conjunttiva clear, no obvious abnormalities on inspection of external nose and ears  NECK: no obvious masses on inspection  LUNGS: clear to auscultation bilaterally, no wheezes, rales or rhonchi, good air movement  CV: HRRR, no peripheral edema  MS: moves all extremities without noticeable abnormality  PSYCH: pleasant and cooperative, no obvious depression or anxiety  ASSESSMENT AND PLAN:  Discussed the following assessment and plan:  Chest pain, unspecified chest pain type - Plan: Exercise  Tolerance Test, EKG 12-Lead -EKG read and interpreted by myself, unchanged from in the past, bradycardia as in the past -for his chest pain, other then the single event with activity, seems atypical. Discussed potential causes, workup and treatment of CP with GERD or musculoskeletal more likely, but did advise further testing for for CAD as well and discussed option. Decided to do exercise stress test, cont his asa, return and emergency precautions.  Essential hypertension/?tachycardia -discussed options, he wants to stop atenolol, I wonder if he even needs a BB - was on a very low dose and brady on all EKGs in the past with prior PCP -we discussed this, he is very sensitive to he heart rate going up even in normal ranges with exercise -opted to try a 1/2 tab of the lowest dose of bisoprolol which is on his preferred list, and then maybe a trial off all together if tolerates, hold day of stress test -close follow up in 1 month, sooner if needed -cont other BP meds   -Patient advised to return or notify a doctor immediately if symptoms worsen or persist or new concerns arise.  Patient Instructions  -stop low dose atenolol  -start new medication half tablet then will consider wean off if you are comfortable with this - sent to pharmacy  --We placed a referral for you as discussed for the stress test. It usually takes about 1-2 weeks to process and schedule this referral. If you have not heard from [place] regarding this appointment in 2 weeks please contact our office.  -if chest pain with activity, difficulty breathing or any of the symptoms we discuss or concern for heart event please seek emergency care immediately  Follow up in 1 month     Tan Clopper, [place]

## 2015-05-29 NOTE — Patient Instructions (Addendum)
-  stop low dose atenolol  -start new medication half tablet then will consider wean off if you are comfortable with this - sent to pharmacy  --We placed a referral for you as discussed for the stress test. It usually takes about 1-2 weeks to process and schedule this referral. If you have not heard from [place] regarding this appointment in 2 weeks please contact our office.  -if chest pain with activity, difficulty breathing or any of the symptoms we discuss or concern for heart event please seek emergency care immediately  Follow up in 1 month

## 2015-05-31 ENCOUNTER — Telehealth (HOSPITAL_COMMUNITY): Payer: Self-pay | Admitting: *Deleted

## 2015-06-23 ENCOUNTER — Encounter: Payer: Self-pay | Admitting: Family Medicine

## 2015-06-24 ENCOUNTER — Encounter: Payer: Self-pay | Admitting: Family Medicine

## 2015-06-25 ENCOUNTER — Other Ambulatory Visit: Payer: Self-pay | Admitting: *Deleted

## 2015-06-25 DIAGNOSIS — I10 Essential (primary) hypertension: Secondary | ICD-10-CM

## 2015-06-25 MED ORDER — AMLODIPINE BESYLATE 5 MG PO TABS: 5.0000 mg | ORAL_TABLET | Freq: Every day | ORAL | Status: DC

## 2015-06-25 NOTE — Telephone Encounter (Signed)
Rx done and the pt was informed via Mychart message. 

## 2015-06-28 ENCOUNTER — Encounter: Payer: Self-pay | Admitting: Family Medicine

## 2015-06-28 ENCOUNTER — Ambulatory Visit (INDEPENDENT_AMBULATORY_CARE_PROVIDER_SITE_OTHER): Payer: BLUE CROSS/BLUE SHIELD | Admitting: Family Medicine

## 2015-06-28 VITALS — BP 132/82 | HR 73 | Temp 98.1°F | Ht 68.75 in | Wt 176.7 lb

## 2015-06-28 DIAGNOSIS — I10 Essential (primary) hypertension: Secondary | ICD-10-CM

## 2015-06-28 DIAGNOSIS — Z23 Encounter for immunization: Secondary | ICD-10-CM | POA: Diagnosis not present

## 2015-06-28 DIAGNOSIS — R000 Tachycardia, unspecified: Secondary | ICD-10-CM

## 2015-06-28 NOTE — Progress Notes (Signed)
HPI:  Follow up:  HTN/Hx ? Tachycardia: -meds: norvasc 5mg , bisoprolol 2.5mg  - he is very sensitive to his HR going up to normal ranges with exercises -we ordered a stress test 05/29/2015, he has this on the schedule -denies: CP, SOb, DOE any symptoms since restarting bisoprolol 2.5 mg daily   ROS: See pertinent positives and negatives per HPI.  Past Medical History  Diagnosis Date  . Hyperlipidemia   . Hypertension   . Colon polyps   . Diverticulitis     Past Surgical History  Procedure Laterality Date  . Appendectomy    . Colonoscopy      Family History  Problem Relation Age of Onset  . Heart disease Father   . Colon cancer Neg Hx   . Pancreatic cancer Neg Hx   . Rectal cancer Neg Hx   . Stomach cancer Neg Hx     Social History   Social History  . Marital Status: Married    Spouse Name: N/A  . Number of Children: N/A  . Years of Education: N/A   Occupational History  . Retired    Social History Main Topics  . Smoking status: Never Smoker   . Smokeless tobacco: Never Used  . Alcohol Use: 4.2 oz/week    7 Glasses of wine per week  . Drug Use: No  . Sexual Activity: Yes   Other Topics Concern  . 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  amLODipine (NORVASC) 5 MG tablet, Take 1 tablet (5 mg total) by mouth daily., Disp: 90 tablet, Rfl: 0 .  aspirin 81 MG EC tablet, Take 81 mg by mouth daily.  , Disp: , Rfl:  .  bisoprolol (ZEBETA) 5 MG tablet, Take 1 tablet (5 mg total) by mouth daily., Disp: 90 tablet, Rfl: 3 .  cholecalciferol (VITAMIN D) 1000 UNITS tablet, Take 1,000 Units by mouth daily., Disp: , Rfl:  .  Flaxseed, Linseed, (FLAXSEED OIL PO), Take by mouth as  directed., Disp: , Rfl:  .  MAGNESIUM PO, Take by mouth daily., Disp: , Rfl:  .  Multiple Vitamin (MULTIVITAMIN) tablet, Take 1 tablet by mouth daily.  , Disp: , Rfl:  .  naproxen (NAPROSYN) 500 MG tablet, Take 1 tablet (500 mg total) by mouth 2 (two) times daily with a meal., Disp: 30 tablet, Rfl: 0 .  Omega-3 Fatty Acids (FISH OIL PO), Take by mouth daily., Disp: , Rfl:  .  Probiotic Product (PROBIOTIC PO), Take by mouth daily., Disp: , Rfl:  .  rosuvastatin (CRESTOR) 20 MG tablet, Take 1 tablet (20 mg total) by mouth daily., Disp: 90 tablet, Rfl: 3 .  triamcinolone (NASACORT AQ) 55 MCG/ACT nasal inhaler, 2 sprays by Nasal route daily as needed.  , Disp: , Rfl:   EXAM:  Filed Vitals:   06/28/15 1253  BP: 132/82  Pulse: 73  Temp: 98.1 F (36.7 C)    Body mass index is 26.29 kg/(m^2).  GENERAL: vitals reviewed and listed above, alert, oriented, appears well hydrated and in no acute distress  HEENT: atraumatic, conjunttiva clear, no obvious  abnormalities on inspection of external nose and ears  NECK: no obvious masses on inspection  LUNGS: clear to auscultation bilaterally, no wheezes, rales or rhonchi, good air movement  CV: HRRR, no peripheral edema  MS: moves all extremities without noticeable abnormality  PSYCH: pleasant and cooperative, no obvious depression or anxiety  ASSESSMENT AND PLAN:  Discussed the following assessment and plan:  Essential hypertension  Tachycardia  -cont current meds -stop BB before stress test, then resume after testing -CPE in January -flu vaccine today -Patient advised to return or notify a doctor immediately if symptoms worsen or persist or new concerns arise.  Patient Instructions  BEFORE YOU LEAVE: -flu shot -schedule physical in January and come fasting  Get stress test as planned     [name] R.

## 2015-06-28 NOTE — Patient Instructions (Signed)
BEFORE YOU LEAVE: -flu shot -schedule physical in January and come fasting  Get stress test as planned

## 2015-06-28 NOTE — Progress Notes (Signed)
Pre visit review using our clinic review tool, if applicable. No additional management support is needed unless otherwise documented below in the visit note. 

## 2015-07-05 ENCOUNTER — Ambulatory Visit: Payer: BLUE CROSS/BLUE SHIELD | Admitting: Nurse Practitioner

## 2015-07-17 ENCOUNTER — Ambulatory Visit (INDEPENDENT_AMBULATORY_CARE_PROVIDER_SITE_OTHER): Payer: BLUE CROSS/BLUE SHIELD

## 2015-07-17 ENCOUNTER — Encounter: Payer: BLUE CROSS/BLUE SHIELD | Admitting: Nurse Practitioner

## 2015-07-17 DIAGNOSIS — R079 Chest pain, unspecified: Secondary | ICD-10-CM

## 2015-07-17 LAB — EXERCISE TOLERANCE TEST
CHL CUP MPHR: 158 {beats}/min
CHL CUP RESTING HR STRESS: 77 {beats}/min
CHL RATE OF PERCEIVED EXERTION: 17
CSEPHR: 92 %
Estimated workload: 11.7 METS
Exercise duration (min): 10 min
Peak HR: 146 {beats}/min

## 2015-07-31 ENCOUNTER — Ambulatory Visit (INDEPENDENT_AMBULATORY_CARE_PROVIDER_SITE_OTHER): Payer: BLUE CROSS/BLUE SHIELD | Admitting: Family Medicine

## 2015-07-31 ENCOUNTER — Encounter: Payer: Self-pay | Admitting: Family Medicine

## 2015-07-31 VITALS — BP 126/80 | HR 62 | Temp 97.7°F | Ht 68.75 in | Wt 173.9 lb

## 2015-07-31 DIAGNOSIS — R739 Hyperglycemia, unspecified: Secondary | ICD-10-CM | POA: Diagnosis not present

## 2015-07-31 DIAGNOSIS — Z1159 Encounter for screening for other viral diseases: Secondary | ICD-10-CM

## 2015-07-31 DIAGNOSIS — I10 Essential (primary) hypertension: Secondary | ICD-10-CM

## 2015-07-31 DIAGNOSIS — R000 Tachycardia, unspecified: Secondary | ICD-10-CM | POA: Diagnosis not present

## 2015-07-31 DIAGNOSIS — E785 Hyperlipidemia, unspecified: Secondary | ICD-10-CM | POA: Diagnosis not present

## 2015-07-31 DIAGNOSIS — G4700 Insomnia, unspecified: Secondary | ICD-10-CM | POA: Diagnosis not present

## 2015-07-31 LAB — BASIC METABOLIC PANEL
BUN: 13 mg/dL (ref 6–23)
CHLORIDE: 104 meq/L (ref 96–112)
CO2: 31 mEq/L (ref 19–32)
Calcium: 9.8 mg/dL (ref 8.4–10.5)
Creatinine, Ser: 0.77 mg/dL (ref 0.40–1.50)
GFR: 108.52 mL/min (ref >60.00)
Glucose, Bld: 103 mg/dL — ABNORMAL HIGH (ref 70–99)
POTASSIUM: 4.5 meq/L (ref 3.5–5.1)
Sodium: 144 mEq/L (ref 135–145)

## 2015-07-31 LAB — HEMOGLOBIN A1C: Hgb A1c MFr Bld: 5.2 % (ref 4.6–6.5)

## 2015-07-31 LAB — CHOLESTEROL, TOTAL: CHOLESTEROL: 176 mg/dL (ref 0–200)

## 2015-07-31 LAB — HDL CHOLESTEROL: HDL: 68 mg/dL (ref >39.00)

## 2015-07-31 MED ORDER — ZALEPLON 5 MG PO CAPS: 5.0000 mg | ORAL_CAPSULE | Freq: Every evening | ORAL | Status: DC | PRN

## 2015-07-31 NOTE — Progress Notes (Signed)
HPI:  HTN/Hx ? 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  Past Medical History  Diagnosis Date  . Hyperlipidemia   . Hypertension   . Colon polyps   . Diverticulitis     Past Surgical History  Procedure Laterality Date  . Appendectomy    . Colonoscopy      Family History  Problem Relation Age of Onset  . Heart disease Father   . Colon cancer Neg Hx   . Pancreatic cancer Neg Hx   . Rectal cancer Neg Hx   . Stomach cancer Neg Hx     Social History   Social History  . Marital Status: Married    Spouse Name: N/A  . Number of Children: N/A  . Years of Education: N/A   Occupational History  . Retired    Social History Main Topics  . Smoking status: Never Smoker   . Smokeless tobacco: Never Used  . Alcohol Use: 4.2 oz/week    7 Glasses of wine per  week  . Drug Use: No  . Sexual Activity: Yes   Other Topics Concern  . 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  amLODipine (NORVASC) 5 MG tablet, Take 1 tablet (5 mg total) by mouth daily., Disp: 90 tablet, Rfl: 0 .  aspirin 81 MG EC tablet, Take 81 mg by mouth daily.  , Disp: , Rfl:  .  bisoprolol (ZEBETA) 5 MG tablet, Take 1 tablet (5 mg total) by mouth daily., Disp: 90 tablet, Rfl: 3 .  cholecalciferol (VITAMIN D) 1000 UNITS tablet, Take 1,000 Units by mouth daily., Disp: , Rfl:  .  Flaxseed, Linseed, (FLAXSEED OIL PO), Take by mouth as directed., Disp: , Rfl:  .  MAGNESIUM PO, Take by mouth daily., Disp: , Rfl:  .  Multiple Vitamin (MULTIVITAMIN) tablet, Take 1 tablet by mouth daily.  , Disp: , Rfl:  .  naproxen (NAPROSYN) 500 MG tablet, Take 1 tablet (500 mg total) by mouth  2 (two) times daily with a meal., Disp: 30 tablet, Rfl: 0 .  Omega-3 Fatty Acids (FISH OIL PO), Take by mouth daily., Disp: , Rfl:  .  Probiotic Product (PROBIOTIC PO), Take by mouth daily., Disp: , Rfl:  .  rosuvastatin (CRESTOR) 20 MG tablet, Take 1 tablet (20 mg total) by mouth daily., Disp: 90 tablet, Rfl: 3 .  triamcinolone (NASACORT AQ) 55 MCG/ACT nasal inhaler, 2 sprays by Nasal route daily as needed.  , Disp: , Rfl:  .  zaleplon (SONATA) 5 MG capsule, Take 1 capsule (5 mg total) by mouth at bedtime as needed for sleep., Disp: 5 capsule, Rfl: 0  EXAM:  Filed Vitals:   07/31/15 0943  BP: 126/80  Pulse: 62  Temp: 97.7 F (36.5 C)    Body mass index is 25.88 kg/(m^2).  GENERAL: vitals reviewed and listed above, alert, oriented, appears well hydrated and in no acute distress  HEENT: atraumatic, conjunttiva clear,  no obvious abnormalities on inspection of external nose and ears  NECK: no obvious masses on inspection  LUNGS: clear to auscultation bilaterally, no wheezes, rales or rhonchi, good air movement  CV: HRRR, no peripheral edema  MS: moves all extremities without noticeable abnormality  PSYCH: pleasant and cooperative, no obvious depression or anxiety  ASSESSMENT AND PLAN:  Discussed the following assessment and plan:  Hyperlipemia - Plan: Cholesterol, Total, HDL cholesterol, CANCELED: Lipid Panel  Essential hypertension - Plan: Basic metabolic panel  Insomnia  Tachycardia  Hyperglycemia - Plan: Hemoglobin A1c  Need for hepatitis C screening test - Plan: Hep C Antibody  Labs, lifestyle recs Discussed risk of sleep aid, he understands risks but prefers to use rarely for poor sleep related to travel Physical in 4-6 months -Patient advised to return or notify a doctor immediately if symptoms worsen or persist or new concerns arise.  Patient Instructions  BEFORE YOU LEAVE: -labs -schedule physical in 4-6 months  We recommend the following healthy lifestyle measures: - eat a healthy whole foods diet consisting of regular small meals composed of vegetables, fruits, beans, nuts, seeds, healthy meats such as white chicken and fish and whole grains.  - avoid sweets, white starchy foods, fried foods, fast food, processed foods, sodas, red meet and other fattening foods.  - get a least 150-300 minutes of aerobic exercise per week.       Clinton Benton R.

## 2015-07-31 NOTE — Patient Instructions (Addendum)
BEFORE YOU LEAVE: -labs -schedule physical in 4-6 months  We recommend the following healthy lifestyle measures: - eat a healthy whole foods diet consisting of regular small meals composed of vegetables, fruits, beans, nuts, seeds, healthy meats such as white chicken and fish and whole grains.  - avoid sweets, white starchy foods, fried foods, fast food, processed foods, sodas, red meet and other fattening foods.  - get a least 150-300 minutes of aerobic exercise per week.

## 2015-07-31 NOTE — Progress Notes (Signed)
Pre visit review using our clinic review tool, if applicable. No additional management support is needed unless otherwise documented below in the visit note. 

## 2015-08-01 LAB — HEPATITIS C ANTIBODY: HCV Ab: NEGATIVE

## 2015-09-02 ENCOUNTER — Other Ambulatory Visit: Payer: Self-pay | Admitting: Family Medicine

## 2015-09-02 DIAGNOSIS — I10 Essential (primary) hypertension: Secondary | ICD-10-CM

## 2015-09-02 MED ORDER — AMLODIPINE BESYLATE 5 MG PO TABS: 5.0000 mg | ORAL_TABLET | Freq: Every day | ORAL | Status: DC

## 2015-09-02 NOTE — Telephone Encounter (Signed)
Rx done and the pt was informed via Mychart message. 

## 2015-09-02 NOTE — Addendum Note (Signed)
Addended by: Agnes Lawrence on: 09/02/2015 03:02 PM   Modules accepted: Orders

## 2015-10-29 ENCOUNTER — Other Ambulatory Visit: Payer: Self-pay | Admitting: *Deleted

## 2015-10-29 ENCOUNTER — Encounter: Payer: Self-pay | Admitting: Family Medicine

## 2015-10-29 MED ORDER — BISOPROLOL FUMARATE 5 MG PO TABS: 5.0000 mg | ORAL_TABLET | Freq: Every day | ORAL | Status: DC

## 2015-10-29 NOTE — Telephone Encounter (Signed)
Rx done and the pt was informed via Mychart message. 

## 2015-10-31 ENCOUNTER — Encounter: Payer: Self-pay | Admitting: Family Medicine

## 2015-10-31 ENCOUNTER — Ambulatory Visit (INDEPENDENT_AMBULATORY_CARE_PROVIDER_SITE_OTHER): Payer: BLUE CROSS/BLUE SHIELD | Admitting: Family Medicine

## 2015-10-31 VITALS — BP 128/80 | HR 78 | Temp 98.3°F | Ht 68.75 in | Wt 183.0 lb

## 2015-10-31 DIAGNOSIS — M25522 Pain in left elbow: Secondary | ICD-10-CM | POA: Diagnosis not present

## 2015-10-31 NOTE — Progress Notes (Signed)
HPI:   Acute visit for   L elbow pain: -started 1 month ago -does lots of activities with arms at the gym - swimming, resistance water aerobics, lifts wts while on treadmill -has been noticing twinges of pain in lateral elbow with activities that extend the wrist -denies: injury, weakness, numbness, radiation, malaise -otc analgesics have not helped   ROS: See pertinent positives and negatives per HPI.  Past Medical History  Diagnosis Date  . Hyperlipidemia   . Hypertension   . Colon polyps   . Diverticulitis     Past Surgical History  Procedure Laterality Date  . Appendectomy    . Colonoscopy      Family History  Problem Relation Age of Onset  . Heart disease Father   . Colon cancer Neg Hx   . Pancreatic cancer Neg Hx   . Rectal cancer Neg Hx   . Stomach cancer Neg Hx     Social History   Social History  . Marital Status: Married    Spouse Name: N/A  . Number of Children: N/A  . Years of Education: N/A   Occupational History  . Retired    Social History Main Topics  . Smoking status: Never Smoker   . Smokeless tobacco: Never Used  . Alcohol Use: 4.2 oz/week    7 Glasses of wine per week  . Drug Use: No  . Sexual Activity: Yes   Other Topics Concern  . 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  amLODipine (NORVASC) 5 MG tablet, Take 1 tablet (5 mg total) by mouth daily., Disp: 90 tablet, Rfl: 0 .  amLODipine (NORVASC) 5 MG tablet, Take 1 tablet (5 mg total) by mouth daily., Disp: 90 tablet, Rfl: 1 .  aspirin 81 MG EC tablet, Take 81 mg by mouth daily.  , Disp: , Rfl:  .  bisoprolol (ZEBETA) 5 MG tablet, Take 1 tablet (5 mg total) by mouth daily., Disp: 90 tablet,  Rfl: 1 .  cholecalciferol (VITAMIN D) 1000 UNITS tablet, Take 1,000 Units by mouth daily., Disp: , Rfl:  .  Flaxseed, Linseed, (FLAXSEED OIL PO), Take by mouth as directed., Disp: , Rfl:  .  MAGNESIUM PO, Take by mouth daily., Disp: , Rfl:  .  Multiple Vitamin (MULTIVITAMIN) tablet, Take 1 tablet by mouth daily.  , Disp: , Rfl:  .  naproxen (NAPROSYN) 500 MG tablet, Take 1 tablet (500 mg total) by mouth 2 (two) times daily with a meal., Disp: 30 tablet, Rfl: 0 .  Omega-3 Fatty Acids (FISH OIL PO), Take by mouth daily., Disp: , Rfl:  .  Probiotic Product (PROBIOTIC PO), Take by mouth daily., Disp: , Rfl:  .  rosuvastatin (CRESTOR) 20 MG tablet, Take 1 tablet (20 mg total) by mouth daily., Disp: 90 tablet, Rfl: 3 .  triamcinolone (NASACORT AQ) 55 MCG/ACT nasal inhaler, 2 sprays by Nasal route daily as needed.  , Disp: , Rfl:  .  zaleplon (SONATA) 5 MG capsule, Take 1 capsule (5 mg total) by mouth at bedtime as needed for sleep., Disp: 5 capsule, Rfl:  0  EXAM:  Filed Vitals:   10/31/15 1421  BP: 128/80  Pulse: 78  Temp: 98.3 F (36.8 C)    Body mass index is 27.23 kg/(m^2).  GENERAL: vitals reviewed and listed above, alert, oriented, appears well hydrated and in no acute distress  HEENT: atraumatic, conjunttiva clear, no obvious abnormalities on inspection of external nose and ears  NECK: no obvious masses on inspection  MS/Neuro: moves all extremities without noticeable abnormality, TTP in ext tendons of forearm on L, pain with ext of wrists against resistance, normal strength, sensation to light touch in bilat ue, normal radial pulses  PSYCH: pleasant and cooperative, no obvious depression or anxiety  ASSESSMENT AND PLAN:  Discussed the following assessment and plan:  Elbow pain, left  --we discussed possible serious and likely etiologies, workup and treatment, treatment risks and return precautions - 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   Patient Instructions  BEFORE YOU LEAVE: -exercises for lateral epidondylitis -follow up in 1 month  Take a short break (2-4 weeks) from all of the arm exercises/activities - then ease back in after feeling better  Use ice 1-2 times daily if in pain  Do the exercises provided 3 days per week       [name] R.

## 2015-10-31 NOTE — Patient Instructions (Signed)
BEFORE YOU LEAVE: -exercises for lateral epidondylitis -follow up in 1 month  Take a short break (2-4 weeks) from all of the arm exercises/activities - then ease back in after feeling better  Use ice 1-2 times daily if in pain  Do the exercises provided 3 days per week

## 2015-11-27 ENCOUNTER — Ambulatory Visit (INDEPENDENT_AMBULATORY_CARE_PROVIDER_SITE_OTHER): Payer: BLUE CROSS/BLUE SHIELD | Admitting: Family Medicine

## 2015-11-27 ENCOUNTER — Encounter: Payer: Self-pay | Admitting: Family Medicine

## 2015-11-27 VITALS — BP 120/82 | HR 66 | Temp 98.7°F | Ht 69.0 in | Wt 182.1 lb

## 2015-11-27 DIAGNOSIS — Z0000 Encounter for general adult medical examination without abnormal findings: Secondary | ICD-10-CM

## 2015-11-27 DIAGNOSIS — E785 Hyperlipidemia, unspecified: Secondary | ICD-10-CM | POA: Diagnosis not present

## 2015-11-27 DIAGNOSIS — M7711 Lateral epicondylitis, right elbow: Secondary | ICD-10-CM | POA: Diagnosis not present

## 2015-11-27 DIAGNOSIS — I10 Essential (primary) hypertension: Secondary | ICD-10-CM | POA: Diagnosis not present

## 2015-11-27 LAB — BASIC METABOLIC PANEL
BUN: 16 mg/dL (ref 6–23)
CALCIUM: 10 mg/dL (ref 8.4–10.5)
CO2: 29 mEq/L (ref 19–32)
Chloride: 105 mEq/L (ref 96–112)
Creatinine, Ser: 0.82 mg/dL (ref 0.40–1.50)
GFR: 100.82 mL/min (ref >60.00)
GLUCOSE: 105 mg/dL — AB (ref 70–99)
Potassium: 4.6 mEq/L (ref 3.5–5.1)
Sodium: 145 mEq/L (ref 135–145)

## 2015-11-27 LAB — LIPID PANEL
CHOL/HDL RATIO: 3
Cholesterol: 186 mg/dL (ref 0–200)
HDL: 69.3 mg/dL (ref >39.00)
LDL Cholesterol: 84 mg/dL (ref 0–99)
NONHDL: 116.83
TRIGLYCERIDES: 162 mg/dL — AB (ref 0.0–149.0)
VLDL: 32.4 mg/dL (ref 0.0–40.0)

## 2015-11-27 NOTE — Progress Notes (Signed)
Pre visit review using our clinic review tool, if applicable. No additional management support is needed unless otherwise documented below in the visit note. 

## 2015-11-27 NOTE — Progress Notes (Signed)
HPI:  Here for CPE:  -Concerns and/or follow up today:   Lat epicondylitis: -improving a little with cessation of aggravating activities -no weakness, numbness, worsening  HTN/Hx ? 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 Jeffie Pollock at [facility] for hx elevated PSA  Sees Crista Luria for annual skin checks  -Diet: variety of foods, balance and well rounded  -Exercise: regular exercise  -Diabetes and Dyslipidemia Screening:fasting for labs today  -Vaccines: UTD  -sexual activity: yes, male partner, no new partners  -wants STI testing, Hep C screening (if born 71-1964): no  -FH colon or prstate ca: see FH Last colon cancer screening: sees gi for regular colonoscopyu   -Alcohol, Tobacco, drug use: see social history  Review of Systems - no fevers, unintentional weight loss, vision loss, hearing loss, chest pain, sob, hemoptysis, melena, hematochezia, hematuria, genital discharge, changing or concerning skin lesions, bleeding, bruising, loc, thoughts of self harm or SI  Past Medical  History  Diagnosis Date  . Hyperlipidemia   . Hypertension   . Colon polyps   . Diverticulitis     Past Surgical History  Procedure Laterality Date  . Appendectomy    . Colonoscopy      Family History  Problem Relation Age of Onset  . Heart disease Father   . Colon cancer Neg Hx   . Pancreatic cancer Neg Hx   . Rectal cancer Neg Hx   . Stomach cancer Neg Hx     Social History   Social History  . Marital Status: Married    Spouse Name: N/A  . Number of Children: N/A  . Years of Education: N/A   Occupational History  . Retired    Social History Main Topics  . Smoking status: Never Smoker   . Smokeless tobacco: Never Used  . Alcohol Use: 4.2 oz/week    7 Glasses of wine per week  . Drug Use: No  . Sexual Activity: Yes   Other Topics Concern  . 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  amLODipine (NORVASC) 5 MG tablet, Take 1 tablet (5 mg total) by mouth daily., Disp: 90 tablet, Rfl: 1 .  aspirin 81 MG EC  tablet, Take 81 mg by mouth daily.  , Disp: , Rfl:  .  bisoprolol (ZEBETA) 5 MG tablet, Take 1 tablet (5 mg total) by mouth daily., Disp: 90 tablet, Rfl: 1 .  cholecalciferol (VITAMIN D) 1000 UNITS tablet, Take 1,000 Units by mouth daily., Disp: , Rfl:  .  Flaxseed, Linseed, (FLAXSEED OIL PO), Take by mouth as directed., Disp: , Rfl:  .  MAGNESIUM PO, Take by mouth daily., Disp: , Rfl:  .  Multiple Vitamin (MULTIVITAMIN) tablet, Take 1 tablet by mouth daily.  , Disp: , Rfl:  .  naproxen (NAPROSYN) 500 MG tablet, Take 1 tablet (500 mg total) by mouth 2 (two) times daily with a meal., Disp: 30 tablet, Rfl: 0 .  Omega-3 Fatty Acids (FISH OIL PO), Take by mouth daily., Disp: , Rfl:   .  Probiotic Product (PROBIOTIC PO), Take by mouth daily., Disp: , Rfl:  .  rosuvastatin (CRESTOR) 20 MG tablet, Take 1 tablet (20 mg total) by mouth daily., Disp: 90 tablet, Rfl: 3 .  triamcinolone (NASACORT AQ) 55 MCG/ACT nasal inhaler, 2 sprays by Nasal route daily as needed.  , Disp: , Rfl:  .  zaleplon (SONATA) 5 MG capsule, Take 1 capsule (5 mg total) by mouth at bedtime as needed for sleep., Disp: 5 capsule, Rfl: 0  EXAM:  Filed Vitals:   11/27/15 0833  BP: 120/82  Pulse: 66  Temp: 98.7 F (37.1 C)  TempSrc: Oral  Height: 5\' 9"  (1.753 m)  Weight: 182 lb 1.6 oz (82.6 kg)    Estimated body mass index is 26.88 kg/(m^2) as calculated from the following:   Height as of this encounter: 5\' 9"  (1.753 m).   Weight as of this encounter: 182 lb 1.6 oz (82.6 kg).  GENERAL: vitals reviewed and listed below, alert, oriented, appears well hydrated and in no acute distress  HEENT: head atraumatic, PERRLA, normal appearance of eyes, ears, nose and mouth. moist mucus membranes.  NECK: supple, no masses or lymphadenopathy  LUNGS: clear to auscultation bilaterally, no rales, rhonchi or wheeze  CV: HRRR, no peripheral edema or cyanosis, normal pedal pulses  ABDOMEN: bowel sounds normal, soft, non tender to palpation, no masses, no rebound or guarding  GU: declined - sees urologist  SKIN: no rash or abnormal lesions - declined full exam  MS: normal gait, moves all extremities normally  NEURO: CN II-XII grossly intact, normal muscle strength and sensation to light touch on extremities  PSYCH: normal affect, pleasant and cooperative  ASSESSMENT AND PLAN:  Discussed the following assessment and plan:  Visit for preventive health examination  Hyperlipemia  Essential hypertension  Lateral epicondylitis, right  -Discussed and advised all [place] preventive services health task force level A and B recommendations for age, sex and risks.  -Advised at least 150 minutes of exercise per  week and a healthy diet low in saturated fats and sweets and consisting of fresh fruits and vegetables, lean meats such as fish and white chicken and whole grains.  -labs, studies and vaccines per orders this encounter   Patient advised to return to clinic immediately if symptoms worsen or persist or new concerns.  Patient Instructions  BEFORE  YOU LEAVE: -labs -follow up in 6 months  -call in 1 month if elbow still giving you trouble and will set up evaluation with our sports medicine doctor.  -please schedule follow up with your urologist (Dr. Jeffie Pollock)  -see letter ( attached) from your gastroenterologist regarding your colonoscopy  We recommend  the following healthy lifestyle measures: - eat a healthy whole foods diet consisting of regular small meals composed of vegetables, fruits, beans, nuts, seeds, healthy meats such as white chicken and fish and whole grains.  - avoid sweets, white starchy foods, fried foods, fast food, processed foods, sodas, red meet and other fattening foods.  - get a least 150-300 minutes of aerobic exercise per week.      No Follow-up on file.   [name] R.

## 2015-11-27 NOTE — Addendum Note (Signed)
Addended by: Lucretia Kern on: 11/27/2015 09:02 AM   Modules accepted: Orders

## 2015-11-27 NOTE — Patient Instructions (Addendum)
BEFORE  YOU LEAVE: -labs -follow up in 6 months  -call in 1 month if elbow still giving you trouble and will set up evaluation with our sports medicine doctor.  -please schedule follow up with your urologist (Dr. Jeffie Pollock)  -see letter ( attached) from your gastroenterologist regarding your colonoscopy  We recommend the following healthy lifestyle measures: - eat a healthy whole foods diet consisting of regular small meals composed of vegetables, fruits, beans, nuts, seeds, healthy meats such as white chicken and fish and whole grains.  - avoid sweets, white starchy foods, fried foods, fast food, processed foods, sodas, red meet and other fattening foods.  - get a least 150-300 minutes of aerobic exercise per week.

## 2016-02-18 ENCOUNTER — Other Ambulatory Visit: Payer: Self-pay | Admitting: *Deleted

## 2016-02-18 ENCOUNTER — Encounter: Payer: Self-pay | Admitting: Family Medicine

## 2016-02-18 MED ORDER — AMLODIPINE BESYLATE 5 MG PO TABS: 5.0000 mg | ORAL_TABLET | Freq: Every day | ORAL | Status: DC

## 2016-03-17 ENCOUNTER — Encounter: Payer: Self-pay | Admitting: Family Medicine

## 2016-03-17 ENCOUNTER — Ambulatory Visit (INDEPENDENT_AMBULATORY_CARE_PROVIDER_SITE_OTHER): Payer: BLUE CROSS/BLUE SHIELD | Admitting: Family Medicine

## 2016-03-17 VITALS — BP 110/72 | HR 76 | Temp 98.1°F | Ht 69.0 in | Wt 185.5 lb

## 2016-03-17 DIAGNOSIS — M545 Low back pain, unspecified: Secondary | ICD-10-CM

## 2016-03-17 MED ORDER — CYCLOBENZAPRINE HCL 10 MG PO TABS: 10.0000 mg | ORAL_TABLET | Freq: Three times a day (TID) | ORAL | Status: DC | PRN

## 2016-03-17 NOTE — Patient Instructions (Addendum)
BEFORE YOU LEAVE: -low back exercises -follow up: 4 weeks  Do the exercises provided at least 4 days per week.  Take the Flexeril at night for 5 days. You also can take it in the morning if your home and it does not make you drowsy as needed.  Heat for 15 minutes twice daily.  Please continue your normal activities but avoid heavy lifting or twisting.  For the pain: - Can use heat, topical sports creams with capsaicin or menthol or Tylenol [phone] mg up to 3 times daily

## 2016-03-17 NOTE — Progress Notes (Signed)
Pre visit review using our clinic review tool, if applicable. No additional management support is needed unless otherwise documented below in the visit note. 

## 2016-03-17 NOTE — Progress Notes (Addendum)
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  Past Medical History  Diagnosis Date  . Hyperlipidemia   . Hypertension   . Colon polyps   . Diverticulitis     Past Surgical History  Procedure Laterality Date  . Appendectomy    . Colonoscopy      Family History  Problem Relation Age of Onset  . Heart disease Father   . Colon cancer Neg Hx   . Pancreatic cancer Neg Hx   . Rectal cancer Neg Hx   . Stomach cancer Neg Hx     Social History   Social History  . Marital Status: Married    Spouse Name: N/A  . Number of Children: N/A  . Years of Education: N/A   Occupational History  . Retired    Social History Main Topics  . Smoking status: Never Smoker   . Smokeless tobacco: Never Used  . Alcohol Use: 4.2 oz/week    7 Glasses of wine per week  . Drug Use: No  . Sexual Activity: Yes   Other Topics Concern  . 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  amLODipine (NORVASC) 5 MG tablet, Take 1 tablet (5 mg total) by mouth daily., Disp: 90 tablet, Rfl: 1 .  aspirin 81 MG EC tablet, Take 81 mg by mouth daily.  , Disp: , Rfl:  .  bisoprolol (ZEBETA) 5 MG tablet, Take  1 tablet (5 mg total) by mouth daily., Disp: 90 tablet, Rfl: 1 .  cholecalciferol (VITAMIN D) 1000 UNITS tablet, Take 1,000 Units by mouth daily., Disp: , Rfl:  .  Flaxseed, Linseed, (FLAXSEED OIL PO), Take by mouth as directed., Disp: , Rfl:  .  MAGNESIUM PO, Take by mouth daily., Disp: , Rfl:  .  Multiple Vitamin (MULTIVITAMIN) tablet, Take 1 tablet by mouth daily.  , Disp: , Rfl:  .  naproxen (NAPROSYN) 500 MG tablet, Take 1 tablet (500 mg total) by mouth 2 (two) times daily with a meal., Disp: 30 tablet, Rfl: 0 .  Omega-3 Fatty Acids (FISH OIL PO), Take by mouth daily., Disp: , Rfl:  .  Probiotic Product (PROBIOTIC PO), Take by mouth daily., Disp: , Rfl:  .  rosuvastatin (CRESTOR) 20 MG tablet, Take 1 tablet (20 mg total) by mouth daily., Disp: 90 tablet, Rfl: 3 .  triamcinolone (NASACORT AQ) 55 MCG/ACT nasal inhaler, 2 sprays by Nasal route daily as needed.  , Disp: , Rfl:  .  zaleplon (SONATA) 5 MG capsule, Take 1 capsule (5 mg total) by mouth at  bedtime as needed for sleep., Disp: 5 capsule, Rfl: 0 .  cyclobenzaprine (FLEXERIL) 10 MG tablet, Take 1 tablet (10 mg total) by mouth 3 (three) times daily as needed for muscle spasms., Disp: 30 tablet, Rfl: 0  EXAM:  Filed Vitals:   03/17/16 1301  BP: 110/72  Pulse: 76  Temp: 98.1 F (36.7 C)    Body mass index is 27.38 kg/(m^2).  GENERAL: vitals reviewed and listed above, alert, oriented, appears well hydrated and in no acute distress  HEENT: atraumatic, conjunttiva clear, no obvious abnormalities on inspection of external nose and ears  NECK: no obvious masses on inspection  LUNGS: clear to auscultation bilaterally, no wheezes, rales or rhonchi, good air movement  CV: HRRR, no peripheral edema  MS: moves all extremities without noticeable abnormality Normal Gait Normal inspection of back, no obvious scoliosis or leg length descrepancy No bony TTP Soft tissue TTP at: R QL low back muscle -/+ tests: neg trendelenburg,-facet  loading, -SLRT, -CLRT, -FABER, -FADIR Normal muscle strength, sensation to light touch and DTRs in LEs bilaterally  PSYCH: pleasant and cooperative, no obvious depression or anxiety  ASSESSMENT AND PLAN:  Discussed the following assessment and plan:  Right-sided low back pain without sciatica  -we discussed possible serious and likely etiologies, workup and treatment, treatment risks and return precautions - Suspect muscle strain most likely -after this discussion, Juma opted for HEP, Tylenol and over-the-counter sports creams for pain as needed, heat, muscle relaxer as needed and close follow-up in 4 weeks unless symptoms resolve -of course, we advised Senan  to return or notify a doctor immediately if symptoms worsen or persist or new concerns arise.   Patient Instructions  BEFORE YOU LEAVE: -low back exercises -follow up: 4 weeks  Do the exercises provided at least 4 days per week.  Take the Flexeril at night for 5 days. You also can take it in the morning if your home and it does not make you drowsy as needed.  Heat for 15 minutes twice daily.  Please continue your normal activities but avoid heavy lifting or twisting.  For the pain: - Can use heat, topical sports creams with capsaicin or menthol or Tylenol [phone] mg up to 3 times daily     KIM, HANNAH R.

## 2016-03-31 ENCOUNTER — Other Ambulatory Visit: Payer: Self-pay | Admitting: Family Medicine

## 2016-03-31 NOTE — Telephone Encounter (Signed)
Dr. Maudie Mercury, pt requesting refill on Flexeril. Please advise of okay to refill?

## 2016-04-01 NOTE — Telephone Encounter (Signed)
If not helping would not advise him to keep taking. Would advise follow up appointment. If it is helping, ok to refill. Thanks.

## 2016-04-01 NOTE — Telephone Encounter (Signed)
Spoke to pt, told him Dr.Kim said to stop the Flexeril since not helping and schedule follow up appt. Pt verbalized understanding and said he already has an appt scheduled for the 20th. Asked pt if wanted to be see sooner? Pt said yes, if available. Appt scheduled for Tuesday June 13 th at 2:30 with Dr.Kim. Pt verbalized understanding.

## 2016-04-07 ENCOUNTER — Encounter: Payer: Self-pay | Admitting: Family Medicine

## 2016-04-07 ENCOUNTER — Ambulatory Visit (INDEPENDENT_AMBULATORY_CARE_PROVIDER_SITE_OTHER): Payer: BLUE CROSS/BLUE SHIELD | Admitting: Family Medicine

## 2016-04-07 VITALS — BP 120/80 | HR 76 | Temp 98.3°F | Ht 69.0 in | Wt 183.4 lb

## 2016-04-07 DIAGNOSIS — M545 Low back pain, unspecified: Secondary | ICD-10-CM

## 2016-04-07 NOTE — Progress Notes (Signed)
Pre visit review using our clinic review tool, if applicable. No additional management support is needed unless otherwise documented below in the visit note. 

## 2016-04-07 NOTE — Patient Instructions (Signed)
BEFORE YOU LEAVE: -schedule follow up in 3 months  -We placed a referral for you as discussed for the MRI. It usually takes about 1-2 weeks to process and schedule this referral. If you have not heard from [place] regarding this appointment in 2 weeks please contact our office.  -continue current treatment and get the MRI. If you would like to do formal physical therapy please let me know and I can send a referral.

## 2016-04-07 NOTE — Progress Notes (Signed)
HPI:  Back pain: -hx of chronic low back pain for many years that usually flares with painting -had mild OA on plain films 04/2015 -this flare started after waxing his wife's car in 01/2016; seen 5/23 and HEP, flexeril and symptomatic care provided -reports: mild dull pain in R low back, 50% better, occ sharp severe spasm of pain with certain movements (such as shaking out rug); massage, HEP and yoga are helping; medications including flexeril don't really help -denies: radiation, weakness, numbness, fevers, malaise, bowel or bladder dysfunction   ROS: See pertinent positives and negatives per HPI.  Past Medical History  Diagnosis Date  . Hyperlipidemia   . Hypertension   . Colon polyps   . Diverticulitis     Past Surgical History  Procedure Laterality Date  . Appendectomy    . Colonoscopy      Family History  Problem Relation Age of Onset  . Heart disease Father   . Colon cancer Neg Hx   . Pancreatic cancer Neg Hx   . Rectal cancer Neg Hx   . Stomach cancer Neg Hx     Social History   Social History  . Marital Status: Married    Spouse Name: N/A  . Number of Children: N/A  . Years of Education: N/A   Occupational History  . Retired    Social History Main Topics  . Smoking status: Never Smoker   . Smokeless tobacco: Never Used  . Alcohol Use: 4.2 oz/week    7 Glasses of wine per week  . Drug Use: No  . Sexual Activity: Yes   Other Topics Concern  . 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  amLODipine (NORVASC) 5 MG tablet, Take 1 tablet (5 mg total) by mouth daily., Disp: 90 tablet, Rfl: 1 .  aspirin 81 MG EC tablet, Take 81 mg by mouth daily.  , Disp: , Rfl:  .   bisoprolol (ZEBETA) 5 MG tablet, Take 1 tablet (5 mg total) by mouth daily., Disp: 90 tablet, Rfl: 1 .  cholecalciferol (VITAMIN D) 1000 UNITS tablet, Take 1,000 Units by mouth daily., Disp: , Rfl:  .  cyclobenzaprine (FLEXERIL) 10 MG tablet, Take 1 tablet (10 mg total) by mouth 3 (three) times daily as needed for muscle spasms., Disp: 30 tablet, Rfl: 0 .  Flaxseed, Linseed, (FLAXSEED OIL PO), Take by mouth as directed., Disp: , Rfl:  .  MAGNESIUM PO, Take by mouth daily., Disp: , Rfl:  .  Multiple Vitamin (MULTIVITAMIN) tablet, Take 1 tablet by mouth daily.  , Disp: , Rfl:  .  naproxen (NAPROSYN) 500 MG tablet, Take 1 tablet (500 mg total) by mouth 2 (two) times daily with a meal., Disp: 30 tablet, Rfl: 0 .  Omega-3 Fatty Acids (FISH OIL PO), Take by mouth daily., Disp: , Rfl:  .  Probiotic Product (PROBIOTIC PO), Take by mouth daily., Disp: , Rfl:  .  rosuvastatin (CRESTOR) 20 MG tablet, Take 1 tablet (20 mg total) by mouth daily., Disp: 90 tablet, Rfl: 3 .  triamcinolone (NASACORT AQ) 55 MCG/ACT nasal inhaler, 2  sprays by Nasal route daily as needed.  , Disp: , Rfl:  .  zaleplon (SONATA) 5 MG capsule, Take 1 capsule (5 mg total) by mouth at bedtime as needed for sleep., Disp: 5 capsule, Rfl: 0  EXAM:  Filed Vitals:   04/07/16 1427  BP: 120/80  Pulse: 76  Temp: 98.3 F (36.8 C)    Body mass index is 27.07 kg/(m^2).  GENERAL: vitals reviewed and listed above, alert, oriented, appears well hydrated and in no acute distress  HEENT: atraumatic, conjunttiva clear, no obvious abnormalities on inspection of external nose and ears  NECK: no obvious masses on inspection  MS: moves all extremities without noticeable abnormality Normal Gait Normal inspection of back, no obvious scoliosis or leg length descrepancy No bony TTP Soft tissue TTP at: r lumbar paraspinal muscules -/+ tests: neg trendelenburg,-facet loading, -SLRT, -CLRT, -FABER, -FADIR Normal muscle strength, sensation to light  touch and DTRs in LEs bilaterally  PSYCH: pleasant and cooperative, no obvious depression or anxiety  ASSESSMENT AND PLAN:  Discussed the following assessment and plan:  Right-sided low back pain without sciatica - Plan: MR Lumbar Spine Wo Contrast  -we discussed possible serious and likely etiologies, workup and treatment, treatment risks and return precautions -after this discussion, Blaik opted for MRI to further evaluate given duration of symptoms, severe at times, continuation of current treatment for now except no meds - he doesn't feel he needs them -follow up advised in 3 months, sooner if needed -of course, we advised Levan  to return or notify a doctor immediately if symptoms worsen or persist or new concerns arise.  .  -Patient advised to return or notify a doctor immediately if symptoms worsen or persist or new concerns arise.  Patient Instructions  BEFORE YOU LEAVE: -schedule follow up in 3 months  -We placed a referral for you as discussed for the MRI. It usually takes about 1-2 weeks to process and schedule this referral. If you have not heard from [place] regarding this appointment in 2 weeks please contact our office.  -continue current treatment and get the MRI. If you would like to do formal physical therapy please let me know and I can send a referral.      [name] R.

## 2016-04-14 ENCOUNTER — Ambulatory Visit: Payer: BLUE CROSS/BLUE SHIELD | Admitting: Family Medicine

## 2016-04-16 ENCOUNTER — Encounter: Payer: Self-pay | Admitting: Family Medicine

## 2016-04-17 MED ORDER — BISOPROLOL FUMARATE 5 MG PO TABS: 5.0000 mg | ORAL_TABLET | Freq: Every day | ORAL | Status: DC

## 2016-05-01 ENCOUNTER — Ambulatory Visit
Admission: RE | Admit: 2016-05-01 | Discharge: 2016-05-01 | Disposition: A | Discharge status: Home / Self Care | Payer: BLUE CROSS/BLUE SHIELD | Source: Ambulatory Visit | Attending: Family Medicine | Admitting: Family Medicine

## 2016-05-01 DIAGNOSIS — M545 Low back pain, unspecified: Secondary | ICD-10-CM

## 2016-05-04 ENCOUNTER — Other Ambulatory Visit: Payer: Self-pay | Admitting: Family Medicine

## 2016-05-04 DIAGNOSIS — M5136 Other intervertebral disc degeneration, lumbar region: Secondary | ICD-10-CM

## 2016-05-04 DIAGNOSIS — M545 Low back pain: Secondary | ICD-10-CM

## 2016-05-04 DIAGNOSIS — M47816 Spondylosis without myelopathy or radiculopathy, lumbar region: Secondary | ICD-10-CM

## 2016-05-18 ENCOUNTER — Other Ambulatory Visit: Payer: Self-pay | Admitting: Family Medicine

## 2016-05-18 DIAGNOSIS — E785 Hyperlipidemia, unspecified: Secondary | ICD-10-CM

## 2016-05-21 ENCOUNTER — Other Ambulatory Visit: Payer: Self-pay | Admitting: *Deleted

## 2016-05-21 ENCOUNTER — Encounter: Payer: Self-pay | Admitting: Family Medicine

## 2016-05-21 MED ORDER — TIZANIDINE HCL 2 MG PO CAPS: 2.0000 mg | ORAL_CAPSULE | Freq: Three times a day (TID) | ORAL | 0 refills | Status: DC | PRN

## 2016-05-22 ENCOUNTER — Encounter: Payer: Self-pay | Admitting: Family Medicine

## 2016-07-20 ENCOUNTER — Encounter: Payer: Self-pay | Admitting: Family Medicine

## 2016-07-21 ENCOUNTER — Other Ambulatory Visit: Payer: Self-pay | Admitting: *Deleted

## 2016-07-21 DIAGNOSIS — G4700 Insomnia, unspecified: Secondary | ICD-10-CM

## 2016-07-27 ENCOUNTER — Other Ambulatory Visit: Payer: Self-pay | Admitting: Family Medicine

## 2016-07-28 NOTE — Telephone Encounter (Signed)
Looks like due for follow up. Please call him and assist in scheduling. Ok to send one refill.

## 2016-07-28 NOTE — Telephone Encounter (Signed)
I left a message for the pt to return my call. 

## 2016-07-30 MED ORDER — ZALEPLON 5 MG PO CAPS: 5.0000 mg | ORAL_CAPSULE | Freq: Every evening | ORAL | 0 refills | Status: DC | PRN

## 2016-07-30 NOTE — Telephone Encounter (Signed)
Rx done and I left a detailed message with the information below and to call the office to schedule an appt.

## 2016-09-01 ENCOUNTER — Other Ambulatory Visit: Payer: Self-pay | Admitting: *Deleted

## 2016-09-01 MED ORDER — ROSUVASTATIN CALCIUM 20 MG PO TABS: ORAL_TABLET | ORAL | 0 refills | Status: DC

## 2016-09-01 MED ORDER — BISOPROLOL FUMARATE 5 MG PO TABS: 5.0000 mg | ORAL_TABLET | Freq: Every day | ORAL | 0 refills | Status: DC

## 2016-09-01 MED ORDER — AMLODIPINE BESYLATE 5 MG PO TABS: 5.0000 mg | ORAL_TABLET | Freq: Every day | ORAL | 0 refills | Status: DC

## 2016-09-01 NOTE — Telephone Encounter (Signed)
Rx done. 

## 2016-09-10 ENCOUNTER — Encounter: Payer: Self-pay | Admitting: Family Medicine

## 2016-09-29 ENCOUNTER — Encounter: Payer: Self-pay | Admitting: Family Medicine

## 2016-09-29 ENCOUNTER — Ambulatory Visit (INDEPENDENT_AMBULATORY_CARE_PROVIDER_SITE_OTHER): Payer: BLUE CROSS/BLUE SHIELD | Admitting: Family Medicine

## 2016-09-29 VITALS — BP 140/80 | HR 77 | Temp 98.4°F | Ht 69.0 in | Wt 185.1 lb

## 2016-09-29 DIAGNOSIS — M5136 Other intervertebral disc degeneration, lumbar region: Secondary | ICD-10-CM | POA: Diagnosis not present

## 2016-09-29 DIAGNOSIS — M545 Low back pain: Secondary | ICD-10-CM | POA: Diagnosis not present

## 2016-09-29 DIAGNOSIS — G8929 Other chronic pain: Secondary | ICD-10-CM | POA: Diagnosis not present

## 2016-09-29 DIAGNOSIS — M25571 Pain in right ankle and joints of right foot: Secondary | ICD-10-CM

## 2016-09-29 MED ORDER — METHOCARBAMOL 500 MG PO TABS: 500.0000 mg | ORAL_TABLET | Freq: Two times a day (BID) | ORAL | 0 refills | Status: DC | PRN

## 2016-09-29 NOTE — Progress Notes (Signed)
Pre visit review using our clinic review tool, if applicable. No additional management support is needed unless otherwise documented below in the visit note. 

## 2016-09-29 NOTE — Patient Instructions (Signed)
BEFORE YOU LEAVE: -follow up: 3 months  -We placed a referral for you as discussed for a 2nd opinion regarding the back issues. It usually takes about 1-2 weeks to process and schedule this referral. If you have not heard from [place] regarding this appointment in 2 weeks please contact our office.  For the ankle: -wear a medium grade support stocking up to the knee -elevate leg above waist for 30 minutes daily -alphabet exercises daily -gradually add back gentle aerobic exercise -call or email in 2 weeks with progress update

## 2016-09-29 NOTE — Progress Notes (Signed)
HPI:  Clinton Newton is a pleasant 65 year old here for an acute visit for several issues. First of all he sprained his right ankle about 7 weeks ago when he took a step wrong at a hotel. He was evaluated in an outside hospital emergency department and had negative x-rays. He has not really done any specific treatments for this except for has minimized activity. Overall he is doing much better, but occasionally still has a twinge of pain with certain movements and has some swelling still, though he reports this is much better. No weakness, numbness or further injuries since. He is not very happy with the back doctor that he saw about his degenerative disc disease. He saw Dr. Rolena Infante at [place] and he feels that surgery was pushed very strongly. He reports his symptoms are only low back pain without radiation or weakness and his symptoms resolved when he is active and he does not wish to pursue surgery at this time. He would like to see another specialist for a second opinion prefers a nonsurgical approach. He is interested in radiofrequency nerve ablation. He also wants a refill on methocarbamol in the interim, as he reports this is very helpful on bad days. He reports his orthopedic doctor refused to refill this. Denies radiation, weakness, numbness, bowel or bladder dysfunction or worsening issues with his chronic back pain.  ROS: See pertinent positives and negatives per HPI.  Past Medical History:  Diagnosis Date  . Colon polyps   . Diverticulitis   . Hyperlipidemia   . Hypertension     Past Surgical History:  Procedure Laterality Date  . APPENDECTOMY    . COLONOSCOPY      Family History  Problem Relation Age of Onset  . Heart disease Father   . Colon cancer Neg Hx   . Pancreatic cancer Neg Hx   . Rectal cancer Neg Hx   . Stomach cancer Neg Hx     Social History   Social History  . Marital status: Married    Spouse name: N/A  . Number of children: N/A  . Years of  education: N/A   Occupational History  . Retired    Social History Main Topics  . Smoking status: Never Smoker  . Smokeless tobacco: Never Used  . Alcohol use 4.2 oz/week    7 Glasses of wine per week  . Drug use: No  . Sexual activity: Yes   Other Topics Concern  . 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  amLODipine (NORVASC) 5 MG tablet, Take 1 tablet (5 mg total) by mouth daily., Disp: 90 tablet, Rfl: 0 .  aspirin 81 MG EC tablet, Take 81 mg by mouth daily.  , Disp: , Rfl:  .  bisoprolol (ZEBETA) 5 MG tablet, Take 1 tablet (5 mg total) by mouth daily., Disp: 90 tablet, Rfl: 0 .  cholecalciferol (VITAMIN D) 1000 UNITS tablet, Take 1,000 Units by mouth daily., Disp: , Rfl:  .  Flaxseed, Linseed, (FLAXSEED OIL PO), Take by mouth as directed., Disp: , Rfl:  .  MAGNESIUM PO, Take by  mouth daily., Disp: , Rfl:  .  Multiple Vitamin (MULTIVITAMIN) tablet, Take 1 tablet by mouth daily.  , Disp: , Rfl:  .  naproxen (NAPROSYN) 500 MG tablet, Take 1 tablet (500 mg total) by mouth 2 (two) times daily with a meal., Disp: 30 tablet, Rfl: 0 .  Omega-3 Fatty Acids (FISH OIL PO), Take by mouth daily., Disp: , Rfl:  .  Probiotic Product (PROBIOTIC PO), Take by mouth daily., Disp: , Rfl:  .  rosuvastatin (CRESTOR) 20 MG tablet, TAKE 1 BY MOUTH DAILY, Disp: 90 tablet, Rfl: 0 .  triamcinolone (NASACORT AQ) 55 MCG/ACT nasal inhaler, 2 sprays by Nasal route daily as needed.  , Disp: , Rfl:  .  methocarbamol (ROBAXIN) 500 MG tablet, Take 1 tablet (500 mg total) by mouth 2 (two) times daily as needed for muscle spasms., Disp: 30 tablet, Rfl: 0  EXAM:  Vitals:   09/29/16 1346  BP: 140/80  Pulse: 77  Temp: 98.4 F  (36.9 C)    Body mass index is 27.33 kg/m.  GENERAL: vitals reviewed and listed above, alert, oriented, appears well hydrated and in no acute distress  HEENT: atraumatic, conjunttiva clear, no obvious abnormalities on inspection of external nose and ears  NECK: no obvious masses on inspection  LUNGS: clear to auscultation bilaterally, no wheezes, rales or rhonchi, good air movement  CV: HRRR, no peripheral edema  MS: moves all extremities without noticeable abnormality; his right ankle is swollen without redness, he has minimal tenderness around the right lateral ankle joint without any focal bony tenderness to palpation, slightly decreased range of motion in this ankle, no laxity on for testing or Taylor tilt test, neurovascularly intact distally, no pain over palpation of the deep veins in his leg  PSYCH: pleasant and cooperative, no obvious depression or anxiety  ASSESSMENT AND PLAN:  Discussed the following assessment and plan:  Acute right ankle pain -We will try to treat with compression and starting alphabet exercises along with gradual return to activities -He is to call in 2 weeks and if he is not making progress we will have him medicine for an ultrasound evaluation   Chronic bilateral low back pain without sciatica - Plan: Ambulatory referral to Physical Medicine Rehab DDD (degenerative disc disease), lumbar - Plan: Ambulatory referral to Physical Medicine Rehab -Referral per his request for second opinion, we discussed various options and he feels physiatry would be best and after discussion of options we have referred him to Dr. Letta Pate -Small amount of methocarbamol provided after discussion of risks to use in the interim as needed  -Patient advised to return or notify a doctor immediately if symptoms worsen or persist or new concerns arise.  Patient Instructions  BEFORE YOU LEAVE: -follow up: 3 months  -We placed a referral for you as discussed for a 2nd  opinion regarding the back issues. It usually takes about 1-2 weeks to process and schedule this referral. If you have not heard from [place] regarding this appointment in 2 weeks please contact our office.  For the ankle: -wear a medium grade support stocking up to the knee -elevate leg above waist for 30 minutes daily -alphabet exercises daily -gradually add back gentle aerobic exercise -call or email in 2 weeks with progress update     [name] R., DO

## 2016-10-28 ENCOUNTER — Encounter: Payer: Self-pay | Admitting: Family Medicine

## 2016-10-29 ENCOUNTER — Ambulatory Visit (INDEPENDENT_AMBULATORY_CARE_PROVIDER_SITE_OTHER): Payer: BLUE CROSS/BLUE SHIELD | Admitting: Sports Medicine

## 2016-10-29 ENCOUNTER — Ambulatory Visit: Payer: Self-pay

## 2016-10-29 VITALS — BP 144/84 | Ht 70.0 in | Wt 180.0 lb

## 2016-10-29 DIAGNOSIS — M25571 Pain in right ankle and joints of right foot: Secondary | ICD-10-CM

## 2016-10-29 DIAGNOSIS — G9050 Complex regional pain syndrome I, unspecified: Secondary | ICD-10-CM | POA: Insufficient documentation

## 2016-10-29 MED ORDER — AMITRIPTYLINE HCL 25 MG PO TABS: 25.0000 mg | ORAL_TABLET | Freq: Every day | ORAL | 1 refills | Status: DC

## 2016-10-29 NOTE — Progress Notes (Signed)
Clinton Newton is a 66 y.o. male who is here for R ankle and L foot pain.   HPI:  Mr. Gerges reports intermittent L foot discomfort for "years" with worsening in the last 2-3 months, without change in activity or footwear, or new injury. Describes as feeling hot or tingling on the ball of his foot. Worsens with prolonged walking or if he raises on his toes. No effect with change in footwear. No other L foot, ankle, or lower leg pain. No swelling or skin changes noticed. Has tried massage of area when symptomatic, but with little relief.  No known trauma prior to onset of symptoms.   Reports R ankle pain for 11wks. Was in [place] when he missed a step and twisted his ankle, with an inversion motion. Had immediate pain on lateral ankle, but was able to bear weight immediately after injury. Shortly after injury, he hoticed bruising on lateral ankle and large amount of swelling which extended to lower calf. He continued walking frequently on his vacation, but diffuse ankle pain, swelling, and limited range of motion persisted. Tried topical antiinflammatories with only moderate relief of pain, but no improvement in swelling. Since symptoms were not improving, he went to the ER in [place], where he had xrays and was told he had no fracture. Physician there recommended a compression sock, which he has worn intermittently since. Since returning from vacation, he saw his PCP who recommended ankle sprain rehab exercises (alphabet, plantar-dorsiflexion).  However, he says his ankle continues to swell, most notably by the end of the day, and he has decreased ROM. Denies any current calf pain or numbness/tingling in toes.  Has decreased his regular acitivities, like water aerobics, because it increases the swelling afterwards.  No other medications tried.  Med hx : HTN, HLD Medications: amlodipine, ASA, bisoprolol, vit D, robaxin  ROS No generalized joint swelling No swelling great toe or other small joints No pain  along fibula  Physical Exam:  BP (!) 144/84   Ht 5\' 10"  (1.778 m)   Wt 180 lb (81.6 kg)   BMI 25.83 kg/m   Gen: WD, NAD MSK: R ankle: significant diffuse edema and increased warmth of ankle, hindfoot, and midfoot. Decreased plantar flexion, inversion and eversion.  Restricted dorsiflexion, but similar in L ankle. Sensation intact throughout R foot and toes. Normal toe movement. Normal cap refill. Strong distal pulses. Normal achilles, post. tibialis and calf strength.  Note [place] rules negative with stable ligaments and non tender malloli and bones   L foot and ankle. No swelling or skin changes. Non tender foot throughout, however, pt can reproduce L foot symptoms when he stands on his toes.  This localizes over distal plantar 2nd MTP joint Normal arches without significant pronation.  Ultrasound: R ankle:  soft tissue edema, but no joint effusion.  No fractures along fibula Talar dome normal . No bone spurs or significant degenerative changes laterally but small spur tip of medial malleolus  Peroneal tendons normal Ligaments intact  Summary:  Right ankle scan is not remarkable except for soft tissue swelling  .Ultrasound and interpretation by Wolfgang Phoenix. Fields, MD   Assessment/Plan: 66yr old male with hx of HTN, HLD, and chronic back pain presents with R ankle pain x 3 months and L foot pain for >3years. 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  1) R ankle sympathetic dystrophy -Amitriptyline 25mg  QHS; reviewed risks, benefits, and alternatives to this medication, including common side effects such as  drowsiness and dry mouth. He should stop med and contact PCP or this physician if he develops concerning adverse effects. -Handout reviewed and given to patient on ankle sprain rehab exercises, including ROM and strengthening -Recommend compression sleeve for ankle  2) L metatarsal neuroma -Metatarsal pad applied to insole of shoe. Pt reported  relief of symptoms after walking with this new insole in clinic.  Follow-up in 2 months  10/29/16   I observed and examined the patient with the resident and agree with assessment and plan.  Note reviewed and modified by me. Stefanie Libel, MD

## 2016-11-05 ENCOUNTER — Ambulatory Visit: Payer: BLUE CROSS/BLUE SHIELD | Admitting: Sports Medicine

## 2016-11-06 ENCOUNTER — Encounter: Payer: Self-pay | Admitting: Physical Medicine & Rehabilitation

## 2016-11-09 ENCOUNTER — Encounter: Payer: Self-pay | Admitting: Sports Medicine

## 2016-11-19 ENCOUNTER — Other Ambulatory Visit: Payer: Self-pay | Admitting: *Deleted

## 2016-11-19 MED ORDER — ROSUVASTATIN CALCIUM 20 MG PO TABS: ORAL_TABLET | ORAL | 0 refills | Status: DC

## 2016-11-19 MED ORDER — AMLODIPINE BESYLATE 5 MG PO TABS: 5.0000 mg | ORAL_TABLET | Freq: Every day | ORAL | 0 refills | Status: DC

## 2016-11-19 MED ORDER — BISOPROLOL FUMARATE 5 MG PO TABS: 5.0000 mg | ORAL_TABLET | Freq: Every day | ORAL | 0 refills | Status: DC

## 2016-11-20 ENCOUNTER — Encounter: Payer: Self-pay | Admitting: Physical Medicine & Rehabilitation

## 2016-11-20 ENCOUNTER — Encounter: Payer: BLUE CROSS/BLUE SHIELD | Attending: Physical Medicine & Rehabilitation

## 2016-11-20 ENCOUNTER — Ambulatory Visit (HOSPITAL_BASED_OUTPATIENT_CLINIC_OR_DEPARTMENT_OTHER): Payer: BLUE CROSS/BLUE SHIELD | Admitting: Physical Medicine & Rehabilitation

## 2016-11-20 VITALS — BP 157/91 | HR 80

## 2016-11-20 DIAGNOSIS — M1288 Other specific arthropathies, not elsewhere classified, other specified site: Secondary | ICD-10-CM

## 2016-11-20 DIAGNOSIS — M545 Low back pain, unspecified: Secondary | ICD-10-CM

## 2016-11-20 DIAGNOSIS — G8929 Other chronic pain: Secondary | ICD-10-CM | POA: Insufficient documentation

## 2016-11-20 DIAGNOSIS — M5136 Other intervertebral disc degeneration, lumbar region: Secondary | ICD-10-CM | POA: Insufficient documentation

## 2016-11-20 DIAGNOSIS — M51369 Other intervertebral disc degeneration, lumbar region without mention of lumbar back pain or lower extremity pain: Secondary | ICD-10-CM | POA: Insufficient documentation

## 2016-11-20 DIAGNOSIS — M7989 Other specified soft tissue disorders: Secondary | ICD-10-CM | POA: Insufficient documentation

## 2016-11-20 DIAGNOSIS — M47816 Spondylosis without myelopathy or radiculopathy, lumbar region: Secondary | ICD-10-CM | POA: Insufficient documentation

## 2016-11-20 MED ORDER — METHYLPREDNISOLONE 4 MG PO TBPK: ORAL_TABLET | ORAL | 0 refills | Status: DC

## 2016-11-20 NOTE — Patient Instructions (Signed)
[facility] should have your prescription for the cortisone dose pack. He will take this for around 6 days.  Physical therapy should call you to schedule appointment  Recheck in 1 month  Considerations are for lumbar medial branch blocks

## 2016-11-20 NOTE — Progress Notes (Signed)
Subjective:    Patient ID: Clinton Newton, male    DOB: Nov 20, 1950, 66 y.o.   MRN: [id number]  HPI CC:  Low back pain  Has had intermittent episodes of intense low back which lasts for seconds.  Usually occurs with bending .  4 episodes over the last year.  Also has Right lower back pain that is fairly constant  Walking is helpful but running can make it worse, doesn't usually run  Takes methocarbamol ~1 time a week, takes in evening  Has not tried PT  No DC treatments  Epidural helped for 1-2 days  Evaluated by orthopedic spine surgeon who reportedly advised lumbar surgery, do not have those notes available to me for review  Works out about 5 x per week, pool , water aerobics, floor work    Pain Inventory Average Pain 2 Pain Right Now 1 My pain is sharp and dull  In the last 24 hours, has pain interfered with the following? General activity 0 Relation with others 0 Enjoyment of life 1 What TIME of day is your pain at its worst? daytime Sleep (in general) Good  Pain is worse with: bending and standing Pain improves with: medication Relief from Meds: 8  Mobility walk without assistance do you drive?  yes  Function not employed: date last employed 2015  Neuro/Psych No problems in this area  Prior Studies Any changes since last visit?  no  Physicians involved in your care Any changes since last visit?  no   Family History  Problem Relation Age of Onset  . Heart disease Father   . Colon cancer Neg Hx   . Pancreatic cancer Neg Hx   . Rectal cancer Neg Hx   . Stomach cancer Neg Hx    Social History   Social History  . Marital status: Married    Spouse name: N/A  . Number of children: N/A  . Years of education: N/A   Occupational History  . Retired    Social History Main Topics  . Smoking status: Never Smoker  . Smokeless tobacco: Never Used  . Alcohol use 4.2 oz/week    7 Glasses of wine per week  . Drug use: No  . Sexual activity: Yes    Other Topics Concern  . 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 APPENDECTOMY    . COLONOSCOPY     Past Medical History:  Diagnosis Date  . Colon polyps   . Diverticulitis   . Hyperlipidemia   . Hypertension    There were no vitals taken for this visit.  Opioid Risk Score:   Fall Risk Score:  `1  Depression screen PHQ 2/9  Depression screen PHQ 2/9 10/29/2016  Decreased Interest 0  Down, Depressed, Hopeless 0  PHQ - 2 Score 0   Review of Systems  Constitutional: Negative.   HENT: Negative.   Eyes: Negative.   Respiratory: Negative.   Cardiovascular: Negative.   Gastrointestinal: Negative.   Endocrine: Negative.   Genitourinary: Negative.   Musculoskeletal: Positive for back pain.  Skin: Negative.  Allergic/Immunologic: Negative.   Neurological: Negative.   Hematological: Negative.   Psychiatric/Behavioral: Negative.   All other systems reviewed and are negative.      Objective:   Physical Exam  Constitutional: He is oriented to person, place, and time. He appears well-developed and well-nourished.  HENT:  Head: Normocephalic and atraumatic.  Eyes: Conjunctivae and EOM are normal. Pupils are equal, round, and reactive to light.  Neck: Normal range of motion. Neck supple.  Cardiovascular: Normal rate and regular rhythm.   Pulmonary/Chest: Effort normal and breath sounds normal. No respiratory distress.  Abdominal: Soft. Bowel sounds are normal. He exhibits no distension.  Musculoskeletal: He exhibits edema.       Lumbar back: He exhibits decreased range of motion. He exhibits no deformity and no spasm.       Right foot: There is decreased range of  motion. There is no tenderness.  Right foot and ankle are warm. No pain with range of motion. No tenderness. Palpation does have 2 plus pitting edema, mild reddish discoloration, no hyperhidrosis or hyperhidrosis. No hypersensitivity to touch.  There is pain in the lumbar area around L4 with right lateral leaning as well as lumbar extension. Patient has normal lumbar flexion, but lumbar extension is limited to 25%  Neurological: He is alert and oriented to person, place, and time. He displays no atrophy. No sensory deficit. He exhibits normal muscle tone. Coordination and gait normal.  Normal sensation to pinprick in bilateral upper and lower limbs  No evidence of toe drag or knee instability  Skin: Skin is warm and dry.  Psychiatric: He has a normal mood and affect. His behavior is normal. Judgment and thought content normal.  Nursing note and vitals reviewed.         Assessment & Plan:  1. Chronic low back pain. He has mild constant pain but more severe exacerbations. No evidence of radiculopathy. We discussed abnormalities seen on his MRI, which includes the L4-5 disc which is mainly left-sided and central. We also discussed the right-sided L3-L4 facet arthropathy. Given his right-sided pain mainly with extension, feel the L3-L4 facet arthropathy is symptomatic. Given that he has only mild constant pain would advise physical therapy. If he has worsening of symptoms, would advise right-sided medial branch blocks at the corresponding L2, L3 medial branches  Reviewed lumbar MRI, education on lumbar spine anatomy and potential generators  2. Right ankle swelling but no significant pain. I believe this is a  dysautonomia although he lacks the allodynia, which is typically associated Will empirically treat with Medrol Dosepak given the limited effectiveness of the amitriptyline. (Per patient report.) He will follow up with Dr. Oneida Alar on this

## 2016-12-01 ENCOUNTER — Encounter: Payer: Self-pay | Admitting: Physical Therapy

## 2016-12-01 ENCOUNTER — Ambulatory Visit: Payer: BLUE CROSS/BLUE SHIELD | Attending: Physical Medicine & Rehabilitation | Admitting: Physical Therapy

## 2016-12-01 DIAGNOSIS — M62830 Muscle spasm of back: Secondary | ICD-10-CM

## 2016-12-01 DIAGNOSIS — G8929 Other chronic pain: Secondary | ICD-10-CM | POA: Insufficient documentation

## 2016-12-01 DIAGNOSIS — M545 Low back pain: Secondary | ICD-10-CM | POA: Diagnosis present

## 2016-12-01 DIAGNOSIS — R2689 Other abnormalities of gait and mobility: Secondary | ICD-10-CM | POA: Insufficient documentation

## 2016-12-01 NOTE — Therapy (Signed)
[place], [place], [address] Phone: [phone]   Fax:  [phone]  Physical Therapy Evaluation  Patient Details  Name: Clinton Newton MRN: [id number] Date of Birth: July 15, 1951 Referring Provider: Alysia Penna MD  Encounter Date: 12/01/2016      PT End of Session - 12/01/16 1629    Visit Number 1   Number of Visits 7   Date for PT Re-Evaluation 01/12/17   PT Start Time [id number]   PT Stop Time 1628   PT Time Calculation (min) 41 min   Activity Tolerance Patient tolerated treatment well   Behavior During Therapy [facility] for tasks assessed/performed      Past Medical History:  Diagnosis Date  . Colon polyps   . Diverticulitis   . Hyperlipidemia   . Hypertension     Past Surgical History:  Procedure Laterality Date  . APPENDECTOMY    . COLONOSCOPY      There were no vitals filed for this visit.       Subjective Assessment - 12/01/16 1554    Subjective "pt is a 66 y.o male with CC of R low back pain that started 6 months ago after waxing wifes car. Started to spasm afterwards. Medication helped at first, and will occasionally take now. MD wanted to discuss surgery. denies N/T. previous episodes of back pain. no spasms for last few months. Reports it seems to have gotten some better.    Limitations Other (comment);Standing  twisting, bending   How long can you sit comfortably? unlimited   How long can you stand comfortably? unlimited   How long can you walk comfortably? unlimited   Diagnostic tests xray and MRI   Patient Stated Goals strengthening core,    Currently in Pain? Yes   Pain Score 1    Pain Location Back   Pain Orientation Right;Lower   Pain Descriptors / Indicators Aching;Dull   Pain Type Chronic pain   Pain Onset More than a month ago   Pain Frequency Constant   Aggravating Factors  twisting, bending   Pain Relieving Factors medication,             [facility] PT Assessment -  12/01/16 0001      Assessment   Medical Diagnosis facet arthropathy   Referring Provider Alysia Penna MD   Onset Date/Surgical Date --  6 months ago   Hand Dominance Right   Next MD Visit --  next month   Prior Therapy yes     Precautions   Precautions None     Restrictions   Weight Bearing Restrictions No     Balance Screen   Has the patient fallen in the past 6 months Yes   How many times? 1   Has the patient had a decrease in activity level because of a fear of falling?  No   Is the patient reluctant to leave their home because of a fear of falling?  No     Home Environment   Living Environment Private residence   Living Arrangements Spouse/significant other   Available Help at Discharge Family   Type of [place] to enter   Entrance Stairs-Number of Steps 3   Entrance Stairs-Rails Can reach both   Home Layout Two level   Alternate Level Stairs-Number of Steps 15   Alternate Level Stairs-Rails Can reach both   [place] None     Prior Function   Level of Independence  Independent;Independent with basic ADLs   Vocation Retired   Leisure painting  requires bending     Observation/Other Assessments   Focus on Therapeutic Outcomes (FOTO)  28% limited  predicted 24% limited     Posture/Postural Control   Posture/Postural Control Postural limitations   Postural Limitations Rounded Shoulders;Forward head     ROM / Strength   AROM / PROM / Strength AROM;PROM;Strength     AROM   AROM Assessment Site Lumbar   Lumbar Flexion 66  End range tightness   Lumbar Extension 25  End range pain   Lumbar - Right Side Bend 8  pain during movement   Lumbar - Left Side Bend 18     Strength   Strength Assessment Site Hip;Knee   Right/Left Hip Right;Left   Right Hip Flexion 5/5   Right Hip Extension 4-/5   Right Hip ABduction 4/5   Right Hip ADduction 5/5   Left Hip Flexion 5/5   Left Hip Extension 4/5   Left Hip ABduction 4+/5   Left  Hip ADduction 5/5   Right/Left Knee Right;Left     Palpation   Spinal mobility hypomobility at L3-L5 PA PAIVM   Palpation comment tightness in the QL on the R, tenderness at the R paraspinals around L1, T12 at the Facet joints     Special Tests    Special Tests Lumbar   Lumbar Tests Prone Knee Bend Test     Prone Knee Bend Test   Findings Positive   Side Right                   [facility] Adult PT Treatment/Exercise - 12/01/16 0001      Lumbar Exercises: Stretches   Lower Trunk Rotation 2 reps;30 seconds   Hip Flexor Stretch 2 reps;30 seconds     Knee/Hip Exercises: Stretches   Other Knee/Hip Stretches childs pose 2 x 30  reaching to the L                PT Education - 12/01/16 1631    Education provided Yes   Education Details exam findings, POC, HEP   Person(s) Educated Patient   Methods Explanation;Verbal cues;Handout   Comprehension Verbalized understanding;Verbal cues required         PT Short Term Goals - 12/01/16 1750      PT SHORT TERM GOAL #1   Title pt will be independent with inital HEP (12/22/16)   Time 3   Period Weeks   Status New     PT SHORT TERM GOAL #2   Title pt will improve bil and hip extension and abduction strength to >/= 4+/5 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 (01/12/17)   Time 6   Period Weeks   Status New     PT LONG TERM GOAL #3   Title pt will improve FOTO to </= to 24% limited in order to show functional improvement. (01/12/17)   Time 6   Period Weeks   Status New               Plan - 12/01/16 1730    Clinical Impression Statement Clinton Newton presents as a low complexity eval based on CC and PMHx. pt reported R low back pain at a 1/10 that can increase to 3-4/10 when twisting. He avoids extension due to increased pain. 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 Palpation revealed tenderness at R QL and R lumbar paraspinals. pt would benefit from PT to decrease pain and spasm in lumbar region, increase hip strength, and improve body mechanics.      Rehab Potential Good   PT Frequency 1x / week   PT Duration 6 weeks   PT Treatment/Interventions ADLs/Self Care Home Management;Cryotherapy;Electrical Stimulation;Iontophoresis 4mg /ml Dexamethasone;Moist Heat;Functional mobility training;Therapeutic exercise;Manual techniques;Dry needling;Patient/family education   PT Next Visit Plan assess HEP, QL stretching, hip flexor stretching, PA lumbar mobs, hip strengthening   PT Home Exercise Plan lower trunk rotation, childs pose, hip flexor stretch   Consulted and Agree with Plan of Care Patient      Patient will benefit from skilled therapeutic intervention in order to improve the following deficits and impairments:  Decreased endurance, Decreased range of motion, Decreased activity tolerance, Pain, Hypomobility, Improper body mechanics, Decreased mobility, Decreased strength, Postural dysfunction  Visit Diagnosis: Chronic right-sided low back pain without sciatica  Decreased mobility  Muscle spasm of back     Problem List Patient Active Problem List   Diagnosis Date Noted  . Lumbar facet arthropathy 11/20/2016  . Lumbar degenerative disc disease 11/20/2016  .  Chronic right-sided low back pain without sciatica 11/20/2016  . Reflex sympathetic dystrophy 10/29/2016  . Insomnia 03/14/2014  . Knee pain - sees ortho 03/14/2014  . Hyperlipemia 08/31/2007  . Essential hypertension 08/31/2007    Clinton Newton, Clinton Newton 12/01/2016, 6:06 PM  [facility] [address] [place], [place], [address] Phone: [phone]   Fax:  [phone]  Name: Clinton Newton MRN: [id number] Date of Birth: 1951-09-29

## 2016-12-02 ENCOUNTER — Encounter: Payer: Self-pay | Admitting: Physical Medicine & Rehabilitation

## 2016-12-07 ENCOUNTER — Encounter: Payer: Self-pay | Admitting: Gastroenterology

## 2016-12-09 ENCOUNTER — Ambulatory Visit: Payer: BLUE CROSS/BLUE SHIELD | Admitting: Physical Therapy

## 2016-12-09 DIAGNOSIS — M62830 Muscle spasm of back: Secondary | ICD-10-CM

## 2016-12-09 DIAGNOSIS — M545 Low back pain, unspecified: Secondary | ICD-10-CM

## 2016-12-09 DIAGNOSIS — R2689 Other abnormalities of gait and mobility: Secondary | ICD-10-CM

## 2016-12-09 DIAGNOSIS — G8929 Other chronic pain: Secondary | ICD-10-CM

## 2016-12-09 NOTE — Therapy (Signed)
[place], [place], [address] Phone: [phone]   Fax:  [phone]  Physical Therapy Treatment  Patient Details  Name: Clinton Newton MRN: [id number] Date of Birth: 1950/12/21 Referring Provider: Alysia Penna MD  Encounter Date: 12/09/2016      PT End of Session - 12/09/16 1800    Visit Number 2   Number of Visits 7   Date for PT Re-Evaluation 01/12/17   PT Start Time 1502   PT Stop Time 1545   PT Time Calculation (min) 43 min   Activity Tolerance Patient tolerated treatment well   Behavior During Therapy [facility] for tasks assessed/performed      Past Medical History:  Diagnosis Date  . Colon polyps   . Diverticulitis   . Hyperlipidemia   . Hypertension     Past Surgical History:  Procedure Laterality Date  . APPENDECTOMY    . COLONOSCOPY      There were no vitals filed for this visit.      Subjective Assessment - 12/09/16 1508    Subjective Doing the exercises on the floor.  bed too soft for the hip / quad stretches.    Currently in Pain? No/denies   Pain Orientation Right;Lower   Pain Descriptors / Indicators --  stiff.   Pain Type Chronic pain   Pain Onset More than a month ago   Pain Frequency Constant   Aggravating Factors  twisting YOGA,  bending   Pain Relieving Factors medication   Multiple Pain Sites No                         [facility] Adult PT Treatment/Exercise - 12/09/16 0001      Lumbar Exercises: Stretches   Passive Hamstring Stretch 3 reps;30 seconds   Passive Hamstring Stretch Limitations each   Lower Trunk Rotation 5 reps   Lower Trunk Rotation Limitations 10 seconds   Quad Stretch 3 reps;30 seconds   Quad Stretch Limitations each with manual to quads  both   Piriformis Stretch 3 reps;30 seconds   Piriformis Stretch Limitations figure4 X 1,  knee toward opposit shoulder X 2, 30 seconds RT and LT     Lumbar Exercises: Supine   Other Supine  Lumbar Exercises decompression with 2 pillows:  leg lengthener Right and leg press each 10 x 5 seconds.       Manual Therapy   Manual therapy comments instrument assist  in sidelying,  myofascial and soft tissue softened.                    PT Short Term Goals - 12/09/16 1804      PT SHORT TERM GOAL #1   Title pt will be independent with inital HEP (12/22/16)   Baseline cues needed   Time 3   Period Weeks   Status On-going     PT SHORT TERM GOAL #2   Title pt will improve bil and hip extension and abduction strength to >/= 4+/5 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 (01/12/17)   Time 6   Period Weeks   Status New     PT LONG TERM GOAL #3   Title pt will improve FOTO to </= to 24% limited in order to show functional improvement. (01/12/17)   Time 6   Period Weeks   Status New               Plan - 12/09/16 1801    Clinical Impression Statement Stretching the focus of today's session. Manual seemed to be helpful.  No new goals met.  patient able to relax his legs with increased knee/hip extension post session.   PT Next Visit Plan , QL stretching, hip flexor stretching, PA lumbar mobs, hip strengthening  consider gastroc stretch,  wall slides facing wall with weight  shifts.  ADL handout. Check leg length??   PT Home Exercise Plan lower trunk rotation, childs pose, hip flexor stretch   Consulted and Agree with Plan of Care Patient      Patient will benefit from skilled therapeutic intervention in order to improve the following deficits and impairments:  Decreased endurance, Decreased range of motion, Decreased activity tolerance, Pain, Hypomobility, Improper body mechanics, Decreased mobility, Decreased strength, Postural dysfunction  Visit Diagnosis: Chronic right-sided low back pain without sciatica  Decreased mobility  Muscle spasm of back     Problem List Patient Active Problem List   Diagnosis Date Noted  . Lumbar facet arthropathy 11/20/2016  . Lumbar degenerative disc disease 11/20/2016  . Chronic right-sided low back pain without sciatica 11/20/2016  . Reflex sympathetic dystrophy 10/29/2016  . Insomnia 03/14/2014  . Knee pain - sees ortho 03/14/2014  . Hyperlipemia 08/31/2007  . Essential hypertension 08/31/2007    [name]  PTA 12/09/2016, 6:05 PM  [facility] [address] [place], [place], [address] Phone: [phone]   Fax:  [phone]  Name: Clinton Newton MRN: [id number] Date of Birth: February 04, 1951

## 2016-12-11 ENCOUNTER — Encounter: Payer: Self-pay | Admitting: Family Medicine

## 2016-12-12 ENCOUNTER — Encounter: Payer: Self-pay | Admitting: Family Medicine

## 2016-12-14 ENCOUNTER — Encounter: Payer: Self-pay | Admitting: Gastroenterology

## 2016-12-14 ENCOUNTER — Telehealth: Payer: Self-pay | Admitting: Gastroenterology

## 2016-12-14 ENCOUNTER — Ambulatory Visit: Payer: BLUE CROSS/BLUE SHIELD | Admitting: Physical Therapy

## 2016-12-14 DIAGNOSIS — M545 Low back pain, unspecified: Secondary | ICD-10-CM

## 2016-12-14 DIAGNOSIS — G8929 Other chronic pain: Secondary | ICD-10-CM

## 2016-12-14 DIAGNOSIS — R2689 Other abnormalities of gait and mobility: Secondary | ICD-10-CM

## 2016-12-14 DIAGNOSIS — M62830 Muscle spasm of back: Secondary | ICD-10-CM

## 2016-12-14 NOTE — Telephone Encounter (Signed)
Patient has been placed on the schedule and is aware of the appt

## 2016-12-14 NOTE — Patient Instructions (Signed)

## 2016-12-14 NOTE — Therapy (Signed)
[place], [place], [address] Phone: [phone]   Fax:  [phone]  Physical Therapy Treatment  Patient Details  Name: Clinton Newton MRN: [id number] Date of Birth: [birth year] Referring Provider: Alysia Penna MD  Encounter Date: 12/14/2016      PT End of Session - 12/14/16 1824    Visit Number 3   Number of Visits 7   Date for PT Re-Evaluation 01/12/17   PT Start Time [id number]   PT Stop Time 1500   PT Time Calculation (min) 48 min   Activity Tolerance Patient tolerated treatment well   Behavior During Therapy [facility] for tasks assessed/performed      Past Medical History:  Diagnosis Date  . Colon polyps   . Diverticulitis   . Hyperlipidemia   . Hypertension     Past Surgical History:  Procedure Laterality Date  . APPENDECTOMY    . COLONOSCOPY      There were no vitals filed for this visit.      Subjective Assessment - 12/14/16 1410    Subjective Feels a about the same as last week.   has been doing the exercises.   has been doing the trigger point work with knuckles often.     Currently in Pain? No/denies   Pain Score 1    Pain Location Back   Pain Orientation Right;Lower   Pain Descriptors / Indicators --  muscles in right low back are never relaxed.    Pain Type Chronic pain   Pain Frequency Constant   Aggravating Factors  twisating.  reaching in the back seat,  unABLE TO PLAY GOLF,  Yoga   Pain Relieving Factors muscle relaxers             [facility] PT Assessment - 12/14/16 0001      Posture/Postural Control   Posture/Postural Control Postural limitations   Posture Comments lateral shift     Strength   Right Hip ABduction 4+/5   Left Hip ABduction 4+/5                     [facility] Adult PT Treatment/Exercise - 12/14/16 0001      Self-Care   Self-Care ADL's;Lifting  trial heel lift  shoe to relive posture strain in low back   ADL's handout issued,  housekeeping  activities demonstrated to patient,  modifications suggested.     Lifting demonstrated how to lift, how to modify a lift as needed.      Knee/Hip Exercises: Machines for Strengthening   Hip Cybex 3 plates 10 x each Abduction/  extension both legs.      Knee/Hip Exercises: Sidelying   Hip ABduction 5 reps  3 plates v  standing hip cybex,  each     Knee/Hip Exercises: Prone   Hip Extension 5 reps     Manual Therapy   Manual therapy comments brief instrument assist soft tissue work rt low back                PT Education - 12/14/16 1823    Education provided Yes   Education Details ADL education,  how to use hip cybex at the gym   Person(s) Educated Patient   Methods Explanation;Tactile cues;Verbal cues;Handout   Comprehension Verbalized understanding;Returned demonstration          PT Short Term Goals - 12/14/16 1826      PT SHORT TERM GOAL #1   Title pt will be independent with  inital HEP (12/22/16)   Time 3   Period Weeks   Status On-going     PT SHORT TERM GOAL #2   Title pt will improve bil and hip extension and abduction strength to >/= 4+/5 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  has not demonstrated techniques   Time 3   Period Weeks   Status On-going           PT Long Term Goals - 12/01/16 1753      PT LONG TERM GOAL #1   Title pt will be indenpendent with all HEP given as of last session (01/12/17)   Time 6   Period Weeks   Status New     PT LONG TERM GOAL #2   Title pt will increase lumbar extension by >/= 8 degrees with </= 1/10 pain in order to paint for >/= 2  hours. (01/12/17)   Time 6   Period Weeks   Status New     PT LONG TERM GOAL #3   Title pt will improve FOTO to </= to 24% limited in order to show functional improvement. (01/12/17)   Time 6   Period Weeks   Status New               Plan - 12/14/16 1824    Clinical Impression Statement Education focus of today for body mechanics and posture ed.  trial of heel wedge today was positive for posture correction and may help pain.     PT Next Visit Plan chech heel lift,   helpful?  Answer any postiure abd body mechanics questions.  Practice lifting?   PT Home Exercise Plan lower trunk rotation, childs pose, hip flexor stretch   Consulted and Agree with Plan of Care Patient      Patient will benefit from skilled therapeutic intervention in order to improve the following deficits and impairments:  Decreased endurance, Decreased range of motion, Decreased activity tolerance, Pain, Hypomobility, Improper body mechanics, Decreased mobility, Decreased strength, Postural dysfunction  Visit Diagnosis: Chronic right-sided low back pain without sciatica  Decreased mobility  Muscle spasm of back     Problem List Patient Active Problem List   Diagnosis Date Noted  . Lumbar facet arthropathy 11/20/2016  . Lumbar degenerative disc disease 11/20/2016  . Chronic right-sided low back pain without sciatica 11/20/2016  . Reflex sympathetic dystrophy 10/29/2016  . Insomnia 03/14/2014  . Knee pain - sees ortho 03/14/2014  . Hyperlipemia 08/31/2007  . Essential hypertension 08/31/2007    Landry Lookingbill PTA 12/14/2016, 6:28 PM  [facility] [address] [place], [place], [address] Phone: [phone]   Fax:  [phone]  Name: Clinton Newton MRN: [id number] Date of Birth: 21-Jan-1951

## 2016-12-14 NOTE — Telephone Encounter (Signed)
Left message for patient to call back  

## 2016-12-14 NOTE — Telephone Encounter (Signed)
Patient has not been seen in the office in 2/1/2 years and needs office visit for evaluation.  He is advised that we could see him on 12/16/16 at 3:00 with [name] RNP but he has another appt at physical therapy.  He is going to call and move PT appt and I will place him in the appt spot for [facility] RNP.

## 2016-12-15 ENCOUNTER — Other Ambulatory Visit: Payer: Self-pay | Admitting: *Deleted

## 2016-12-15 MED ORDER — ZALEPLON 5 MG PO CAPS: 5.0000 mg | ORAL_CAPSULE | Freq: Every evening | ORAL | 0 refills | Status: DC | PRN

## 2016-12-15 NOTE — Telephone Encounter (Signed)
Rx done and pt was informed via Mychart message. 

## 2016-12-16 ENCOUNTER — Ambulatory Visit (INDEPENDENT_AMBULATORY_CARE_PROVIDER_SITE_OTHER): Payer: BLUE CROSS/BLUE SHIELD | Admitting: Nurse Practitioner

## 2016-12-16 ENCOUNTER — Encounter: Payer: Self-pay | Admitting: Nurse Practitioner

## 2016-12-16 ENCOUNTER — Ambulatory Visit: Payer: BLUE CROSS/BLUE SHIELD | Admitting: Physical Therapy

## 2016-12-16 VITALS — BP 126/80 | HR 88 | Ht 67.75 in | Wt 185.5 lb

## 2016-12-16 DIAGNOSIS — K5732 Diverticulitis of large intestine without perforation or abscess without bleeding: Secondary | ICD-10-CM | POA: Diagnosis not present

## 2016-12-16 MED ORDER — METRONIDAZOLE 500 MG PO TABS: 500.0000 mg | ORAL_TABLET | Freq: Three times a day (TID) | ORAL | 1 refills | Status: DC

## 2016-12-16 MED ORDER — CIPROFLOXACIN HCL 500 MG PO TABS: 500.0000 mg | ORAL_TABLET | Freq: Two times a day (BID) | ORAL | 1 refills | Status: DC

## 2016-12-16 NOTE — Progress Notes (Signed)
     HPI: Patient is a 66 year old male known to Dr. Fuller Plan. I saw him in 2015 for diverticulitis found on CT scan done by urology. Symptoms resolved and patient did fine until just a few days ago when he gradually developed LLQ pain again. No fevers. No bowel changes. No urinary symptoms. Pain is constant ache, not severe at this point and unrelated to meals or defecation. He is leaving [place] for a month next week, he is concerned about leaving town with diverticulitis.   Past Medical History:  Diagnosis Date  . Arthritis    L3-L4  . Colon polyps   . Diverticulitis   . Hyperlipidemia   . Hypertension     Patient's surgical history, family medical history, social history, medications and allergies were all reviewed in Epic    Physical Exam: BP 126/80 (BP Location: Left Arm, Patient Position: Sitting, Cuff Size: Normal)   Pulse 88   Ht 5' 7.75" (1.721 m) Comment: height measured without shoes  Wt 185 lb 8 oz (84.1 kg)   BMI 28.41 kg/m   GENERAL: Well developed white male in NAD PSYCH: :Pleasant, cooperative, normal affect EENT: Pupils equal, conjunctiva pink, mucous membranes moist, neck supple without masses CARDIAC:  RRR, no murmur heard, no peripheral edema PULM: Normal respiratory effort, lungs CTA bilaterally, no wheezing ABDOMEN:  soft,  nondistended, mild LLQ tenderness, no obvious masses, no hepatomegaly,  normal bowel sounds SKIN:  turgor, no lesions seen Musculoskeletal:  Normal muscle tone, normal  strength NEURO: Alert and oriented x 3, no focal neurologic deficits   ASSESSMENT and PLAN:  49. 66 year old male here with left lower quadrant pain, likely recurrent diverticulitis, second episode since 2015. The initial episode was found on CT scan done by Urology which showed distal descending/proximal sigmoid diverticulitis  -Patient has been following a high fiber diet since his first episode of diverticulitis. For now I've recommended a low fiber diet until  abdominal pain subsides. -Rx 10 days of Cipro and Flagyl. Patient is leaving town next Thursday for one month. He will call after completion of antibiotics if pain has not completely resolved. Additionally, I will give him a prescription for Cipro and Flagyl to keep on hand while out-of-town.  2. History of adenomatous colon polyps, surveillance colonoscopy due around April. Patient knows to contact our office if he has another episode of diverticulitis between now and then as that could necessitate need to reschedule the procedure  [name] , NP 12/16/2016, 3:05 PM

## 2016-12-16 NOTE — Patient Instructions (Signed)
We have sent the following medications to your pharmacy for you to pick up at your convenience: Cipro 500 mg twice daily (with 1 refill) Flagyl 500 mg three times daily (with 1 refill)  If your pain is not completely resolved after antibiotics, please call our office.  We have given you a low fiber diet to look over and follow until your abdominal pain has resolved.  If you are age 65 or older, your body mass index should be between 23-30. Your Body mass index is 28.41 kg/m. If this is out of the aforementioned range listed, please consider follow up with your Primary Care Provider.  If you are age 47 or younger, your body mass index should be between 19-25. Your Body mass index is 28.41 kg/m. If this is out of the aformentioned range listed, please consider follow up with your Primary Care Provider.

## 2016-12-17 NOTE — Progress Notes (Signed)
Reviewed and agree with initial management plan.  Malcolm T. Stark, MD FACG 

## 2016-12-21 ENCOUNTER — Ambulatory Visit: Payer: BLUE CROSS/BLUE SHIELD | Admitting: Physical Medicine & Rehabilitation

## 2016-12-22 ENCOUNTER — Encounter: Payer: Self-pay | Admitting: Physical Medicine & Rehabilitation

## 2016-12-22 ENCOUNTER — Ambulatory Visit (HOSPITAL_BASED_OUTPATIENT_CLINIC_OR_DEPARTMENT_OTHER): Payer: BLUE CROSS/BLUE SHIELD | Admitting: Physical Medicine & Rehabilitation

## 2016-12-22 ENCOUNTER — Other Ambulatory Visit: Payer: Self-pay

## 2016-12-22 ENCOUNTER — Encounter: Payer: BLUE CROSS/BLUE SHIELD | Attending: Physical Medicine & Rehabilitation

## 2016-12-22 VITALS — BP 129/86 | HR 65

## 2016-12-22 DIAGNOSIS — G8929 Other chronic pain: Secondary | ICD-10-CM | POA: Insufficient documentation

## 2016-12-22 DIAGNOSIS — M5136 Other intervertebral disc degeneration, lumbar region: Secondary | ICD-10-CM | POA: Diagnosis not present

## 2016-12-22 DIAGNOSIS — M7989 Other specified soft tissue disorders: Secondary | ICD-10-CM | POA: Insufficient documentation

## 2016-12-22 DIAGNOSIS — M1288 Other specific arthropathies, not elsewhere classified, other specified site: Secondary | ICD-10-CM

## 2016-12-22 DIAGNOSIS — M545 Low back pain: Secondary | ICD-10-CM | POA: Insufficient documentation

## 2016-12-22 DIAGNOSIS — M47816 Spondylosis without myelopathy or radiculopathy, lumbar region: Secondary | ICD-10-CM

## 2016-12-22 MED ORDER — METHOCARBAMOL 500 MG PO TABS: 500.0000 mg | ORAL_TABLET | Freq: Two times a day (BID) | ORAL | 0 refills | Status: DC | PRN

## 2016-12-22 NOTE — Patient Instructions (Signed)
Please call for appt Please finish out PT

## 2016-12-22 NOTE — Progress Notes (Signed)
Subjective:    Patient ID: Zixuan Macklin, male    DOB: Jan 08, 1951, 66 y.o.   MRN: [id number]  HPI   Chronic    Subjective:    Patient ID: Averey Marold, male    DOB: 11-Jun-1951, 66 y.o.   MRN: [id number]  HPI CC:  Low back pain  Has had intermittent episodes of intense low back which lasts for seconds.  Usually occurs with bending .  4 episodes over the last year.Concerned about this occurring while he is at a body painting convention  Also has Right lower back pain that is fairly constant , But only 1/10   Takes methocarbamol ~1 time a week, takes in evening-Out of these  Epidural helped for 1-2 days  Evaluated by orthopedic spine surgeon who reportedly advised lumbar surgery, do not have those notes available to me for review  Works out about 5 x per week, pool , water aerobics, floor work  Pt has been to PT 3 times stretching quads more frequently  Pain Inventory Average Pain 2 Pain Right Now 1 My pain is sharp and dull  In the last 24 hours, has pain interfered with the following? General activity 0 Relation with others 0 Enjoyment of life 1 What TIME of day is your pain at its worst? daytime Sleep (in general) Good  Pain is worse with: bending and standing Pain improves with: medication Relief from Meds: 8  Mobility walk without assistance do you drive?  yes  Function not employed: date last employed 2015  Neuro/Psych No problems in this area  Prior Studies Any changes since last visit?  no  Physicians involved in your care Any changes since last visit?  no   Family History  Problem Relation Age of Onset  . Heart disease Father   . Colon cancer Neg Hx   . Pancreatic cancer Neg Hx   . Rectal cancer Neg Hx   . Stomach cancer Neg Hx    Social History   Social History  . Marital status: Married    Spouse name: N/A  . Number of children: N/A  . Years of education: N/A   Occupational History  . Retired    Social History Main Topics    . Smoking status: Never Smoker  . Smokeless tobacco: Never Used  . Alcohol use 4.2 oz/week    7 Glasses of wine per week  . Drug use: No  . Sexual activity: Yes   Other Topics Concern  . 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 APPENDECTOMY    . COLONOSCOPY     Past Medical History:  Diagnosis Date  . Arthritis    L3-L4  . Colon polyps   . Diverticulitis   . Hyperlipidemia   . Hypertension    BP 129/86   Pulse 65   SpO2 97%   Opioid Risk Score:   Fall Risk Score:  `1  Depression screen PHQ 2/9  Depression screen [facility] 2/9 11/20/2016 10/29/2016  Decreased Interest 0 0  Down, Depressed, Hopeless 0 0  PHQ - 2 Score 0  0  Altered sleeping 0 -  Tired, decreased energy 0 -  Change in appetite 0 -  Feeling bad or failure about yourself  0 -  Trouble concentrating 0 -  Moving slowly or fidgety/restless 0 -  Suicidal thoughts 0 -  PHQ-9 Score 0 -  Difficult doing work/chores Not difficult at all -   Review of Systems  Constitutional: Negative.   HENT: Negative.   Eyes: Negative.   Respiratory: Negative.   Cardiovascular: Negative.   Gastrointestinal: Negative.   Endocrine: Negative.   Genitourinary: Negative.   Musculoskeletal: Positive for back pain.  Skin: Negative.   Allergic/Immunologic: Negative.   Neurological: Negative.   Hematological: Negative.   Psychiatric/Behavioral: Negative.   All other systems reviewed and are negative.      Objective:   Physical Exam  Constitutional: He is oriented to person, place, and time. He appears well-developed and well-nourished.  HENT:  Head: Normocephalic and atraumatic.  Eyes: Conjunctivae and EOM are normal. Pupils  are equal, round, and reactive to light.  Neck: Normal range of motion. Neck supple.  Cardiovascular: Normal rate and regular rhythm.   Pulmonary/Chest: Effort normal and breath sounds normal. No respiratory distress.  Abdominal: Soft. Bowel sounds are normal. He exhibits no distension.  Musculoskeletal: He exhibits edema.       Lumbar back: He exhibits decreased range of motion. He exhibits no deformity and no spasm.       Right foot: There is decreased range of motion. There is no tenderness.  Right foot and ankle are warm. No pain with range of motion. No tenderness. Palpation does have 2 plus pitting edema, mild reddish discoloration, no hyperhidrosis or hyperhidrosis. No hypersensitivity to touch.  There is pain in the lumbar area around L4 with right lateral leaning as well as lumbar extension. Patient has normal lumbar flexion, but lumbar extension is limited to 25%  Neurological: He is alert and oriented to person, place, and time. He displays no atrophy. No sensory deficit. He exhibits normal muscle tone. Coordination and gait normal.  Normal sensation to pinprick in bilateral upper and lower limbs  No evidence of toe drag or knee instability  Skin: Skin is warm and dry.  Psychiatric: He has a normal mood and affect. His behavior is normal. Judgment and thought content normal.  Nursing note and vitals reviewed.         Assessment & Plan:    2. Right ankle swelling but no significant pain. Some improvement after Medrol Dosepak   Pain Inventory Average Pain 1 Pain Right Now 0 My pain is intermittent and aching  In the last 24 hours, has pain interfered with the following? General activity 1 Relation with others 1 Enjoyment of life 1 What TIME of day is your pain at its worst? daytime Sleep (in general) Good  Pain is worse with: bending Pain improves with: medication Relief from Meds: 1  Mobility walk without assistance ability to climb steps?  yes do you  drive?  yes  Function retired  Neuro/Psych No problems in this area  Prior Studies Any changes since last visit?  no  Physicians involved in your care Any changes since last visit?  no   Family History  Problem Relation Age of Onset  . Heart disease Father   . Colon cancer Neg Hx   . Pancreatic cancer Neg Hx   . Rectal cancer Neg Hx   . Stomach cancer Neg Hx    Social History   Social  History  . Marital status: Married    Spouse name: N/A  . Number of children: N/A  . Years of education: N/A   Occupational History  . Retired    Social History Main Topics  . Smoking status: Never Smoker  . Smokeless tobacco: Never Used  . Alcohol use 4.2 oz/week    7 Glasses of wine per week  . Drug use: No  . Sexual activity: Yes   Other Topics Concern  . 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 APPENDECTOMY    . COLONOSCOPY     Past Medical History:  Diagnosis Date  . Arthritis    L3-L4  . Colon polyps   . Diverticulitis   . Hyperlipidemia   . Hypertension    There were no vitals taken for this visit.  Opioid Risk Score:   Fall Risk Score:  `1  Depression screen PHQ 2/9  Depression screen [facility] 2/9 11/20/2016 10/29/2016  Decreased Interest 0 0  Down, Depressed, Hopeless 0 0  PHQ - 2 Score 0 0  Altered sleeping 0 -  Tired, decreased energy 0 -  Change in appetite 0 -  Feeling bad or failure about yourself  0 -  Trouble concentrating 0 -  Moving slowly or fidgety/restless 0 -  Suicidal thoughts 0 -  PHQ-9 Score 0 -  Difficult doing work/chores Not difficult at all -    Review of Systems  Constitutional: Negative.   HENT: Negative.   Eyes: Negative.    Respiratory: Negative.   Cardiovascular: Negative.   Gastrointestinal: Positive for abdominal pain.  Endocrine: Negative.   Genitourinary: Negative.   Musculoskeletal: Negative.   Allergic/Immunologic: Negative.   Neurological: Negative.   Hematological: Negative.   Psychiatric/Behavioral: Negative.   All other systems reviewed and are negative.      Objective:   Physical Exam  Constitutional: He is oriented to person, place, and time. He appears well-developed and well-nourished.  HENT:  Head: Normocephalic and atraumatic.  Eyes: Conjunctivae and EOM are normal. Pupils are equal, round, and reactive to light.  Neck: Normal range of motion.  Musculoskeletal:       Lumbar back: He exhibits decreased range of motion. He exhibits no tenderness, no deformity and no spasm.  Negative straight leg raising  Neurological: He is alert and oriented to person, place, and time.  Motor strength is 5/5 bilateral hip flexor, knee extensor, ankle dorsiflexor  Psychiatric: He has a normal mood and affect.  Nursing note and vitals reviewed.         Assessment & Plan:  1. Lumbar spondylosis without radiculopathy or myelopathy. Overall doing well with minimal pain. He is learning some additional stretching and core strengthening exercises through physical therapy. Patient does a fairly robust exercise program on his own.  We'll complete his outpatient therapy program. Keep up with home exercise program. If he does have some worsening of his pain,severe flare, I would be happy to see him again and potentially perform some medial branch blocks.  We'll follow up on when necessary basis

## 2016-12-23 ENCOUNTER — Ambulatory Visit: Payer: BLUE CROSS/BLUE SHIELD | Admitting: Physical Therapy

## 2016-12-23 DIAGNOSIS — M545 Low back pain, unspecified: Secondary | ICD-10-CM

## 2016-12-23 DIAGNOSIS — G8929 Other chronic pain: Secondary | ICD-10-CM

## 2016-12-23 DIAGNOSIS — R2689 Other abnormalities of gait and mobility: Secondary | ICD-10-CM

## 2016-12-23 DIAGNOSIS — M62830 Muscle spasm of back: Secondary | ICD-10-CM

## 2016-12-23 NOTE — Therapy (Addendum)
[place], [place], [address] Phone: [phone]   Fax:  [phone]  Physical Therapy Treatment / Discharge summary  Patient Details  Name: Clinton Newton MRN: [id number] Date of Birth: 03-23-1951 Referring Provider: Alysia Penna MD  Encounter Date: 12/23/2016      PT End of Session - 12/23/16 1737    Visit Number 4   Number of Visits 7   Date for PT Re-Evaluation 01/12/17   PT Start Time 1504   PT Stop Time 1545   PT Time Calculation (min) 41 min   Activity Tolerance Patient tolerated treatment well   Behavior During Therapy [facility] for tasks assessed/performed      Past Medical History:  Diagnosis Date  . Arthritis    L3-L4  . Colon polyps   . Diverticulitis   . Hyperlipidemia   . Hypertension     Past Surgical History:  Procedure Laterality Date  . APPENDECTOMY    . COLONOSCOPY      There were no vitals filed for this visit.      Subjective Assessment - 12/23/16 1724    Subjective I am feeling a little better.  I have been exercising my hips in the gym.   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  I feel happy and healthy. Pressure is always there.     Currently in Pain? Yes   Pain Location Back   Pain Orientation Right;Lower   Pain Descriptors / Indicators --  pressure   Pain Frequency Constant   Aggravating Factors  twisting,  getting up from squatting,  supportive posture   Pain Relieving Factors muscle relaxers                         [facility] Adult PT Treatment/Exercise - 12/23/16 0001      Lumbar Exercises: Stretches   Passive Hamstring Stretch 3 reps;30 seconds   Passive Hamstring Stretch Limitations cues for technique   Single Knee to Chest Stretch 1 rep;20 seconds  each   Piriformis Stretch 1 rep;30 seconds     Lumbar Exercises: Standing   Other Standing Lumbar Exercises Mini squat with longer pole for posture/ form for lifting.      Other Standing Lumbar Exercises Hip hinge 5 x reaching kettle bellsbehinf ankle,  good technique used.      Knee/Hip Exercises: Stretches   Other Knee/Hip Stretches Hip ER/ IR 20 seconmds X1 right     Knee/Hip Exercises: Machines for Strengthening   Cybex Leg Press 1,2 plates 10 x each   Hip Cybex doing at gym     Manual Therapy   Manual therapy comments manual soft tissue work to low back ,  Quadratus strumming,  and passive stretching                PT Education - 12/23/16 1736    Education provided Yes   Education Details Loifting technique,  sit to stand technique   Person(s) Educated Patient   Methods Explanation;Tactile cues;Verbal cues   Comprehension Verbalized understanding;Returned demonstration          PT Short Term Goals - 12/23/16 1741      PT SHORT TERM GOAL #1   Title pt will be independent with inital HEP (12/22/16)   Baseline independent   Time 3   Status On-going     PT SHORT TERM GOAL #2   Title pt will improve bil and  hip extension and abduction strength to >/= 4+/5 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   Time 3   Period Weeks   Status Achieved           PT Long Term Goals - 12/01/16 1753      PT LONG TERM GOAL #1   Title pt will be indenpendent with all HEP given as of last session (01/12/17)   Time 6   Period Weeks   Status New     PT LONG TERM GOAL #2   Title pt will increase lumbar extension by >/= 8 degrees with </= 1/10 pain in order to paint for >/= 2 hours. (01/12/17)   Time 6   Period Weeks   Status New     PT LONG TERM GOAL #3    Title pt will improve FOTO to </= to 24% limited in order to show functional improvement. (01/12/17)   Time 6   Period Weeks   Status New               Plan - 12/23/16 1737    Clinical Impression Statement Minor cues needed for lifting technique today. He was able to demonstrate correct technique after cues. He is able to state several body mechanic type changes to help decrease his pain.  He saw Dr Hillard Danker yesterday and he does not have to go back any time soon.  Pressure continues however it is less. Knee to chest RPM improving. almost WNLSTG#1,#3 met.   PT Next Visit Plan See hoew comprtition went.  Chech hip strength.  Finalize HEP?   PT Home Exercise Plan lower trunk rotation, childs pose, hip flexor stretch   Consulted and Agree with Plan of Care Patient      Patient will benefit from skilled therapeutic intervention in order to improve the following deficits and impairments:  Decreased endurance, Decreased range of motion, Decreased activity tolerance, Pain, Hypomobility, Improper body mechanics, Decreased mobility, Decreased strength, Postural dysfunction  Visit Diagnosis: Chronic right-sided low back pain without sciatica  Decreased mobility  Muscle spasm of back     Problem List Patient Active Problem List   Diagnosis Date Noted  . Lumbar facet arthropathy 11/20/2016  . Lumbar degenerative disc disease 11/20/2016  . Chronic right-sided low back pain without sciatica 11/20/2016  . Reflex sympathetic dystrophy 10/29/2016  . Insomnia 03/14/2014  . Knee pain - sees ortho 03/14/2014  . Hyperlipemia 08/31/2007  . Essential hypertension 08/31/2007    HARRIS,KAREN PTA 12/23/2016, 5:45 PM  [facility] [address] [place], [place], [address] Phone: [phone]   Fax:  [phone]  Name: Clinton Newton MRN: [id number] Date of Birth: Dec 16, 1950      PHYSICAL THERAPY DISCHARGE SUMMARY  Visits from Start  of Care: 4  Current functional level related to goals / functional outcomes: See goals   Remaining deficits: Unknown    Education / Equipment: HEP, posture, theraband ,lifting/ carrying mechanics.   Plan: Patient agrees to discharge.  Patient goals were partially met. Patient is being discharged due to not returning  since the last visit.  ?????     Kristoffer Leamon PT, DPT, LAT, ATC  01/20/17  10:30 AM

## 2016-12-30 ENCOUNTER — Ambulatory Visit: Payer: BLUE CROSS/BLUE SHIELD | Admitting: Physical Therapy

## 2017-01-06 ENCOUNTER — Ambulatory Visit: Payer: BLUE CROSS/BLUE SHIELD | Admitting: Physical Therapy

## 2017-01-28 ENCOUNTER — Encounter: Payer: Self-pay | Admitting: Family Medicine

## 2017-01-28 ENCOUNTER — Ambulatory Visit (INDEPENDENT_AMBULATORY_CARE_PROVIDER_SITE_OTHER): Payer: BLUE CROSS/BLUE SHIELD | Admitting: Family Medicine

## 2017-01-28 ENCOUNTER — Encounter: Payer: Self-pay | Admitting: Nurse Practitioner

## 2017-01-28 VITALS — BP 126/80 | HR 68 | Temp 98.3°F | Wt 186.0 lb

## 2017-01-28 DIAGNOSIS — J069 Acute upper respiratory infection, unspecified: Secondary | ICD-10-CM

## 2017-01-28 NOTE — Progress Notes (Signed)
Patient ID: Raymundo Rout, male   DOB: 09-Apr-1951, 66 y.o.   MRN: [id number]  PCP: Lucretia Kern., DO  Subjective:  Clinton Newton is a 66 y.o. year old very pleasant male patient who presents with Upper Respiratory infection symptoms including nasal congestion, sinus pressure/pain, sore throat, cough that is nonproductive -started: 4 days ago, symptoms are not improving  -previous treatments: Benedryl, Coricidin, -sick contacts/travel/risks: denies flu exposure; recent sick contact exposure with wife   -Hx of: allergies Currently on Cipro and metronidazole for recent diverticulitis flare  ROS-denies fever, SOB, NVD, tooth pain  Pertinent Past Medical History- HTN, Hyperlipidemia  Medications- reviewed  Current Outpatient Prescriptions  Medication Sig Dispense Refill  . amitriptyline (ELAVIL) 25 MG tablet Take 1 tablet (25 mg total) by mouth at bedtime. 30 tablet 1  . amLODipine (NORVASC) 5 MG tablet Take 1 tablet (5 mg total) by mouth daily. 90 tablet 0  . aspirin 81 MG EC tablet Take 81 mg by mouth daily.      . bisoprolol (ZEBETA) 5 MG tablet Take 1 tablet (5 mg total) by mouth daily. 90 tablet 0  . cholecalciferol (VITAMIN D) 1000 UNITS tablet Take 1,000 Units by mouth daily.    . ciprofloxacin (CIPRO) 500 MG tablet Take 1 tablet (500 mg total) by mouth 2 (two) times daily. 20 tablet 1  . Flaxseed, Linseed, (FLAXSEED OIL PO) Take by mouth as directed.    Marland Kitchen MAGNESIUM PO Take by mouth daily.    . methocarbamol (ROBAXIN) 500 MG tablet Take 1 tablet (500 mg total) by mouth 2 (two) times daily as needed for muscle spasms. 30 tablet 0  . metroNIDAZOLE (FLAGYL) 500 MG tablet Take 1 tablet (500 mg total) by mouth 3 (three) times daily. 30 tablet 1  . Multiple Vitamin (MULTIVITAMIN) tablet Take 1 tablet by mouth daily.      . naproxen (NAPROSYN) 500 MG tablet Take 1 tablet (500 mg total) by mouth 2 (two) times daily with a meal. 30 tablet 0  . Omega-3 Fatty Acids (FISH OIL PO) Take by mouth  daily.    . Probiotic Product (PROBIOTIC PO) Take by mouth daily.    . rosuvastatin (CRESTOR) 20 MG tablet TAKE 1 BY MOUTH DAILY 90 tablet 0  . triamcinolone (NASACORT AQ) 55 MCG/ACT nasal inhaler 2 sprays by Nasal route daily as needed.      . zaleplon (SONATA) 5 MG capsule Take 1 capsule (5 mg total) by mouth at bedtime as needed for sleep. 10 capsule 0   No current facility-administered medications for this visit.     Objective: BP 126/80 (BP Location: Left Arm, Patient Position: Sitting, Cuff Size: Normal)   Pulse 68   Temp 98.3 F (36.8 C) (Oral)   Wt 186 lb (84.4 kg)   SpO2 96%   BMI 28.49 kg/m  Gen: NAD, resting comfortably HEENT: Turbinates erythematous, TM normal, pharynx mildly erythematous with no tonsilar exudate or edema, no sinus tenderness CV: RRR no murmurs rubs or gallops Lungs: CTAB no crackles, wheeze, rhonchi Abdomen: soft/nontender/nondistended/normal bowel sounds. No rebound or guarding.  Ext: no edema Skin: warm, dry, no rash Neuro: grossly normal, moves all extremities  Assessment/Plan:  Upper Respiratory infection History and exam today are suggestive of viral infection most likely due to upper respiratory infection. Symptomatic treatment with Tylenol and Mucinex DM.    We discussed that we did not find any infection that had higher probability of being bacterial such as pneumonia or strep throat. We discussed  signs that bacterial infection may have developed particularly fever or shortness of breath. Likely course of 1-2 weeks. Patient is contagious and advised good handwashing and consideration of mask If going to be in public places.   Finally, we reviewed reasons to return to care including if symptoms worsen or persist or new concerns arise- once again particularly shortness of breath or fever.  Laurita Quint, FNP

## 2017-01-28 NOTE — Progress Notes (Signed)
Pre visit review using our clinic review tool, if applicable. No additional management support is needed unless otherwise documented below in the visit note. 

## 2017-01-28 NOTE — Patient Instructions (Addendum)
It was a pleasure to see you today. Your symptoms today are most likely caused by viral illness. Please drink plenty of water enough for your urine to be pale yellow or clear. You may use Tylenol 325 mg every 6 hours as needed. Mucinex DM can be used for cough. Follow-up for evaluation if your symptoms do not improve in 3-4 days, worsen, or you develop a fever greater than 100.   Upper Respiratory Infection, Adult Most upper respiratory infections (URIs) are caused by a virus. A URI affects the nose, throat, and upper air passages. The most common type of URI is often called "the common cold." Follow these instructions at home:  Take medicines only as told by your doctor.  Gargle warm saltwater or take cough drops to comfort your throat as told by your doctor.  Use a warm mist humidifier or inhale steam from a shower to increase air moisture. This may make it easier to breathe.  Drink enough fluid to keep your pee (urine) clear or pale yellow.  Eat soups and other clear broths.  Have a healthy diet.  Rest as needed.  Go back to work when your fever is gone or your doctor says it is okay.  You may need to stay home longer to avoid giving your URI to others.  You can also wear a face mask and wash your hands often to prevent spread of the virus.  Use your inhaler more if you have asthma.  Do not use any tobacco products, including cigarettes, chewing tobacco, or electronic cigarettes. If you need help quitting, ask your doctor. Contact a doctor if:  You are getting worse, not better.  Your symptoms are not helped by medicine.  You have chills.  You are getting more short of breath.  You have brown or red mucus.  You have yellow or brown discharge from your nose.  You have pain in your face, especially when you bend forward.  You have a fever.  You have puffy (swollen) neck glands.  You have pain while swallowing.  You have white areas in the back of your throat. Get  help right away if:  You have very bad or constant:  Headache.  Ear pain.  Pain in your forehead, behind your eyes, and over your cheekbones (sinus pain).  Chest pain.  You have long-lasting (chronic) lung disease and any of the following:  Wheezing.  Long-lasting cough.  Coughing up blood.  A change in your usual mucus.  You have a stiff neck.  You have changes in your:  Vision.  Hearing.  Thinking.  Mood. This information is not intended to replace advice given to you by your health care provider. Make sure you discuss any questions you have with your health care provider. Document Released: 03/30/2008 Document Revised: 06/14/2016 Document Reviewed: 01/17/2014 Elsevier Interactive Patient Education  2017 [place] NOW OFFER   [facility] FAST TRACK!!!  SAME DAY Appointments for ACUTE CARE  Such as: Sprains, Injuries, cuts, abrasions, rashes, muscle pain, joint pain, back pain Colds, flu, sore throats, headache, allergies, cough, fever  Ear pain, sinus and eye infections Abdominal pain, nausea, vomiting, diarrhea, upset stomach Animal/insect bites  3 Easy Ways to Schedule: Walk-In Scheduling Call in scheduling Mychart Sign-up: [email or web address]

## 2017-02-05 ENCOUNTER — Ambulatory Visit (AMBULATORY_SURGERY_CENTER): Payer: Self-pay

## 2017-02-05 VITALS — Ht 68.0 in | Wt 182.0 lb

## 2017-02-05 DIAGNOSIS — Z860100 Personal history of colon polyps, unspecified: Secondary | ICD-10-CM

## 2017-02-05 MED ORDER — SUPREP BOWEL PREP KIT 17.5-3.13-1.6 GM/177ML PO SOLN: 1.0000 | Freq: Once | ORAL | 0 refills | Status: AC

## 2017-02-05 NOTE — Progress Notes (Signed)
No allergies to eggs or soy No diet meds No home oxygen No past problems with anesthesia  Registered emmi 

## 2017-02-08 ENCOUNTER — Encounter: Payer: Self-pay | Admitting: Gastroenterology

## 2017-02-09 ENCOUNTER — Encounter: Payer: Self-pay | Admitting: Family Medicine

## 2017-02-09 NOTE — Telephone Encounter (Signed)
I called the pt and informed him of the message below. 

## 2017-02-10 NOTE — Telephone Encounter (Signed)
Error

## 2017-02-11 ENCOUNTER — Encounter: Payer: Self-pay | Admitting: Nurse Practitioner

## 2017-02-15 ENCOUNTER — Encounter: Payer: Self-pay | Admitting: Nurse Practitioner

## 2017-02-19 ENCOUNTER — Encounter: Payer: BLUE CROSS/BLUE SHIELD | Admitting: Gastroenterology

## 2017-02-22 ENCOUNTER — Telehealth: Payer: Self-pay | Admitting: Gastroenterology

## 2017-02-22 NOTE — Telephone Encounter (Signed)
Patient is no longer scheduled for a Colonoscopy. Will not attempt PA for Suprep.

## 2017-03-02 ENCOUNTER — Telehealth: Payer: Self-pay | Admitting: Gastroenterology

## 2017-03-02 NOTE — Telephone Encounter (Signed)
Pt cancelled his 02-19-17 colon with no reschedule- called [facility] to inform them no need for prep at this time  Marijean Niemann

## 2017-03-09 ENCOUNTER — Encounter: Payer: Self-pay | Admitting: Sports Medicine

## 2017-03-09 ENCOUNTER — Ambulatory Visit (INDEPENDENT_AMBULATORY_CARE_PROVIDER_SITE_OTHER): Payer: BLUE CROSS/BLUE SHIELD | Admitting: Sports Medicine

## 2017-03-09 DIAGNOSIS — G9050 Complex regional pain syndrome I, unspecified: Secondary | ICD-10-CM

## 2017-03-09 NOTE — Assessment & Plan Note (Signed)
Patient presenting with improvement in symptoms and swelling, however notes continued mild swelling and occasional mild discomfort which is troublesome. Discussed typically course for RSD. Patient to continue compression as needed for swelling. Also discussed possibility of starting vitamin C or B6. Will hold off on re-filling amitriptyline. Patient to f/u as needed.

## 2017-03-09 NOTE — Progress Notes (Signed)
Subjective: CC: f/u ankle  HPI: Patient is a 66 y.o. male  presenting to clinic today for g/u on R ankle sympathteic dystrophy after R ankle sprain.   - He was last seen on 10/29/16 for this - prescribed amitriptyline 25mg  qhs > not currently taking anymore; felt it was helpful initially but wasn't sure if he had any refills. - recommended a compression sleeve > he used it for a while and felt it was beneficial for reducing the swelling but not using anymore.   - was doing home exercises for ankle, now doing pool exercises.  - still having mild tenderness on the lateral aspect after prolonged exercise/walking.  - pain is 2/10.  - more frustrated that swelling has not resolved; it may worsen after a busy day.  - he got prednisone for back pain that helped with ankle pain temporarily   Social History: non smoker     ROS: All other systems reviewed and are negative. No swelling of other joints; Denies foot or ankle numbness  Past Medical History Patient Active Problem List   Diagnosis Date Noted  . Lumbar facet arthropathy ([place]) 11/20/2016  . Lumbar degenerative disc disease 11/20/2016  . Chronic right-sided low back pain without sciatica 11/20/2016  . Reflex sympathetic dystrophy 10/29/2016  . Insomnia 03/14/2014  . Knee pain - sees ortho 03/14/2014  . Hyperlipemia 08/31/2007  . Essential hypertension 08/31/2007    Medications- reviewed and updated Current Outpatient Prescriptions  Medication Sig Dispense Refill  . amitriptyline (ELAVIL) 25 MG tablet Take 1 tablet (25 mg total) by mouth at bedtime. 30 tablet 1  . amLODipine (NORVASC) 5 MG tablet Take 1 tablet (5 mg total) by mouth daily. 90 tablet 0  . aspirin 81 MG EC tablet Take 81 mg by mouth daily.      . bisoprolol (ZEBETA) 5 MG tablet Take 1 tablet (5 mg total) by mouth daily. 90 tablet 0  . cholecalciferol (VITAMIN D) 1000 UNITS tablet Take 1,000 Units by mouth daily.    . ciprofloxacin (CIPRO) 500 MG tablet Take 1  tablet (500 mg total) by mouth 2 (two) times daily. (Patient not taking: Reported on 02/05/2017) 20 tablet 1  . Flaxseed, Linseed, (FLAXSEED OIL PO) Take by mouth as directed.    Marland Kitchen MAGNESIUM PO Take by mouth daily.    . methocarbamol (ROBAXIN) 500 MG tablet Take 1 tablet (500 mg total) by mouth 2 (two) times daily as needed for muscle spasms. (Patient not taking: Reported on 02/05/2017) 30 tablet 0  . metroNIDAZOLE (FLAGYL) 500 MG tablet Take 1 tablet (500 mg total) by mouth 3 (three) times daily. 30 tablet 1  . Multiple Vitamin (MULTIVITAMIN) tablet Take 1 tablet by mouth daily.      . naproxen (NAPROSYN) 500 MG tablet Take 1 tablet (500 mg total) by mouth 2 (two) times daily with a meal. 30 tablet 0  . Omega-3 Fatty Acids (FISH OIL PO) Take by mouth daily.    . Probiotic Product (PROBIOTIC PO) Take by mouth daily.    . rosuvastatin (CRESTOR) 20 MG tablet TAKE 1 BY MOUTH DAILY 90 tablet 0  . triamcinolone (NASACORT AQ) 55 MCG/ACT nasal inhaler 2 sprays by Nasal route daily as needed.      . zaleplon (SONATA) 5 MG capsule Take 1 capsule (5 mg total) by mouth at bedtime as needed for sleep. 10 capsule 0   No current facility-administered medications for this visit.     Objective: Office vital signs  reviewed. BP (!) 161/88   Ht 5\' 10"  (1.778 m)   Wt 180 lb (81.6 kg)   BMI 25.83 kg/m    Physical Examination:  General: Awake, alert, well- nourished, NAD  R ankle: Mild diffuse swelling compared to the L. No erythema or ecchymoses noted.  Mildly warm without tenderness to palpation. Normal ROM with dorsiflexion, plantar flexion, inversion, and eversion Solid ligament endpoints. No pain with talar tilt. Neurovascularly intact distally.    Assessment/Plan: Reflex sympathetic dystrophy Patient presenting with improvement in symptoms and swelling, however notes continued mild swelling and occasional mild discomfort which is troublesome. Discussed typically course for RSD. Patient to  continue compression as needed for swelling. Also discussed possibility of starting vitamin C or B6. Will hold off on re-filling amitriptyline. Patient to f/u as needed.    No orders of the defined types were placed in this encounter.   No orders of the defined types were placed in this encounter.   Archie Patten PGY-3, [facility]  I observed and examined the patient with the resident and agree with assessment and plan.  Note reviewed and modified by me. Stefanie Libel, MD

## 2017-03-09 NOTE — Patient Instructions (Addendum)
Complex regional pain syndrome typically takes around 1 year to resolve.  Continue the compression brace as needed.  You can try supplementing with high dose vitamin C (1000mg ) daily and/or vitamin B6.

## 2017-03-10 ENCOUNTER — Other Ambulatory Visit: Payer: Self-pay | Admitting: Family Medicine

## 2017-03-11 MED ORDER — ROSUVASTATIN CALCIUM 20 MG PO TABS: ORAL_TABLET | ORAL | 0 refills | Status: DC

## 2017-03-11 NOTE — Telephone Encounter (Signed)
Rx done. 

## 2017-04-01 ENCOUNTER — Other Ambulatory Visit: Payer: Self-pay | Admitting: Physical Medicine & Rehabilitation

## 2017-04-02 MED ORDER — METHOCARBAMOL 500 MG PO TABS: 500.0000 mg | ORAL_TABLET | Freq: Two times a day (BID) | ORAL | 0 refills | Status: DC | PRN

## 2017-04-02 NOTE — Telephone Encounter (Signed)
Recieved e-mail medication refill request from patient, e-mail states:  Clinton Newton would like a refill of the following medications:     methocarbamol (ROBAXIN) 500 MG tablet [name], MD]   Patient Comment: I've down well not needing these but I do occasionally feel the need if my back is twingy. I note that I have a few left from your prescription months ago. Please call another 30 day into [facility] on [address]. If you need to see me, that is fine too.   No future appointments listed at this time.   Please advise

## 2017-04-13 ENCOUNTER — Ambulatory Visit (INDEPENDENT_AMBULATORY_CARE_PROVIDER_SITE_OTHER): Payer: BLUE CROSS/BLUE SHIELD | Admitting: Family Medicine

## 2017-04-13 ENCOUNTER — Encounter: Payer: Self-pay | Admitting: Family Medicine

## 2017-04-13 ENCOUNTER — Ambulatory Visit (INDEPENDENT_AMBULATORY_CARE_PROVIDER_SITE_OTHER)
Admission: RE | Admit: 2017-04-13 | Discharge: 2017-04-13 | Disposition: A | Discharge status: Home / Self Care | Payer: BLUE CROSS/BLUE SHIELD | Source: Ambulatory Visit | Attending: Family Medicine | Admitting: Family Medicine

## 2017-04-13 VITALS — BP 128/84 | HR 62 | Temp 98.4°F | Wt 181.6 lb

## 2017-04-13 DIAGNOSIS — R202 Paresthesia of skin: Secondary | ICD-10-CM

## 2017-04-13 DIAGNOSIS — G4700 Insomnia, unspecified: Secondary | ICD-10-CM | POA: Diagnosis not present

## 2017-04-13 DIAGNOSIS — R05 Cough: Secondary | ICD-10-CM

## 2017-04-13 DIAGNOSIS — R053 Chronic cough: Secondary | ICD-10-CM

## 2017-04-13 DIAGNOSIS — I10 Essential (primary) hypertension: Secondary | ICD-10-CM

## 2017-04-13 MED ORDER — ZALEPLON 5 MG PO CAPS
5.0000 mg | ORAL_CAPSULE | Freq: Every evening | ORAL | 0 refills | Status: DC | PRN
Start: 2017-04-13 — End: 2017-07-02

## 2017-04-13 NOTE — Progress Notes (Signed)
HPI:  Follow up Hypertension, insomnia, hyperlipidemia. Also several new issues. He seems upset that I do not advise and have advised against sonata for mild sleep issues. He has poor sleep when he travels and sleeps in hotels and then is in a bad mood the next day. He otherwise sleeps well. Prior PCP prescribed sleep aide. He likes to keep this on hand for travel. Aware of risks. Wants refill. He also has had dry cough for a few months. Denies shortness of breath, Hemoptysis, wheezing,fever, wheezing, vomiting. He does have some allergies, but is not taking anything currently for this. He also has a history of acid reflux and does feel he may have had a flare of acid prior to these symptoms starting. He has had some tingling in his hands bilaterally. He is not sure which fingers, thinks maybe all of the fingers. Only occurs with certain movements and is brief and relieved by change in position. On the right side it occurs when he abducts his shoulder. On the left side it occurs when he rests his elbow and supinates his hand. No sustained symptoms, weakness or pain.  ROS: See pertinent positives and negatives per HPI.  Past Medical History:  Diagnosis Date  . Arthritis    L3-L4  . Colon polyps   . Diverticulitis   . Hyperlipidemia   . Hypertension     Past Surgical History:  Procedure Laterality Date  . APPENDECTOMY    . COLONOSCOPY      Family History  Problem Relation Age of Onset  . Heart disease Father   . Colon cancer Neg Hx   . Pancreatic cancer Neg Hx   . Rectal cancer Neg Hx   . Stomach cancer Neg Hx     Social History   Social History  . Marital status: Married    Spouse name: N/A  . Number of children: N/A  . Years of education: N/A   Occupational History  . Retired    Social History Main Topics  . Smoking status: Never Smoker  . Smokeless tobacco: Never Used  . Alcohol use 4.2 oz/week    7 Glasses of wine per week  . Drug use: No  . Sexual activity: Yes    Other Topics Concern  . 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  amitriptyline (ELAVIL) 25 MG tablet, Take 1 tablet (25 mg total) by mouth at bedtime., Disp: 30 tablet, Rfl: 1 .  amLODipine (NORVASC) 5 MG tablet, Take 1 tablet (5 mg total) by mouth daily., Disp: 90 tablet, Rfl: 0 .  aspirin 81 MG EC tablet, Take 81 mg by mouth daily.  , Disp: , Rfl:  .  bisoprolol (ZEBETA) 5 MG tablet, Take 1 tablet (5 mg total) by mouth daily., Disp: 90 tablet, Rfl: 0 .  cholecalciferol (VITAMIN D) 1000 UNITS tablet, Take 1,000 Units by mouth daily., Disp: , Rfl:  .  Flaxseed, Linseed, (FLAXSEED OIL PO), Take by mouth as directed., Disp: , Rfl:  .  MAGNESIUM PO, Take by mouth daily., Disp: , Rfl:  .  methocarbamol (ROBAXIN)  500 MG tablet, Take 1 tablet (500 mg total) by mouth 2 (two) times daily as needed for muscle spasms., Disp: 30 tablet, Rfl: 0 .  Multiple Vitamin (MULTIVITAMIN) tablet, Take 1 tablet by mouth daily.  , Disp: , Rfl:  .  naproxen (NAPROSYN) 500 MG tablet, Take 1 tablet (500 mg total) by mouth 2 (two) times daily with a meal., Disp: 30 tablet, Rfl: 0 .  Omega-3 Fatty Acids (FISH OIL PO), Take by mouth daily., Disp: , Rfl:  .  Probiotic Product (PROBIOTIC PO), Take by mouth daily., Disp: , Rfl:  .  rosuvastatin (CRESTOR) 20 MG tablet, TAKE 1 BY MOUTH DAILY, Disp: 90 tablet, Rfl: 0 .  triamcinolone (NASACORT AQ) 55 MCG/ACT nasal inhaler, 2 sprays by Nasal route daily as needed.  , Disp: , Rfl:  .  zaleplon (SONATA) 5 MG capsule, Take 1 capsule (5 mg total) by mouth at bedtime as needed for sleep., Disp: 10 capsule, Rfl: 0  EXAM:  Vitals:   04/13/17 1410  BP: 128/84  Pulse: 62  Temp: 98.4 F (36.9  C)    Body mass index is 26.06 kg/m.  GENERAL: vitals reviewed and listed above, alert, oriented, appears well hydrated and in no acute distress  HEENT: atraumatic, conjunttiva clear, no obvious abnormalities on inspection of external nose and ears  NECK: no obvious masses on inspection, Normal range of motion however he has more motion and rotation of the head and neck to left, no bony tenderness to palpation, no soft tissue tenderness to palpation in the neck, negative Spurling  LUNGS: clear to auscultation bilaterally, no wheezes, rales or rhonchi, good air movement  CV: HRRR, no peripheral edema  MS: moves all extremities without noticeable abnormality, Normal inspection of the neck -see above, normal inspection of bilateral upper extremities, normal strength, sensitivity to light touch and reflexes in the upper extremities bilaterally  PSYCH: pleasant and cooperative, no obvious depression or anxiety  ASSESSMENT AND PLAN:  Discussed the following assessment and plan:  Insomnia, unspecified type -Risk/Interactions of sleep aids discussed, advised against, however he feels benefits outweigh risk - With this understanding, I agreed to a small amount of a refill to use on rare occasions  Paresthesia - Plan: TSH, Vitamin B12, Hemoglobin A1c -We'll check some labs -Consideration of neck plain films -He has not too concerned about this at this point and opted to do the labs for now  Chronic cough - Plan: DG Chest 2 View -Chest x-ray -Discussed common causes of chronic cough and will start a trial of acid reducer, then intranasal steroid if the chest x-ray is okay -Follow-up in 4-6 weeks  Essential hypertension - Plan: Basic metabolic panel, CBC  -Patient advised to return or notify a doctor immediately if symptoms worsen or persist or new concerns arise.  Patient Instructions  BEFORE YOU LEAVE: -follow up: 6 weeks -labs -xray sheet  Go get the xray.  Acid reflux  medication once daily for two weeks to see if helps with cough. If not flonase 2 sprays each nostril daily for 4 weeks.  We have ordered labs or studies at this visit. It can take up to 1-2 weeks for results and processing. IF results require follow up or explanation, we will call you with instructions. Clinically stable results will be released to your [facility]. If you have not heard from [place] or cannot find your results in [facility] in 2 weeks please contact our office at [phone].  If you are not yet signed  up for [facility], please consider signing up.           [name] R., DO

## 2017-04-13 NOTE — Patient Instructions (Signed)
BEFORE YOU LEAVE: -follow up: 6 weeks -labs -xray sheet  Go get the xray.  Acid reflux medication once daily for two weeks to see if helps with cough. If not flonase 2 sprays each nostril daily for 4 weeks.  We have ordered labs or studies at this visit. It can take up to 1-2 weeks for results and processing. IF results require follow up or explanation, we will call you with instructions. Clinically stable results will be released to your [facility]. If you have not heard from [place] or cannot find your results in [facility] in 2 weeks please contact our office at [phone].  If you are not yet signed up for [facility], please consider signing up.

## 2017-04-14 LAB — BASIC METABOLIC PANEL
BUN: 16 mg/dL (ref 6–23)
CO2: 31 meq/L (ref 19–32)
Calcium: 9.7 mg/dL (ref 8.4–10.5)
Chloride: 104 mEq/L (ref 96–112)
Creatinine, Ser: 0.85 mg/dL (ref 0.40–1.50)
GFR: 96.3 mL/min (ref >60.00)
GLUCOSE: 102 mg/dL — AB (ref 70–99)
Potassium: 4.6 mEq/L (ref 3.5–5.1)
SODIUM: 144 meq/L (ref 135–145)

## 2017-04-14 LAB — CBC
HCT: 48.2 % (ref 39.0–52.0)
HEMOGLOBIN: 16.5 g/dL (ref 13.0–17.0)
MCHC: 34.2 g/dL (ref 30.0–36.0)
MCV: 96 fl (ref 78.0–100.0)
PLATELETS: 188 10*3/uL (ref 150.0–400.0)
RBC: 5.01 Mil/uL (ref 4.22–5.81)
RDW: 13.6 % (ref 11.5–15.5)
WBC: 4.3 10*3/uL (ref 4.0–10.5)

## 2017-04-14 LAB — VITAMIN B12: Vitamin B-12: 286 pg/mL (ref 211–911)

## 2017-04-14 LAB — HEMOGLOBIN A1C: HEMOGLOBIN A1C: 5.4 % (ref 4.6–6.5)

## 2017-04-14 LAB — TSH: TSH: 2.81 u[IU]/mL (ref 0.35–4.50)

## 2017-04-20 ENCOUNTER — Encounter: Payer: Self-pay | Admitting: Nurse Practitioner

## 2017-04-23 ENCOUNTER — Other Ambulatory Visit: Payer: Self-pay

## 2017-04-23 MED ORDER — METRONIDAZOLE 500 MG PO TABS: 500.0000 mg | ORAL_TABLET | Freq: Three times a day (TID) | ORAL | 0 refills | Status: DC

## 2017-04-23 MED ORDER — CIPROFLOXACIN HCL 500 MG PO TABS: 500.0000 mg | ORAL_TABLET | Freq: Two times a day (BID) | ORAL | 0 refills | Status: DC

## 2017-05-03 ENCOUNTER — Encounter: Payer: Self-pay | Admitting: Family Medicine

## 2017-05-03 MED ORDER — AMLODIPINE BESYLATE 5 MG PO TABS: 5.0000 mg | ORAL_TABLET | Freq: Every day | ORAL | 0 refills | Status: DC

## 2017-05-03 MED ORDER — BISOPROLOL FUMARATE 5 MG PO TABS: 5.0000 mg | ORAL_TABLET | Freq: Every day | ORAL | 0 refills | Status: DC

## 2017-05-03 NOTE — Telephone Encounter (Signed)
Medications sent to the pharmacy by e-scribe for 90 days.  Seen by Dr. Maudie Mercury on 04/13/17 for insomnia and hypertension.

## 2017-05-25 ENCOUNTER — Encounter: Payer: Self-pay | Admitting: Family Medicine

## 2017-05-25 ENCOUNTER — Ambulatory Visit (INDEPENDENT_AMBULATORY_CARE_PROVIDER_SITE_OTHER): Payer: BLUE CROSS/BLUE SHIELD | Admitting: Family Medicine

## 2017-05-25 VITALS — BP 112/74 | HR 79 | Temp 98.1°F | Ht 70.0 in | Wt 191.5 lb

## 2017-05-25 DIAGNOSIS — R202 Paresthesia of skin: Secondary | ICD-10-CM

## 2017-05-25 DIAGNOSIS — E538 Deficiency of other specified B group vitamins: Secondary | ICD-10-CM | POA: Diagnosis not present

## 2017-05-25 DIAGNOSIS — R059 Cough, unspecified: Secondary | ICD-10-CM

## 2017-05-25 DIAGNOSIS — R05 Cough: Secondary | ICD-10-CM | POA: Diagnosis not present

## 2017-05-25 NOTE — Progress Notes (Signed)
HPI:  Follow up several issues:  Cough -for several months when evaluated last visit -hx allergies and GERD -did cxr, trial INS, acid reducer last visit -reports resolved with the INS - is so glad to no longer have symptoms -no sob, fevers, drainage, cough  Bilateral hand paresthesias: -did labs - B12 on lower end normal and b12 advised, considered neck films -hx lumbar DDD -reports much much better the last few weeks -taking b12 daily, tolerating well -still occ brief symptoms R shoulder only with certain movements, but much improved -no weakness, persistent numbness, pain  ROS: See pertinent positives and negatives per HPI.  Past Medical History:  Diagnosis Date  . Arthritis    L3-L4  . Colon polyps   . Diverticulitis   . Hyperlipidemia   . Hypertension     Past Surgical History:  Procedure Laterality Date  . APPENDECTOMY    . COLONOSCOPY      Family History  Problem Relation Age of Onset  . Heart disease Father   . Colon cancer Neg Hx   . Pancreatic cancer Neg Hx   . Rectal cancer Neg Hx   . Stomach cancer Neg Hx     Social History   Social History  . Marital status: Married    Spouse name: N/A  . Number of children: N/A  . Years of education: N/A   Occupational History  . Retired    Social History Main Topics  . Smoking status: Never Smoker  . Smokeless tobacco: Never Used  . Alcohol use 4.2 oz/week    7 Glasses of wine per week  . Drug use: No  . Sexual activity: Yes   Other Topics Concern  . 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  amitriptyline (ELAVIL) 25 MG tablet, Take 1 tablet (25 mg total) by mouth at bedtime., Disp: 30 tablet, Rfl: 1 .   amLODipine (NORVASC) 5 MG tablet, Take 1 tablet (5 mg total) by mouth daily., Disp: 90 tablet, Rfl: 0 .  aspirin 81 MG EC tablet, Take 81 mg by mouth daily.  , Disp: , Rfl:  .  bisoprolol (ZEBETA) 5 MG tablet, Take 1 tablet (5 mg total) by mouth daily., Disp: 90 tablet, Rfl: 0 .  cholecalciferol (VITAMIN D) 1000 UNITS tablet, Take 1,000 Units by mouth daily., Disp: , Rfl:  .  ciprofloxacin (CIPRO) 500 MG tablet, Take 1 tablet (500 mg total) by mouth 2 (two) times daily. (Patient taking differently: Take 500 mg by mouth 2 (two) times daily as needed. ), Disp: 20 tablet, Rfl: 0 .  Flaxseed, Linseed, (FLAXSEED OIL PO), Take by mouth as directed., Disp: , Rfl:  .  MAGNESIUM PO, Take by mouth daily., Disp: , Rfl:  .  methocarbamol (ROBAXIN) 500 MG tablet, Take 1 tablet (500 mg total) by mouth 2 (two) times daily as needed for muscle spasms., Disp: 30 tablet, Rfl: 0 .  metroNIDAZOLE (FLAGYL) 500 MG tablet, Take 1 tablet (500 mg total) by mouth 3 (three) times daily. (Patient taking differently: Take 500 mg by mouth 3 (three) times daily as  needed. ), Disp: 30 tablet, Rfl: 0 .  Multiple Vitamin (MULTIVITAMIN) tablet, Take 1 tablet by mouth daily.  , Disp: , Rfl:  .  naproxen (NAPROSYN) 500 MG tablet, Take 1 tablet (500 mg total) by mouth 2 (two) times daily with a meal., Disp: 30 tablet, Rfl: 0 .  Omega-3 Fatty Acids (FISH OIL PO), Take by mouth daily., Disp: , Rfl:  .  Probiotic Product (PROBIOTIC PO), Take by mouth daily., Disp: , Rfl:  .  rosuvastatin (CRESTOR) 20 MG tablet, TAKE 1 BY MOUTH DAILY, Disp: 90 tablet, Rfl: 0 .  triamcinolone (NASACORT AQ) 55 MCG/ACT nasal inhaler, 2 sprays by Nasal route daily as needed.  , Disp: , Rfl:  .  zaleplon (SONATA) 5 MG capsule, Take 1 capsule (5 mg total) by mouth at bedtime as needed for sleep., Disp: 10 capsule, Rfl: 0  EXAM:  Vitals:   05/25/17 1407  BP: 112/74  Pulse: 79  Temp: 98.1 F (36.7 C)    Body mass index is 27.48 kg/m.  GENERAL: vitals  reviewed and listed above, alert, oriented, appears well hydrated and in no acute distress  HEENT: atraumatic, conjunttiva clear, no obvious abnormalities on inspection of external nose and ears  NECK: no obvious masses on inspection  LUNGS: clear to auscultation bilaterally, no wheezes, rales or rhonchi, good air movement  CV: HRRR, no peripheral edema  MS: moves all extremities without noticeable abnormality  PSYCH: pleasant and cooperative, no obvious depression or anxiety  ASSESSMENT AND PLAN:  Discussed the following assessment and plan:  Cough -resolved, suspect wa PND from allergies  -cont ins for the summer/early fall then try trial off  Paresthesia -much improved, may be combo b12 lower and radicular symptoms - discussed with pt -he wants to cont b12 and hold off on further eval for now given degree of improvement -advised next step would be imaging neck if any worsening, new concerns or persists - he agrees to call  B12 deficiency -continue sublingual b12 -advised recheck levels at follow up  Advised follow up in around 4 months if no concerns.  Patient advised to return or notify a doctor immediately if symptoms worsen or persist or new concerns arise.  He declined AVS.  There are no Patient Instructions on file for this visit.  Clinton Newton R., DO

## 2017-06-04 ENCOUNTER — Other Ambulatory Visit: Payer: Self-pay | Admitting: Family Medicine

## 2017-06-04 ENCOUNTER — Encounter: Payer: Self-pay | Admitting: Family Medicine

## 2017-06-04 MED ORDER — ROSUVASTATIN CALCIUM 20 MG PO TABS: ORAL_TABLET | ORAL | 0 refills | Status: DC

## 2017-06-04 NOTE — Telephone Encounter (Signed)
Rx done. 

## 2017-06-06 ENCOUNTER — Other Ambulatory Visit: Payer: Self-pay | Admitting: Family Medicine

## 2017-07-02 ENCOUNTER — Other Ambulatory Visit: Payer: Self-pay | Admitting: Family Medicine

## 2017-07-05 ENCOUNTER — Other Ambulatory Visit: Payer: Self-pay | Admitting: Nurse Practitioner

## 2017-07-05 MED ORDER — ZALEPLON 5 MG PO CAPS: 5.0000 mg | ORAL_CAPSULE | Freq: Every evening | ORAL | 0 refills | Status: DC | PRN

## 2017-07-05 NOTE — Telephone Encounter (Signed)
Rx done. 

## 2017-07-08 ENCOUNTER — Telehealth: Payer: Self-pay | Admitting: Nurse Practitioner

## 2017-07-08 ENCOUNTER — Other Ambulatory Visit: Payer: Self-pay | Admitting: Nurse Practitioner

## 2017-07-08 ENCOUNTER — Encounter: Payer: Self-pay | Admitting: Nurse Practitioner

## 2017-07-08 MED ORDER — CIPROFLOXACIN HCL 500 MG PO TABS: 500.0000 mg | ORAL_TABLET | Freq: Two times a day (BID) | ORAL | 0 refills | Status: DC

## 2017-07-08 NOTE — Telephone Encounter (Signed)
Noted. I was not sure if it was okay to refill. Thank you for taking care of this.

## 2017-07-08 NOTE — Telephone Encounter (Signed)
Beth, I refill cipro. Had CMA check and looks like I did it okay. I emailed patient to let him know. Thanks

## 2017-07-15 ENCOUNTER — Encounter: Payer: Self-pay | Admitting: Family Medicine

## 2017-08-17 ENCOUNTER — Encounter: Payer: Self-pay | Admitting: Physical Medicine & Rehabilitation

## 2017-08-17 ENCOUNTER — Encounter: Payer: Self-pay | Admitting: Nurse Practitioner

## 2017-08-17 ENCOUNTER — Other Ambulatory Visit: Payer: Self-pay

## 2017-08-17 ENCOUNTER — Other Ambulatory Visit: Payer: Self-pay | Admitting: Physical Medicine & Rehabilitation

## 2017-08-17 NOTE — Telephone Encounter (Signed)
Recieved emailed medication refill request for methocarbamol, patient was last seen in clinic on 12-22-16 with follow up as needed in last note  Please advise

## 2017-08-18 ENCOUNTER — Telehealth: Payer: Self-pay | Admitting: *Deleted

## 2017-08-18 MED ORDER — METHOCARBAMOL 500 MG PO TABS: 500.0000 mg | ORAL_TABLET | Freq: Every evening | ORAL | 0 refills | Status: DC | PRN

## 2017-08-18 NOTE — Telephone Encounter (Signed)
I spoke with the pharmacy and they have #30 of the 500 available currently. Order sent to pharmacy. Clinton Newton notified and aware that to get any further refills he will need an appt.Marland Kitchen

## 2017-08-18 NOTE — Telephone Encounter (Signed)
-----   Message from [name], MD sent at 08/18/2017  7:42 AM EDT ----- May call in metho carbamol 500mg  1 po qhs prn, #30 1 RF If 500mg  not available then can call in 750mg

## 2017-08-23 ENCOUNTER — Other Ambulatory Visit: Payer: Self-pay | Admitting: Family Medicine

## 2017-08-23 ENCOUNTER — Telehealth: Payer: Self-pay

## 2017-08-23 ENCOUNTER — Other Ambulatory Visit: Payer: Self-pay

## 2017-08-23 ENCOUNTER — Ambulatory Visit: Payer: BLUE CROSS/BLUE SHIELD | Admitting: Physical Medicine & Rehabilitation

## 2017-08-23 ENCOUNTER — Encounter: Payer: Self-pay | Admitting: Nurse Practitioner

## 2017-08-23 MED ORDER — METRONIDAZOLE 500 MG PO TABS: 500.0000 mg | ORAL_TABLET | Freq: Three times a day (TID) | ORAL | 0 refills | Status: DC

## 2017-08-23 MED ORDER — BISOPROLOL FUMARATE 5 MG PO TABS: 5.0000 mg | ORAL_TABLET | Freq: Every day | ORAL | 0 refills | Status: DC

## 2017-08-23 MED ORDER — AMLODIPINE BESYLATE 5 MG PO TABS: 5.0000 mg | ORAL_TABLET | Freq: Every day | ORAL | 0 refills | Status: DC

## 2017-08-23 MED ORDER — CIPROFLOXACIN HCL 500 MG PO TABS: 500.0000 mg | ORAL_TABLET | Freq: Two times a day (BID) | ORAL | 0 refills | Status: DC

## 2017-08-23 NOTE — Telephone Encounter (Signed)
Prescriptions reordered. Also a reminder put on the SIG reminding the patient to have an office visit.

## 2017-08-23 NOTE — Telephone Encounter (Signed)
Rx done and pt notified via Mychart message. 

## 2017-08-23 NOTE — Telephone Encounter (Signed)
-----   Message from Willia Craze, NP sent at 08/23/2017  2:39 PM EDT ----- Clinton Newton [facility] me. [facility] and flagyl because they were not in bottles. Can you call in the same cipro / flagyl Rx as before. Let's give him 7 days worth. Also, I asked him to schedule a follow up, please make sure he did get one. Thanks

## 2017-09-06 ENCOUNTER — Ambulatory Visit: Payer: Self-pay | Admitting: Family Medicine

## 2017-09-08 ENCOUNTER — Ambulatory Visit: Payer: BLUE CROSS/BLUE SHIELD | Admitting: Nurse Practitioner

## 2017-09-08 ENCOUNTER — Encounter: Payer: Self-pay | Admitting: Nurse Practitioner

## 2017-09-08 VITALS — BP 120/70 | HR 78 | Ht 68.0 in | Wt 176.0 lb

## 2017-09-08 DIAGNOSIS — Z86010 Personal history of colonic polyps: Secondary | ICD-10-CM

## 2017-09-08 DIAGNOSIS — K5790 Diverticulosis of intestine, part unspecified, without perforation or abscess without bleeding: Secondary | ICD-10-CM

## 2017-09-08 MED ORDER — CIPROFLOXACIN HCL 500 MG PO TABS: 500.0000 mg | ORAL_TABLET | Freq: Two times a day (BID) | ORAL | 0 refills | Status: DC

## 2017-09-08 MED ORDER — METRONIDAZOLE 500 MG PO TABS: 500.0000 mg | ORAL_TABLET | Freq: Three times a day (TID) | ORAL | 0 refills | Status: DC

## 2017-09-08 MED ORDER — NA SULFATE-K SULFATE-MG SULF 17.5-3.13-1.6 GM/177ML PO SOLN: ORAL | 0 refills | Status: DC

## 2017-09-08 NOTE — Patient Instructions (Addendum)
If you are age 65 or older, your body mass index should be between 23-30. Your There is no height or weight on file to calculate BMI. If this is out of the aforementioned range listed, please consider follow up with your Primary Care Provider.  If you are age 15 or younger, your body mass index should be between 19-25. Your There is no height or weight on file to calculate BMI. If this is out of the aformentioned range listed, please consider follow up with your Primary Care Provider.   You have been scheduled for a colonoscopy. Please follow written instructions given to you at your visit today.  Please pick up your prep supplies at the pharmacy within the next 1-3 days. If you use inhalers (even only as needed), please bring them with you on the day of your procedure. Your physician has requested that you go to www.startemmi.com and enter the access code given to you at your visit today. This web site gives a general overview about your procedure. However, you should still follow specific instructions given to you by our office regarding your preparation for the procedure.  We have sent the following medications to your pharmacy for you to pick up at your convenience: Suprep Flagyl Cipro  Thank you for choosing me and [facility].   [name], NP

## 2017-09-08 NOTE — Progress Notes (Signed)
     Chief Complaint:  Follow up on diverticulitis.    HPI Patient is a 66 yo male with a hx of sigmoid diverticultitis documented on CT scan in 2015 as well as hx of adenomatous colon polyps. He was due for surveillance colonoscopy in April.   It has been about 9 months since we last saw him. I've phoned in antibiotics a couple of times since then when he felt onset of symptoms described at LLQ fullness. Each time the antibiotics, a long with a low fiber diet has "nipped it in the bud". I asked him to come in for follow up.   Clinton Newton feels fine at present. He takes fiber supplements a couple of times a day. He travels a lot, likes to keep cipro / flagyl on hand in case of a diverticulitis flare.  He tends to start the antibiotic at the first sign of left lower quadrant fullness.  He has never noticed any fevers associated with the discomfort.  No dysuria or other urinary symptoms.Has not correlated the left lower quadrant discomfort with constipation.    Past Medical History:  Diagnosis Date  . Arthritis    L3-L4  . Colon polyps   . Diverticulitis   . Hyperlipidemia   . Hypertension     Patient's surgical history, family medical history, social history, medications and allergies were all reviewed in Epic    Physical Exam: There were no vitals taken for this visit.  GENERAL:  Well developed white male in NAD PSYCH: :Pleasant, cooperative, normal affect EENT:  conjunctiva pink, mucous membranes moist, neck supple without masses CARDIAC:  RRR, no murmur heard, no peripheral edema PULM: Normal respiratory effort, lungs CTA bilaterally, no wheezing ABDOMEN:  Nondistended, soft, nontender. No obvious masses, no hepatomegaly,  normal bowel sounds SKIN:  turgor, no lesions seen Musculoskeletal:  Normal muscle tone, normal strength NEURO: Alert and oriented x 3, no focal neurologic deficits   ASSESSMENT and PLAN:  1. Pleasant 66 year old male with hx of sigmoid diverticulitis in 2015.  Treated empirically a few times since then (when out of town) when he develops the same LLQ fullness like in 2015.   -We discussed trying to refrain from immediate initiation of antibiotics at the first sign of LLQ discomfort . Perhaps the episodes of LLQ fullness don't always represent early diverticulitis. Otherwise, he is going to need a surgical evaluation at some point.    2. Hx of adenomatous colon polyps, overdue for surveillance colonoscopy. He previously declined but agreeable now.   -Patient will be scheduled for a colonoscopy with possible polypectomy.  The risks and benefits of the procedure were discussed and the patient agrees to proceed.     Clinton Newton , NP 09/08/2017, 10:13 AM

## 2017-09-09 ENCOUNTER — Encounter: Payer: Self-pay | Admitting: Nurse Practitioner

## 2017-09-09 ENCOUNTER — Encounter: Payer: Self-pay | Admitting: Family Medicine

## 2017-09-09 ENCOUNTER — Ambulatory Visit: Payer: BLUE CROSS/BLUE SHIELD | Admitting: Family Medicine

## 2017-09-09 VITALS — BP 128/82 | HR 58 | Temp 98.1°F | Ht 68.0 in | Wt 178.5 lb

## 2017-09-09 DIAGNOSIS — M79641 Pain in right hand: Secondary | ICD-10-CM

## 2017-09-09 DIAGNOSIS — E785 Hyperlipidemia, unspecified: Secondary | ICD-10-CM

## 2017-09-09 DIAGNOSIS — E538 Deficiency of other specified B group vitamins: Secondary | ICD-10-CM

## 2017-09-09 DIAGNOSIS — Z23 Encounter for immunization: Secondary | ICD-10-CM | POA: Diagnosis not present

## 2017-09-09 DIAGNOSIS — I10 Essential (primary) hypertension: Secondary | ICD-10-CM | POA: Diagnosis not present

## 2017-09-09 MED ORDER — ROSUVASTATIN CALCIUM 20 MG PO TABS: 20.0000 mg | ORAL_TABLET | Freq: Every day | ORAL | 3 refills | Status: DC

## 2017-09-09 NOTE — Patient Instructions (Addendum)
BEFORE YOU LEAVE: -Refill his Crestor for 1 year -tetanus booster -labs -follow up: 3-4 months  We have ordered labs or studies at this visit. It can take up to 1-2 weeks for results and processing. IF results require follow up or explanation, we will call you with instructions. Clinically stable results will be released to your [facility]. If you have not heard from [place] or cannot find your results in [facility] in 2 weeks please contact our office at [phone].  If you are not yet signed up for [facility], please consider signing up.   We recommend the following healthy lifestyle for LIFE: 1) Small portions. But, make sure to get regular (at least 3 per day), healthy meals and small healthy snacks if needed.  2) Eat a healthy clean diet.   TRY TO EAT: -at least 5-7 servings of low sugar, colorful, and nutrient rich vegetables per day (not corn, potatoes or bananas.) -berries are the best choice if you wish to eat fruit (only eat small amounts if trying to reduce weight)  -lean meets (fish, white meat of chicken or [place]) -vegan proteins for some meals - beans or tofu, whole grains, nuts and seeds -Replace bad fats with good fats - good fats include: fish, nuts and seeds, canola oil, olive oil -small amounts of low fat or non fat dairy -small amounts of100 % whole grains - check the lables  AVOID: -SUGAR, sweets, anything with added sugar, corn syrup or sweeteners -if you must have a sweetener, small amounts of stevia may be best -sweetened beverages -simple starches (rice, bread, potatoes, pasta, chips, etc - small amounts of 100% whole grains are ok) -red meat, pork, butter -fried foods, fast food, processed food, excessive dairy, eggs and coconut.  3)Get at least 150 minutes of sweaty aerobic exercise per week.  4)Reduce stress - consider counseling, meditation and relaxation to balance other aspects of your life.

## 2017-09-09 NOTE — Progress Notes (Signed)
HPI:   Due for labs, flu shot, colon cancer screening, tetanus booster Bruised his hand boxing about a week ago.  He had a little bit of swelling, but this is mostly resolved and the pain is mostly resolved at this point.  He does not think he broke anything.  Next visit his right hand.  Denies weakness, numbness or persistent issues with the hand.  Hypertension: -Medications include aspirin, amlodipine, bisoprolol  Hyperlipidemia: -Medications: Rosuvastatin Wants refill on medication-  Low B12: Advised supplementation  Insomnia: -I advised against sleep aids in the past as did not feel benefits > risks, he was upset by this -He likes to take Sonata when he travels-because he has poor sleep and then is in a bad mood the next day  Degenerative disc disease, low back pain: -He is a Curator -Uses NSAIDs and muscle relaxers from time to time -MRI in 2017 showed right facet arthritis at L3-L4, chronic soft disc protrusion at L4-L5 with possible nerve root compression, he was referred to a specialist -Dr. Letta Pate ROS: See pertinent positives and negatives per HPI.  Past Medical History:  Diagnosis Date  . Arthritis    L3-L4  . Colon polyps   . Diverticulitis   . Hyperlipidemia   . Hypertension     Past Surgical History:  Procedure Laterality Date  . APPENDECTOMY    . COLONOSCOPY      Family History  Problem Relation Age of Onset  . Heart disease Father   . Colon cancer Neg Hx   . Pancreatic cancer Neg Hx   . Rectal cancer Neg Hx   . Stomach cancer Neg Hx     Social History   Socioeconomic History  . Marital status: Married    Spouse name: None  . Number of children: 0  . Years of education: None  . Highest education level: None  Social Needs  . Financial resource strain: None  . Food insecurity - worry: None  . Food insecurity - inability: None  . Transportation needs - medical: None  . Transportation needs - non-medical: None  Occupational History  .  Occupation: Retired  Tobacco Use  . Smoking status: Never Smoker  . Smokeless tobacco: Never Used  Substance and Sexual Activity  . Alcohol use: Yes    Alcohol/week: 4.2 oz    Types: 7 Glasses of wine per week  . Drug use: No  . Sexual activity: Yes  Other Topics Concern  . 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  amLODipine (NORVASC) 5 MG tablet, Take 1 tablet (5 mg total) by mouth daily., Disp: 30 tablet, Rfl: 0 .  aspirin 81 MG EC tablet, Take 81 mg by mouth daily.  , Disp: , Rfl:  .  bisoprolol (ZEBETA) 5 MG tablet, Take 1 tablet (5 mg total) by mouth daily., Disp: 30 tablet, Rfl: 0 .  cholecalciferol (VITAMIN D) 1000 UNITS tablet, Take 1,000 Units by mouth daily., Disp: , Rfl:  .  ciprofloxacin (CIPRO) 500 MG tablet, Take 1 tablet (500 mg total) 2 (two) times daily by mouth. Please schedule an office visit, Disp:  20 tablet, Rfl: 0 .  Flaxseed, Linseed, (FLAXSEED OIL PO), Take by mouth as directed., Disp: , Rfl:  .  MAGNESIUM PO, Take by mouth daily., Disp: , Rfl:  .  methocarbamol (ROBAXIN) 500 MG tablet, Take 1 tablet (500 mg total) by mouth at bedtime as needed for muscle spasms., Disp: 30 tablet, Rfl: 0 .  metroNIDAZOLE (FLAGYL) 500 MG tablet, Take 1 tablet (500 mg total) 3 (three) times daily by mouth. Please schedule an office visit, Disp: 30 tablet, Rfl: 0 .  Multiple Vitamin (MULTIVITAMIN) tablet, Take 1 tablet by mouth daily.  , Disp: , Rfl:  .  Na Sulfate-K Sulfate-Mg Sulf 17.5-3.13-1.6 GM/177ML SOLN, Suprep-Use as directed, Disp: 354 mL, Rfl: 0 .  naproxen (NAPROSYN) 500 MG tablet, Take 1 tablet (500 mg total) by mouth 2 (two) times daily with a meal., Disp: 30 tablet, Rfl: 0 .  Omega-3 Fatty  Acids (FISH OIL PO), Take by mouth daily., Disp: , Rfl:  .  Probiotic Product (PROBIOTIC PO), Take by mouth daily., Disp: , Rfl:  .  rosuvastatin (CRESTOR) 20 MG tablet, TAKE 1 TABLET BY MOUTH EVERY DAY, Disp: 90 tablet, Rfl: 0  EXAM:  Vitals:   09/09/17 1619  BP: 128/82  Pulse: (!) 58  Temp: 98.1 F (36.7 C)    Body mass index is 27.14 kg/m.  GENERAL: vitals reviewed and listed above, alert, oriented, appears well hydrated and in no acute distress  HEENT: atraumatic, conjunttiva clear, no obvious abnormalities on inspection of external nose and ears  NECK: no obvious masses on inspection  LUNGS: clear to auscultation bilaterally, no wheezes, rales or rhonchi, good air movement  CV: HRRR, no peripheral edema  MS: moves all extremities without noticeable abnormality, mild bruising of the right dorsal hand, no bony tenderness to palpation throughout the hand or wrist, normal strength and function of the hand and fingers  PSYCH: pleasant and cooperative, no obvious depression or anxiety  ASSESSMENT AND PLAN:  Discussed the following assessment and plan:  Hyperlipidemia, unspecified hyperlipidemia type  B12 deficiency  Essential hypertension - Plan: Basic metabolic panel, CBC  Pain of right hand  -Labs today -Tetanus booster -Offered x-rays of the hand to ensure no fracture, he declined as he feels like it is much better at this point -Continue current medications, refills as needed -Reports he is set up for his colonoscopy -Patient advised to return or notify a doctor immediately if symptoms worsen or persist or new concerns arise.  Patient Instructions  BEFORE YOU LEAVE: -Refill his Crestor for 1 year -tetanus booster -labs -follow up: 3-4 months  We have ordered labs or studies at this visit. It can take up to 1-2 weeks for results and processing. IF results require follow up or explanation, we will call you with instructions. Clinically stable results will be  released to your [facility]. If you have not heard from [place] or cannot find your results in [facility] in 2 weeks please contact our office at [phone].  If you are not yet signed up for [facility], please consider signing up.   We recommend the following healthy lifestyle for LIFE: 1) Small portions. But, make sure to get regular (at least 3 per day), healthy meals and small healthy snacks if needed.  2) Eat a healthy clean diet.   TRY TO EAT: -at least 5-7 servings of low sugar, colorful, and nutrient rich vegetables per day (not corn, potatoes or bananas.) -berries are the best choice if you wish to eat fruit (  only eat small amounts if trying to reduce weight)  -lean meets (fish, white meat of chicken or [place]) -vegan proteins for some meals - beans or tofu, whole grains, nuts and seeds -Replace bad fats with good fats - good fats include: fish, nuts and seeds, canola oil, olive oil -small amounts of low fat or non fat dairy -small amounts of100 % whole grains - check the lables  AVOID: -SUGAR, sweets, anything with added sugar, corn syrup or sweeteners -if you must have a sweetener, small amounts of stevia may be best -sweetened beverages -simple starches (rice, bread, potatoes, pasta, chips, etc - small amounts of 100% whole grains are ok) -red meat, pork, butter -fried foods, fast food, processed food, excessive dairy, eggs and coconut.  3)Get at least 150 minutes of sweaty aerobic exercise per week.  4)Reduce stress - consider counseling, meditation and relaxation to balance other aspects of your life.          [name] R., DO

## 2017-09-09 NOTE — Addendum Note (Signed)
Addended by: Agnes Lawrence on: 09/09/2017 04:53 PM   Modules accepted: Orders

## 2017-09-09 NOTE — Addendum Note (Signed)
Addended by: Agnes Lawrence on: 09/09/2017 04:57 PM   Modules accepted: Orders

## 2017-09-10 LAB — CBC
HEMATOCRIT: 48.5 % (ref 39.0–52.0)
HEMOGLOBIN: 16.4 g/dL (ref 13.0–17.0)
MCHC: 33.9 g/dL (ref 30.0–36.0)
MCV: 96.8 fl (ref 78.0–100.0)
PLATELETS: 230 10*3/uL (ref 150.0–400.0)
RBC: 5.01 Mil/uL (ref 4.22–5.81)
RDW: 12.7 % (ref 11.5–15.5)
WBC: 5.8 10*3/uL (ref 4.0–10.5)

## 2017-09-10 LAB — BASIC METABOLIC PANEL
BUN: 12 mg/dL (ref 6–23)
CHLORIDE: 104 meq/L (ref 96–112)
CO2: 32 meq/L (ref 19–32)
Calcium: 10.1 mg/dL (ref 8.4–10.5)
Creatinine, Ser: 0.78 mg/dL (ref 0.40–1.50)
GFR: 106.2 mL/min (ref >60.00)
GLUCOSE: 105 mg/dL — AB (ref 70–99)
POTASSIUM: 4.7 meq/L (ref 3.5–5.1)
SODIUM: 145 meq/L (ref 135–145)

## 2017-09-10 NOTE — Progress Notes (Signed)
Reviewed and agree with management plan.  Aryanna Shaver T. Elisheba Mcdonnell, MD FACG 

## 2017-09-19 ENCOUNTER — Other Ambulatory Visit: Payer: Self-pay | Admitting: Family Medicine

## 2017-09-20 ENCOUNTER — Encounter: Payer: Self-pay | Admitting: Family Medicine

## 2017-09-21 ENCOUNTER — Ambulatory Visit (INDEPENDENT_AMBULATORY_CARE_PROVIDER_SITE_OTHER)
Admission: RE | Admit: 2017-09-21 | Discharge: 2017-09-21 | Disposition: A | Discharge status: Home / Self Care | Payer: BLUE CROSS/BLUE SHIELD | Source: Ambulatory Visit | Attending: Family Medicine | Admitting: Family Medicine

## 2017-09-21 ENCOUNTER — Other Ambulatory Visit: Payer: Self-pay | Admitting: *Deleted

## 2017-09-21 DIAGNOSIS — S6991XA Unspecified injury of right wrist, hand and finger(s), initial encounter: Secondary | ICD-10-CM | POA: Diagnosis not present

## 2017-09-24 ENCOUNTER — Other Ambulatory Visit: Payer: Self-pay | Admitting: *Deleted

## 2017-09-24 ENCOUNTER — Encounter: Payer: Self-pay | Admitting: Family Medicine

## 2017-09-24 DIAGNOSIS — S6991XA Unspecified injury of right wrist, hand and finger(s), initial encounter: Secondary | ICD-10-CM

## 2017-10-01 ENCOUNTER — Ambulatory Visit: Payer: Self-pay

## 2017-10-01 ENCOUNTER — Ambulatory Visit (INDEPENDENT_AMBULATORY_CARE_PROVIDER_SITE_OTHER): Payer: Medicare Other | Admitting: Sports Medicine

## 2017-10-01 ENCOUNTER — Encounter: Payer: Self-pay | Admitting: Sports Medicine

## 2017-10-01 VITALS — BP 130/78 | HR 70 | Ht 68.0 in | Wt 180.8 lb

## 2017-10-01 DIAGNOSIS — M79641 Pain in right hand: Secondary | ICD-10-CM | POA: Diagnosis not present

## 2017-10-01 DIAGNOSIS — S60221A Contusion of right hand, initial encounter: Secondary | ICD-10-CM

## 2017-10-01 NOTE — Procedures (Signed)
LIMITED MSK ULTRASOUND OF the right hand Images were obtained and interpreted by myself, Teresa Coombs, DO  Images have been saved and stored to PACS system. Images obtained on: GE S7 Ultrasound machine  FINDINGS:   Second third and fourth metacarpal heads are intact with a small indention over the third metacarpal head consistent with a bony impact fracture  Significant soft tissue swelling with flattening and swelling of the second and third extensor tendons.  Sheath is swollen as well with normal-appearing connective tissue otherwise.  IMPRESSION:  1. Hand contusion with slight impacted fracture of the third metacarpal head 2. Extensor tendons intact

## 2017-10-01 NOTE — Assessment & Plan Note (Signed)
Significant soft tissue damage as well as small bony indention of the distal metacarpal of the third finger.  This explains the significant amount of swelling and bruising that he has had and is essentially nondisplaced fracture.  Given the duration of symptoms and overall improvement no further treatment at this time other than with symptomatic treatment including topical NSAIDs, soaking and avoidance activities recommended.  He will follow-up if any lack of improvement over the next 3-4 weeks otherwise as needed.

## 2017-10-01 NOTE — Progress Notes (Signed)
Clinton Newton. Clinton Newton, [place] [facility] [place]  Clinton Newton - 66 y.o. male MRN [id number]  Date of birth: 06/01/1951   Scribe for today's visit: Josepha Pigg, CMA    SUBJECTIVE:  Clinton Newton is here for New Patient (Initial Visit) (RT hand pain) .  Referred by: Rodena Goldmann, DO His RT hand pain symptoms INITIALLY: Began around 09/02/17 when he injured his hand while boxing.  Described as moderate sharp pain when stretching the hand and also when making a fist, radiating from fingers to posterior aspect of the RT hand. The pain was excruciating at the time of injury. He immediately placed his hand under cold water. He hit a hard spot on the bag.  Worsened with stretching and making a fist Improved with rest Additional associated symptoms include: He continues to have tenderness and swelling on the posterior aspect of the hand. There was bruising present at the time of injury but that has resolved. He denies weakness in the RT hand.   X-ray of the RT hand was done 09/21/17 and showed the following: IMPRESSION: No acute osseous abnormalities.    At this time symptoms are improving compared to onset, initial pain is gone but pain is still present with certain movements.  He has been using an OTC topical NSAID that he got from [place] and that has provided some relief.     ROS Denies night time disturbances. Denies fevers, chills, or night sweats. Denies unexplained weight loss. Denies personal history of cancer. Denies changes in bowel or bladder habits. Denies recent unreported falls. Denies new or worsening dyspnea or wheezing. Denies headaches or dizziness.  Reports numbness in both hands that seems to occur randomly.  Denies dizziness or presyncopal episodes Denies lower extremity edema    HISTORY & PERTINENT PRIOR DATA:  Prior History reviewed and updated per electronic medical record.  Significant history, findings,  studies and interim changes include:  reports that  has never smoked. he has never used smokeless tobacco. Recent Labs    04/13/17 1514  HGBA1C 5.4   No specialty comments available.  Problem  Contusion of Right Hand   OBJECTIVE:  VS:  HT:5\' 8"  (172.7 cm)   WT:180 lb 12.8 oz (82 kg)  BMI:27.5    BP:130/78  HR:70bpm  TEMP: ( )  RESP:95 %  PHYSICAL EXAM: Constitutional: WDWN, Non-toxic appearing. Psychiatric: Alert & appropriately interactive. Not depressed or anxious appearing. Respiratory: No increased work of breathing. Trachea Midline Eyes: Pupils are equal. EOM intact without nystagmus. No scleral icterus  UPPER EXTREMITIES No clubbing or cyanosis appreciated Capillary Refill is normal, less than 2 seconds No signficant upper extremity generalized edema Radial Pulses: Normal and symmetrically palpable Sensation in UE dermatomes: intact to light touch   Right hand: Overall well aligned he does have a generalized amount of swelling across the dorsum of the second third and fourth metacarpals.  He has full flexion extension.  Pain is worse with slight radial deviation of the second and third fingers and slight flexion.  Good capillary refill.  No significant skin breakdown.  Slight senile changes.  ASSESSMENT & PLAN:   1. Right hand pain   2. Contusion of right hand, initial encounter    PLAN:    Contusion of right hand Significant soft tissue damage as well as small bony indention of the distal metacarpal of the third finger.  This explains the significant amount of swelling and bruising that he has  had and is essentially nondisplaced fracture.  Given the duration of symptoms and overall improvement no further treatment at this time other than with symptomatic treatment including topical NSAIDs, soaking and avoidance activities recommended.  He will follow-up if any lack of improvement over the next 3-4 weeks otherwise as  needed.   ++++++++++++++++++++++++++++++++++++++++++++ Orders & Meds: Orders Placed This Encounter  Procedures  . [place] LIMITED JOINT SPACE STRUCTURES UP RIGHT(NO LINKED CHARGES)    No orders of the defined types were placed in this encounter.   ++++++++++++++++++++++++++++++++++++++++++++ Follow-up: Return if symptoms worsen or fail to improve.   Pertinent documentation may be included in additional procedure notes, imaging studies, problem based documentation and patient instructions. Please see these sections of the encounter for additional information regarding this visit. CMA/ATC served as Education administrator during this visit. History, Physical, and Plan performed by medical provider. Documentation and orders reviewed and attested to.      Gerda Diss, [place] [facility] Physician

## 2017-10-03 ENCOUNTER — Encounter: Payer: Self-pay | Admitting: Sports Medicine

## 2017-10-25 ENCOUNTER — Encounter: Payer: Self-pay | Admitting: Nurse Practitioner

## 2017-11-04 ENCOUNTER — Encounter: Payer: Self-pay | Admitting: Family Medicine

## 2017-11-09 ENCOUNTER — Encounter: Payer: Self-pay | Admitting: Gastroenterology

## 2017-11-09 ENCOUNTER — Ambulatory Visit (AMBULATORY_SURGERY_CENTER): Payer: Medicare Other | Admitting: Gastroenterology

## 2017-11-09 ENCOUNTER — Other Ambulatory Visit: Payer: Self-pay

## 2017-11-09 VITALS — BP 141/91 | HR 75 | Temp 97.3°F | Resp 22 | Ht 68.0 in | Wt 176.0 lb

## 2017-11-09 DIAGNOSIS — D123 Benign neoplasm of transverse colon: Secondary | ICD-10-CM | POA: Diagnosis not present

## 2017-11-09 DIAGNOSIS — Z86010 Personal history of colonic polyps: Secondary | ICD-10-CM

## 2017-11-09 DIAGNOSIS — E669 Obesity, unspecified: Secondary | ICD-10-CM | POA: Diagnosis not present

## 2017-11-09 DIAGNOSIS — I10 Essential (primary) hypertension: Secondary | ICD-10-CM | POA: Diagnosis not present

## 2017-11-09 MED ORDER — SODIUM CHLORIDE 0.9 % IV SOLN: 500.0000 mL | Freq: Once | INTRAVENOUS | Status: DC

## 2017-11-09 NOTE — Progress Notes (Signed)
Called to room to assist during endoscopic procedure.  Patient ID and intended procedure confirmed with present staff. Received instructions for my participation in the procedure from the performing physician.  

## 2017-11-09 NOTE — Progress Notes (Signed)
Report to PACU, RN, vss, BBS= Clear.  

## 2017-11-09 NOTE — Patient Instructions (Signed)
YOU HAD AN ENDOSCOPIC PROCEDURE TODAY AT [facility]:   Refer to the procedure report that was given to you for any specific questions about what was found during the examination.  If the procedure report does not answer your questions, please call your gastroenterologist to clarify.  If you requested that your care partner not be given the details of your procedure findings, then the procedure report has been included in a sealed envelope for you to review at your convenience later.  YOU SHOULD EXPECT: Some feelings of bloating in the abdomen. Passage of more gas than usual.  Walking can help get rid of the air that was put into your GI tract during the procedure and reduce the bloating. If you had a lower endoscopy (such as a colonoscopy or flexible sigmoidoscopy) you may notice spotting of blood in your stool or on the toilet paper. If you underwent a bowel prep for your procedure, you may not have a normal bowel movement for a few days.  Please Note:  You might notice some irritation and congestion in your nose or some drainage.  This is from the oxygen used during your procedure.  There is no need for concern and it should clear up in a day or so.  SYMPTOMS TO REPORT IMMEDIATELY:   Following lower endoscopy (colonoscopy or flexible sigmoidoscopy):  Excessive amounts of blood in the stool  Significant tenderness or worsening of abdominal pains  Swelling of the abdomen that is new, acute  Fever of 100F or higher  Please see handout on polyps, diverticulosis, hemorrhoids, and high fiber diet.    For urgent or emergent issues, a gastroenterologist can be reached at any hour by calling [phone].   DIET:  We do recommend a small meal at first, but then you may proceed to your regular diet.  Drink plenty of fluids but you should avoid alcoholic beverages for 24 hours.  ACTIVITY:  You should plan to take it easy for the rest of today and you should NOT DRIVE or use heavy  machinery until tomorrow (because of the sedation medicines used during the test).    FOLLOW UP: Our staff will call the number listed on your records the next business day following your procedure to check on you and address any questions or concerns that you may have regarding the information given to you following your procedure. If we do not reach you, we will leave a message.  However, if you are feeling well and you are not experiencing any problems, there is no need to return our call.  We will assume that you have returned to your regular daily activities without incident.  If any biopsies were taken you will be contacted by phone or by letter within the next 1-3 weeks.  Please call us at [phone] if you have not heard about the biopsies in 3 weeks.    SIGNATURES/CONFIDENTIALITY: You and/or your care partner have signed paperwork which will be entered into your electronic medical record.  These signatures attest to the fact that that the information above on your After Visit Summary has been reviewed and is understood.  Full responsibility of the confidentiality of this discharge information lies with you and/or your care-partner.   Thank you for letting us take care of your health care needs today.

## 2017-11-09 NOTE — Op Note (Signed)
[facility] Patient Name: Clinton Newton Procedure Date: 11/09/2017 9:26 AM MRN: [id number] Endoscopist: Ladene Artist , MD Age: 67 Referring MD:  Date of Birth: [birth year] Gender: Male Account #: [id number] Procedure:                Colonoscopy Indications:              Surveillance: Personal history of adenomatous                            polyps on last colonoscopy > 3 years ago Medicines:                Monitored Anesthesia Care Procedure:                Pre-Anesthesia Assessment:                           - Prior to the procedure, a History and Physical                            was performed, and patient medications and                            allergies were reviewed. The patient's tolerance of                            previous anesthesia was also reviewed. The risks                            and benefits of the procedure and the sedation                            options and risks were discussed with the patient.                            All questions were answered, and informed consent                            was obtained. Prior Anticoagulants: The patient has                            taken no previous anticoagulant or antiplatelet                            agents. ASA Grade Assessment: II - A patient with                            mild systemic disease. After reviewing the risks                            and benefits, the patient was deemed in                            satisfactory condition to undergo the procedure.  After obtaining informed consent, the colonoscope                            was passed under direct vision. Throughout the                            procedure, the patient's blood pressure, pulse, and                            oxygen saturations were monitored continuously. The                            Colonoscope was introduced through the anus and                            advanced to the the cecum,  identified by                            appendiceal orifice and ileocecal valve. The                            ileocecal valve, appendiceal orifice, and rectum                            were photographed. The quality of the bowel                            preparation was good. The colonoscopy was performed                            without difficulty. The patient tolerated the                            procedure well. Scope In: 9:33:49 AM Scope Out: 9:48:00 AM Scope Withdrawal Time: 0 hours 12 minutes 23 seconds  Total Procedure Duration: 0 hours 14 minutes 11 seconds  Findings:                 The perianal and digital rectal examinations were                            normal.                           Two sessile polyps were found in the transverse                            colon. The polyps were 5 to 6 mm in size. These                            polyps were removed with a cold biopsy forceps.                            Resection and retrieval were complete.  Multiple medium-mouthed diverticula were found in                            the left colon. There was narrowing of the colon in                            association with the diverticular opening. There                            was evidence of diverticular spasm. There was no                            evidence of diverticular bleeding.                           Internal hemorrhoids were found during                            retroflexion. The hemorrhoids were small and Grade                            I (internal hemorrhoids that do not prolapse).                           The exam was otherwise without abnormality on                            direct and retroflexion views. Complications:            No immediate complications. Estimated blood loss:                            None. Estimated Blood Loss:     Estimated blood loss: none. Impression:               - Two 5 to 6 mm polyps in the  transverse colon,                            removed with a cold biopsy forceps. Resected and                            retrieved.                           - Moderate diverticulosis in the left colon. There                            was narrowing of the colon in association with the                            diverticular opening. There was evidence of                            diverticular spasm. There was no evidence of  diverticular bleeding.                           - Internal hemorrhoids.                           - The examination was otherwise normal on direct                            and retroflexion views. Recommendation:           - Repeat colonoscopy in 5 years for surveillance.                           - Patient has a contact number available for                            emergencies. The signs and symptoms of potential                            delayed complications were discussed with the                            patient. Return to normal activities tomorrow.                            Written discharge instructions were provided to the                            patient.                           - High fiber diet.                           - Continue present medications.                           - Await pathology results. Ladene Artist, MD 11/09/2017 9:51:59 AM This report has been signed electronically.

## 2017-11-10 ENCOUNTER — Telehealth: Payer: Self-pay | Admitting: *Deleted

## 2017-11-10 ENCOUNTER — Telehealth: Payer: Self-pay

## 2017-11-10 NOTE — Telephone Encounter (Signed)
  Follow up Call-  Call back number 11/09/2017  Post procedure Call Back phone  # [phone]  Permission to leave phone message Yes  Some recent data might be hidden     Patient questions:  Do you have a fever, pain , or abdominal swelling? No. Pain Score  0 *  Have you tolerated food without any problems? Yes.    Have you been able to return to your normal activities? Yes.    Do you have any questions about your discharge instructions: Diet   No. Medications  No. Follow up visit  No.  Do you have questions or concerns about your Care? No.  Actions: * If pain score is 4 or above: No action needed, pain <4.

## 2017-11-10 NOTE — Telephone Encounter (Signed)
Attempted to reach patient for post-procedure f/u call. No answer. Left message that we will make another attempt to reach him later today and for him to please not hesitate to call us if he has any questions/concerns regarding his care. 

## 2017-11-16 ENCOUNTER — Encounter: Payer: Self-pay | Admitting: Gastroenterology

## 2017-11-24 DIAGNOSIS — D1801 Hemangioma of skin and subcutaneous tissue: Secondary | ICD-10-CM | POA: Diagnosis not present

## 2017-11-24 DIAGNOSIS — L814 Other melanin hyperpigmentation: Secondary | ICD-10-CM | POA: Diagnosis not present

## 2017-11-24 DIAGNOSIS — L570 Actinic keratosis: Secondary | ICD-10-CM | POA: Diagnosis not present

## 2017-11-24 DIAGNOSIS — L821 Other seborrheic keratosis: Secondary | ICD-10-CM | POA: Diagnosis not present

## 2017-11-24 DIAGNOSIS — D225 Melanocytic nevi of trunk: Secondary | ICD-10-CM | POA: Diagnosis not present

## 2017-11-24 DIAGNOSIS — D2372 Other benign neoplasm of skin of left lower limb, including hip: Secondary | ICD-10-CM | POA: Diagnosis not present

## 2017-11-24 DIAGNOSIS — D2272 Melanocytic nevi of left lower limb, including hip: Secondary | ICD-10-CM | POA: Diagnosis not present

## 2017-11-24 DIAGNOSIS — D2271 Melanocytic nevi of right lower limb, including hip: Secondary | ICD-10-CM | POA: Diagnosis not present

## 2017-11-24 DIAGNOSIS — D2262 Melanocytic nevi of left upper limb, including shoulder: Secondary | ICD-10-CM | POA: Diagnosis not present

## 2017-12-06 ENCOUNTER — Other Ambulatory Visit: Payer: Self-pay | Admitting: Family Medicine

## 2017-12-06 ENCOUNTER — Other Ambulatory Visit: Payer: Self-pay | Admitting: Physical Medicine & Rehabilitation

## 2017-12-07 MED ORDER — BISOPROLOL FUMARATE 5 MG PO TABS: 5.0000 mg | ORAL_TABLET | Freq: Every day | ORAL | 5 refills | Status: DC

## 2017-12-13 ENCOUNTER — Ambulatory Visit (HOSPITAL_BASED_OUTPATIENT_CLINIC_OR_DEPARTMENT_OTHER): Payer: Medicare Other | Admitting: Physical Medicine & Rehabilitation

## 2017-12-13 ENCOUNTER — Encounter: Payer: Self-pay | Admitting: Physical Medicine & Rehabilitation

## 2017-12-13 ENCOUNTER — Encounter: Payer: Medicare Other | Attending: Physical Medicine & Rehabilitation

## 2017-12-13 VITALS — BP 155/95 | HR 73

## 2017-12-13 DIAGNOSIS — K219 Gastro-esophageal reflux disease without esophagitis: Secondary | ICD-10-CM | POA: Diagnosis not present

## 2017-12-13 DIAGNOSIS — I10 Essential (primary) hypertension: Secondary | ICD-10-CM | POA: Diagnosis not present

## 2017-12-13 DIAGNOSIS — Z79899 Other long term (current) drug therapy: Secondary | ICD-10-CM | POA: Diagnosis not present

## 2017-12-13 DIAGNOSIS — M1990 Unspecified osteoarthritis, unspecified site: Secondary | ICD-10-CM | POA: Insufficient documentation

## 2017-12-13 DIAGNOSIS — E785 Hyperlipidemia, unspecified: Secondary | ICD-10-CM | POA: Insufficient documentation

## 2017-12-13 DIAGNOSIS — M545 Low back pain: Secondary | ICD-10-CM | POA: Insufficient documentation

## 2017-12-13 DIAGNOSIS — Z86010 Personal history of colonic polyps: Secondary | ICD-10-CM | POA: Insufficient documentation

## 2017-12-13 DIAGNOSIS — G8929 Other chronic pain: Secondary | ICD-10-CM | POA: Insufficient documentation

## 2017-12-13 DIAGNOSIS — M47816 Spondylosis without myelopathy or radiculopathy, lumbar region: Secondary | ICD-10-CM

## 2017-12-13 DIAGNOSIS — Z8249 Family history of ischemic heart disease and other diseases of the circulatory system: Secondary | ICD-10-CM | POA: Diagnosis not present

## 2017-12-13 MED ORDER — METHOCARBAMOL 500 MG PO TABS: 500.0000 mg | ORAL_TABLET | Freq: Every evening | ORAL | 1 refills | Status: DC | PRN

## 2017-12-13 NOTE — Patient Instructions (Signed)
Please call for appt if pain worsens

## 2017-12-13 NOTE — Progress Notes (Signed)
Subjective:    Patient ID: Clinton Newton, male    DOB: 09-15-1951, 67 y.o.   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 General activity 5 Relation with others 3 Enjoyment of life 3 What TIME of day is your pain at its worst? daytime Sleep (in general) Good  Pain is worse with: bending Pain improves with: medication Relief from Meds: 4  Mobility walk without assistance ability to climb steps?  yes do you drive?  yes  Function not employed: date last employed 2014  Neuro/Psych No problems in this area  Prior Studies Any changes since last visit?  no  Physicians involved in your care Any changes since last visit?  no   Family History  Problem Relation Age of Onset  . Heart disease Father   . Colon cancer Neg Hx   . Pancreatic cancer Neg Hx   . Rectal cancer Neg Hx   . Stomach cancer Neg Hx   . Esophageal cancer Neg Hx    Social History   Socioeconomic History  . Marital status: Married    Spouse name: Not on file  . Number of children: 0  . Years of education: Not on file  . Highest education level: Not on file  Social Needs  . Financial resource strain: Not on file  . Food insecurity - worry: Not on file  . Food insecurity - inability: Not on file  . Transportation needs - medical: Not on file  . Transportation needs - non-medical: Not on file  Occupational History  . Occupation: Retired  Tobacco Use  . Smoking status: Never Smoker  . Smokeless tobacco: Never Used  Substance and Sexual Activity  . Alcohol use: Yes    Alcohol/week: 4.2 oz    Types: 7 Glasses of wine per week  .  Drug use: No  . Sexual activity: Yes  Other Topics Concern  . 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 APPENDECTOMY    . COLONOSCOPY    . POLYPECTOMY    . PRE-MALIGNANT / BENIGN SKIN LESION EXCISION     Past Medical History:  Diagnosis Date  . Arthritis    L3-L4  . Cataract   . Colon polyps   . Diverticulitis   . GERD (gastroesophageal reflux disease)   . Hyperlipidemia   . Hypertension    There were no vitals taken for this visit.  Opioid Risk Score:   Fall Risk Score:  `1  Depression screen PHQ 2/9  Depression screen [facility] 2/9 11/20/2016 10/29/2016  Decreased Interest 0 0  Down, Depressed, Hopeless 0 0  PHQ - 2 Score 0 0  Altered sleeping 0 -  Tired,  decreased energy 0 -  Change in appetite 0 -  Feeling bad or failure about yourself  0 -  Trouble concentrating 0 -  Moving slowly or fidgety/restless 0 -  Suicidal thoughts 0 -  PHQ-9 Score 0 -  Difficult doing work/chores Not difficult at all -     Review of Systems  Constitutional: Negative.   HENT: Negative.   Eyes: Negative.   Respiratory: Negative.   Cardiovascular: Negative.   Gastrointestinal: Negative.   Endocrine: Negative.   Genitourinary: Negative.   Musculoskeletal: Negative.   Skin: Negative.   Allergic/Immunologic: Negative.   Neurological: Negative.   Hematological: Negative.   Psychiatric/Behavioral: Negative.   All other systems reviewed and are negative.      Objective:   Physical Exam  Constitutional: He is oriented to person, place, and time. He appears well-developed and well-nourished. No distress.  HENT:  Head: Normocephalic and atraumatic.  Eyes: Conjunctivae and EOM are normal.  Pupils are equal, round, and reactive to light.  Neck: Normal range of motion.  Musculoskeletal:  Lumbar range of motion full flexion of the lumbar spine without pain, extension is 75% accompanied by pain at end range.  Neurological: He is alert and oriented to person, place, and time. He displays no atrophy and no tremor. He exhibits normal muscle tone. Coordination normal.  Motor strength is 5/5 bilateral hip flexor knee extensor ankle dorsiflexor Negative straight leg raise  Skin: He is not diaphoretic.  Psychiatric: He has a normal mood and affect. His behavior is normal. Judgment and thought content normal.  Nursing note and vitals reviewed.         Assessment & Plan:  1.  Right lumbar facet syndrome pain mainly with extension.  Does have lumbar facet degeneration at that level.  Fortunately with his exercise program his symptoms are minimal. No need for interventional spine procedures Only using as needed Robaxin on a very infrequent basis.  We will give him another prescription Physical medicine follow-up on as-needed basis call to schedule.

## 2017-12-22 ENCOUNTER — Encounter: Payer: Self-pay | Admitting: Family Medicine

## 2017-12-23 ENCOUNTER — Other Ambulatory Visit: Payer: Self-pay | Admitting: *Deleted

## 2017-12-23 MED ORDER — ROSUVASTATIN CALCIUM 20 MG PO TABS: 20.0000 mg | ORAL_TABLET | Freq: Every day | ORAL | 1 refills | Status: DC

## 2017-12-23 MED ORDER — AMLODIPINE BESYLATE 5 MG PO TABS: 5.0000 mg | ORAL_TABLET | Freq: Every day | ORAL | 1 refills | Status: DC

## 2017-12-23 NOTE — Telephone Encounter (Signed)
Rx done and pt informed via Mychart message. 

## 2017-12-29 DIAGNOSIS — H25813 Combined forms of age-related cataract, bilateral: Secondary | ICD-10-CM | POA: Diagnosis not present

## 2017-12-29 DIAGNOSIS — D3132 Benign neoplasm of left choroid: Secondary | ICD-10-CM | POA: Diagnosis not present

## 2017-12-29 DIAGNOSIS — H52203 Unspecified astigmatism, bilateral: Secondary | ICD-10-CM | POA: Diagnosis not present

## 2018-01-11 ENCOUNTER — Encounter: Payer: Self-pay | Admitting: Family Medicine

## 2018-01-11 ENCOUNTER — Ambulatory Visit (INDEPENDENT_AMBULATORY_CARE_PROVIDER_SITE_OTHER): Payer: Medicare Other | Admitting: Family Medicine

## 2018-01-11 VITALS — BP 130/80 | HR 70 | Temp 98.0°F | Ht 68.0 in | Wt 178.7 lb

## 2018-01-11 DIAGNOSIS — I10 Essential (primary) hypertension: Secondary | ICD-10-CM | POA: Diagnosis not present

## 2018-01-11 DIAGNOSIS — E785 Hyperlipidemia, unspecified: Secondary | ICD-10-CM

## 2018-01-11 DIAGNOSIS — M545 Low back pain, unspecified: Secondary | ICD-10-CM

## 2018-01-11 DIAGNOSIS — Z23 Encounter for immunization: Secondary | ICD-10-CM | POA: Diagnosis not present

## 2018-01-11 MED ORDER — TIZANIDINE HCL 2 MG PO TABS: 2.0000 mg | ORAL_TABLET | Freq: Three times a day (TID) | ORAL | 0 refills | Status: DC | PRN

## 2018-01-11 NOTE — Addendum Note (Signed)
Addended by: Agnes Lawrence on: 01/11/2018 03:19 PM   Modules accepted: Orders

## 2018-01-11 NOTE — Patient Instructions (Signed)
BEFORE YOU LEAVE: -prevnar 13 -follow up: Annual Exam with Dr. Maudie Mercury in the next 3 months, labs then  Call Dr. Letta Pate office about the back.  In the interim try heat several times daily, tiger balm, aleve or tylenol per instructions and I sent in some tizanidine to try for a muscle relaxer. Do not take the muscle relaxer with any sedation medications or substances and only use as instructed.

## 2018-01-11 NOTE — Progress Notes (Signed)
HPI:  Using dictation device. Unfortunately this device frequently misinterprets words/phrases.  Clinton Newton is a pleasant 67 y.o. here for follow up. Chronic medical problems summarized below were reviewed for changes.  He was having some hand pain at his last visit, saw sports medicine about this.  Today reports he was doing some art work over the weekend and reinjured his back.  He struggles with chronic intermittent low back pain and sees Dr. Letta Pate for this.  He tried to get some of his Robaxin, as this usually helps, but his insurance is no longer going to cover this.  This works some to help his back pain.  He also uses Tylenol and Aleve which helps a little.  This pain is bilateral, in the low back, severe at times.  It does not radiate.  Denies weakness, numbness, bowel or bladder dysfunction.  He is due for his labs, but is not fasting today.  He is agreeable to doing his pneumonia vaccine.  He plans to call Dr. Letta Pate office for follow-up. Due for labs, pneumonia vaccine, Annual exam/welcome to Medicare exam at next visit  Hypertension: -Medications include aspirin, amlodipine, bisoprolol  Hyperlipidemia: -Medications: Rosuvastatin  Low B12: Advised supplementation  Insomnia: -I advised against sleep aids in the past as did not feel benefits > risks, he did not agree with me on this -He likes to take Sonata when he travels-because he has poor sleep and then is in a bad mood the next day -He has been well informed of the risks  Degenerative disc disease, low back pain: -He is a Curator -Uses NSAIDs and muscle relaxers from time to time -MRI in 2017 showed right facet arthritis at L3-L4, chronic soft disc protrusion at L4-L5 with possible nerve root compression, he was referred to a specialist -Dr. Letta Pate  ROS: See pertinent positives and negatives per HPI.  Past Medical History:  Diagnosis Date  . Arthritis    L3-L4  . Cataract   . Colon polyps   .  Diverticulitis   . GERD (gastroesophageal reflux disease)   . Hyperlipidemia   . Hypertension     Past Surgical History:  Procedure Laterality Date  . APPENDECTOMY    . COLONOSCOPY    . POLYPECTOMY    . PRE-MALIGNANT / BENIGN SKIN LESION EXCISION      Family History  Problem Relation Age of Onset  . Heart disease Father   . Colon cancer Neg Hx   . Pancreatic cancer Neg Hx   . Rectal cancer Neg Hx   . Stomach cancer Neg Hx   . Esophageal cancer Neg Hx     SOCIAL HX: See HPI   Current Outpatient Medications:  .  amLODipine (NORVASC) 5 MG tablet, Take 1 tablet (5 mg total) by mouth daily., Disp: 90 tablet, Rfl: 1 .  aspirin 81 MG EC tablet, Take 81 mg by mouth daily.  , Disp: , Rfl:  .  bisoprolol (ZEBETA) 5 MG tablet, Take 1 tablet (5 mg total) by mouth daily., Disp: 30 tablet, Rfl: 5 .  cholecalciferol (VITAMIN D) 1000 UNITS tablet, Take 1,000 Units by mouth daily., Disp: , Rfl:  .  ciprofloxacin (CIPRO) 500 MG tablet, Take 1 tablet (500 mg total) 2 (two) times daily by mouth. Please schedule an office visit, Disp: 20 tablet, Rfl: 0 .  Flaxseed, Linseed, (FLAXSEED OIL PO), Take by mouth as directed., Disp: , Rfl:  .  MAGNESIUM PO, Take by mouth daily., Disp: , Rfl:  .  metroNIDAZOLE (FLAGYL) 500 MG tablet, Take 1 tablet (500 mg total) 3 (three) times daily by mouth. Please schedule an office visit, Disp: 30 tablet, Rfl: 0 .  Multiple Vitamin (MULTIVITAMIN) tablet, Take 1 tablet by mouth daily.  , Disp: , Rfl:  .  Omega-3 Fatty Acids (FISH OIL PO), Take by mouth daily., Disp: , Rfl:  .  Probiotic Product (PROBIOTIC PO), Take by mouth daily., Disp: , Rfl:  .  rosuvastatin (CRESTOR) 20 MG tablet, Take 1 tablet (20 mg total) by mouth daily., Disp: 90 tablet, Rfl: 1  Current Facility-Administered Medications:  .  0.9 %  sodium chloride infusion, 500 mL, Intravenous, Once, Ladene Artist, MD  EXAM:  Vitals:   01/11/18 1418  BP: 130/80  Pulse: 70  Temp: 98 F (36.7 C)     Body mass index is 27.17 kg/m.  GENERAL: vitals reviewed and listed above, alert, oriented, appears well hydrated and in no acute distress  HEENT: atraumatic, conjunttiva clear, no obvious abnormalities on inspection of external nose and ears  NECK: no obvious masses on inspection  LUNGS: clear to auscultation bilaterally, no wheezes, rales or rhonchi, good air movement  CV: HRRR, no peripheral edema  MS: moves all extremities without noticeable abnormality, TTP bilar lumbar paraspinal musculature, no bony TTP, stt/strength and dtrs equal and normal bilat in lower extremities, normal gair  PSYCH: pleasant and cooperative, no obvious depression or anxiety  ASSESSMENT AND PLAN:  Discussed the following assessment and plan:  Acute bilateral low back pain without sciatica  Essential hypertension  Hyperlipidemia, unspecified hyperlipidemia type  -He plans to follow-up with his specialist about the back pain, in the interim since some Zanaflex to try (risks and proper use discussed), and advised topical menthol, heat, stretching and Aleve or Tylenol per instructions as needed -due for labs and bp up a little today - likely from current back issues/aleve -recheck bp and fasting labs at [facility] in 1-3 months - advised to schedule -prevnar 13 today -Patient advised to return or notify a doctor immediately if symptoms worsen or new concerns arise.  Patient Instructions  BEFORE YOU LEAVE: -prevnar 13 -follow up: Annual Exam with Dr. Maudie Mercury in the next 3 months, labs then  Call Dr. Letta Pate office about the back.  In the interim try heat several times daily, tiger balm, aleve or tylenol per instructions and I sent in some tizanidine to try for a muscle relaxer. Do not take the muscle relaxer with any sedation medications or substances and only use as instructed.     Lucretia Kern, DO

## 2018-01-15 ENCOUNTER — Encounter: Payer: Self-pay | Admitting: Family Medicine

## 2018-01-17 MED ORDER — BISOPROLOL FUMARATE 5 MG PO TABS: 5.0000 mg | ORAL_TABLET | Freq: Every day | ORAL | 1 refills | Status: DC

## 2018-01-20 ENCOUNTER — Encounter: Payer: Medicare Other | Attending: Physical Medicine & Rehabilitation

## 2018-01-20 ENCOUNTER — Ambulatory Visit (HOSPITAL_BASED_OUTPATIENT_CLINIC_OR_DEPARTMENT_OTHER): Payer: Medicare Other | Admitting: Physical Medicine & Rehabilitation

## 2018-01-20 ENCOUNTER — Encounter: Payer: Self-pay | Admitting: Physical Medicine & Rehabilitation

## 2018-01-20 VITALS — BP 153/88 | HR 63 | Resp 14 | Ht 64.0 in | Wt 182.0 lb

## 2018-01-20 DIAGNOSIS — K219 Gastro-esophageal reflux disease without esophagitis: Secondary | ICD-10-CM | POA: Diagnosis not present

## 2018-01-20 DIAGNOSIS — G8929 Other chronic pain: Secondary | ICD-10-CM | POA: Insufficient documentation

## 2018-01-20 DIAGNOSIS — M1990 Unspecified osteoarthritis, unspecified site: Secondary | ICD-10-CM | POA: Insufficient documentation

## 2018-01-20 DIAGNOSIS — E785 Hyperlipidemia, unspecified: Secondary | ICD-10-CM | POA: Insufficient documentation

## 2018-01-20 DIAGNOSIS — M545 Low back pain: Secondary | ICD-10-CM | POA: Insufficient documentation

## 2018-01-20 DIAGNOSIS — M47816 Spondylosis without myelopathy or radiculopathy, lumbar region: Secondary | ICD-10-CM

## 2018-01-20 DIAGNOSIS — Z86010 Personal history of colonic polyps: Secondary | ICD-10-CM | POA: Insufficient documentation

## 2018-01-20 DIAGNOSIS — M5136 Other intervertebral disc degeneration, lumbar region: Secondary | ICD-10-CM

## 2018-01-20 DIAGNOSIS — I10 Essential (primary) hypertension: Secondary | ICD-10-CM | POA: Diagnosis not present

## 2018-01-20 DIAGNOSIS — Z8249 Family history of ischemic heart disease and other diseases of the circulatory system: Secondary | ICD-10-CM | POA: Diagnosis not present

## 2018-01-20 DIAGNOSIS — Z79899 Other long term (current) drug therapy: Secondary | ICD-10-CM | POA: Diagnosis not present

## 2018-01-20 NOTE — Patient Instructions (Signed)
Hand and knees planks progress to Hands and toes 3 sets of 1 min goal  Start at 15-30secs  Check You tube for varaitions  Try tizanidine and let me know how it works

## 2018-01-20 NOTE — Progress Notes (Signed)
Subjective:    Patient ID: Clinton Newton, male    DOB: 1951-04-17, 67 y.o.   MRN: [id number]  HPI  Low back pain now includes left side as well as right side Insurance no longer covers methocarbamol  Flexeril tried in past not  Effective  Did yoga class and artwork one day and had severe pain.  Took about 4 methocarbamol that day Down to 1 tablet per day.  No falls or trauma history recently   Pain Inventory Average Pain 2 Pain Right Now 1 My pain is dull and stabbing  In the last 24 hours, has pain interfered with the following? General activity 3 Relation with others 2 Enjoyment of life 3 What TIME of day is your pain at its worst? daytime Sleep (in general) Good  Pain is worse with: bending and standing Pain improves with: medication Relief from Meds: 7  Mobility walk without assistance how many minutes can you walk? unlimited ability to climb steps?  yes do you drive?  yes  Function retired Do you have any goals in this area?  no  Neuro/Psych spasms  Prior Studies Any changes since last visit?  no  Physicians involved in your care Any changes since last visit?  no   Family History  Problem Relation Age of Onset  . Heart disease Father   . Colon cancer Neg Hx   . Pancreatic cancer Neg Hx   . Rectal cancer Neg Hx   . Stomach cancer Neg Hx   . Esophageal cancer Neg Hx    Social History   Socioeconomic History  . Marital status: Married    Spouse name: Not on file  . Number of children: 0  . Years of education: Not on file  . Highest education level: Not on file  Occupational History  . Occupation: Retired  Scientific laboratory technician  . Financial resource strain: Not on file  . Food insecurity:    Worry: Not on file    Inability: Not on file  . Transportation needs:    Medical: Not on file    Non-medical: Not on file  Tobacco Use  . Smoking status: Never Smoker  . Smokeless tobacco: Never Used  Substance and Sexual Activity  . Alcohol use: Yes    Alcohol/week: 4.2 oz    Types: 7 Glasses of wine per week  . Drug use: No  . Sexual activity: Yes  Lifestyle  . Physical activity:    Days per week: Not on file    Minutes per session: Not on file  . Stress: Not on file  Relationships  . Social connections:    Talks on phone: Not on file    Gets together: Not on file    Attends religious service: Not on file    Active member of club or organization: Not on file    Attends meetings of clubs or organizations: Not on file    Relationship status: Not on file  Other Topics Concern  . 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 APPENDECTOMY    . COLONOSCOPY    .  POLYPECTOMY    . PRE-MALIGNANT / BENIGN SKIN LESION EXCISION     Past Medical History:  Diagnosis Date  . Arthritis    L3-L4  . Cataract   . Colon polyps   . Diverticulitis   . GERD (gastroesophageal reflux disease)   . Hyperlipidemia   . Hypertension    BP (!) 153/88 (BP Location: Right Arm, Patient Position: Sitting, Cuff Size: Normal)   Pulse 63   Resp 14   Ht 5\' 4"  (1.626 m)   Wt 182 lb (82.6 kg)   SpO2 96%   BMI 31.24 kg/m   Opioid Risk Score:   Fall Risk Score:  `1  Depression screen PHQ 2/9  Depression screen [facility] 2/9 11/20/2016 10/29/2016  Decreased Interest 0 0  Down, Depressed, Hopeless 0 0  PHQ - 2 Score 0 0  Altered sleeping 0 -  Tired, decreased energy 0 -  Change in appetite 0 -  Feeling bad or failure about yourself  0 -  Trouble concentrating 0 -  Moving slowly or fidgety/restless 0 -  Suicidal thoughts 0 -  PHQ-9 Score 0 -  Difficult doing work/chores Not difficult at all -    Review of Systems  Constitutional: Negative.   HENT:  Negative.   Eyes: Negative.   Respiratory: Negative.   Gastrointestinal: Negative.   Endocrine: Negative.   Genitourinary: Negative.   Musculoskeletal: Positive for back pain.       Spasms   Allergic/Immunologic: Negative.   Neurological: Negative.   Hematological: Negative.   Psychiatric/Behavioral: Negative.   All other systems reviewed and are negative.      Objective:   Physical Exam  Constitutional: He is oriented to person, place, and time. He appears well-developed and well-nourished. No distress.  HENT:  Head: Normocephalic and atraumatic.  Eyes: Pupils are equal, round, and reactive to light. Conjunctivae and EOM are normal.  Neck: Normal range of motion.  Musculoskeletal:       Lumbar back: He exhibits normal range of motion, no tenderness and no deformity.  Pain with lumbar extension but not with lumbar flexion  Neurological: He is alert and oriented to person, place, and time. No cranial nerve deficit. He exhibits normal muscle tone. Coordination and gait normal.  Reflex Scores:      Patellar reflexes are 2+ on the right side and 2+ on the left side.      Achilles reflexes are 2+ on the right side and 2+ on the left side. Motor strength is 5/5 bilateral deltoid bicep tricep grip hip flexor knee extensor ankle dorsiflexor  Skin: He is not diaphoretic.  Psychiatric: He has a normal mood and affect.  Nursing note and vitals reviewed.         Assessment & Plan:  #1.  Chronic low back pain his pain was more right-sided now more left-sided.  We discussed that his imaging studies show bilateral lumbar spondylosis.  He is likely aggravated his arthritis on the left side based on activities that he has performed.  Patient has received a muscle relaxer from his primary care physician.  He has not filled the prescription for tizanidine.  I have encouraged him to do so and trial this medication using it on a as needed basis much as he has used methocarbamol in the past.   If this is helpful we can call up another prescription.  He mainly uses muscle relaxers on an as-needed basis with flareups. He gets good relief with methocarbamol but not with  cyclobenzaprine.  He has not tried tizanidine or baclofen.  We discussed that tizanidine and baclofen are used mainly for spasticity rather than for spasms Return as needed basis  We discussed exercise program recommend core strengthening using planks initially starting on hands and knees or elbows and knees.  Instruct patient to start out with holding the position 15-30 seconds work up to 1 minute.  Once he is able to do 1 minute he can do sets resting a minute in between sets

## 2018-04-06 ENCOUNTER — Encounter: Payer: Self-pay | Admitting: Family Medicine

## 2018-04-07 ENCOUNTER — Encounter: Payer: Self-pay | Admitting: Physical Medicine & Rehabilitation

## 2018-04-08 ENCOUNTER — Other Ambulatory Visit: Payer: Self-pay

## 2018-04-08 MED ORDER — METHOCARBAMOL 500 MG PO TABS: 500.0000 mg | ORAL_TABLET | Freq: Every day | ORAL | 2 refills | Status: DC

## 2018-04-08 NOTE — Telephone Encounter (Signed)
Ordered per doctors orders.

## 2018-04-14 ENCOUNTER — Ambulatory Visit (INDEPENDENT_AMBULATORY_CARE_PROVIDER_SITE_OTHER): Payer: Medicare Other | Admitting: Family Medicine

## 2018-04-14 ENCOUNTER — Encounter: Payer: Self-pay | Admitting: Family Medicine

## 2018-04-14 VITALS — BP 128/70 | HR 83 | Temp 98.5°F | Ht 67.75 in | Wt 174.9 lb

## 2018-04-14 DIAGNOSIS — M5136 Other intervertebral disc degeneration, lumbar region: Secondary | ICD-10-CM | POA: Diagnosis not present

## 2018-04-14 DIAGNOSIS — E785 Hyperlipidemia, unspecified: Secondary | ICD-10-CM | POA: Diagnosis not present

## 2018-04-14 DIAGNOSIS — M51369 Other intervertebral disc degeneration, lumbar region without mention of lumbar back pain or lower extremity pain: Secondary | ICD-10-CM

## 2018-04-14 DIAGNOSIS — I10 Essential (primary) hypertension: Secondary | ICD-10-CM | POA: Diagnosis not present

## 2018-04-14 DIAGNOSIS — D72819 Decreased white blood cell count, unspecified: Secondary | ICD-10-CM

## 2018-04-14 DIAGNOSIS — Z0000 Encounter for general adult medical examination without abnormal findings: Secondary | ICD-10-CM | POA: Diagnosis not present

## 2018-04-14 LAB — BASIC METABOLIC PANEL
BUN: 13 mg/dL (ref 6–23)
CALCIUM: 9.8 mg/dL (ref 8.4–10.5)
CO2: 32 mEq/L (ref 19–32)
CREATININE: 0.71 mg/dL (ref 0.40–1.50)
Chloride: 103 mEq/L (ref 96–112)
GFR: 118.16 mL/min (ref >60.00)
Glucose, Bld: 121 mg/dL — ABNORMAL HIGH (ref 70–99)
POTASSIUM: 4.1 meq/L (ref 3.5–5.1)
Sodium: 144 mEq/L (ref 135–145)

## 2018-04-14 LAB — CHOLESTEROL, TOTAL: Cholesterol: 180 mg/dL (ref 0–200)

## 2018-04-14 LAB — CBC
HCT: 48.1 % (ref 39.0–52.0)
Hemoglobin: 16.9 g/dL (ref 13.0–17.0)
MCHC: 35.1 g/dL (ref 30.0–36.0)
MCV: 95.1 fl (ref 78.0–100.0)
Platelets: 179 10*3/uL (ref 150.0–400.0)
RBC: 5.05 Mil/uL (ref 4.22–5.81)
RDW: 12.9 % (ref 11.5–15.5)
WBC: 3.7 10*3/uL — AB (ref 4.0–10.5)

## 2018-04-14 LAB — HDL CHOLESTEROL: HDL: 74 mg/dL (ref >39.00)

## 2018-04-14 NOTE — Patient Instructions (Signed)
BEFORE YOU LEAVE: -labs -follow up: 4 months   Clinton Newton , Thank you for taking time to come for your Medicare Wellness Visit. I appreciate your ongoing commitment to your health goals. Please review the following plan we discussed and let me know if I can assist you in the future.   These are the goals we discussed: Goals    Check into the cost of the new shingles vaccine (Shinrix) with your insurance and let us know if you would like to do this. Eat a healthy low sugar diet and continue to get regular aerobic exercise      This is a list of the screening recommended for you and due dates:  Health Maintenance  Topic Date Due  . HIV Screening  02/27/2025*  . Flu Shot  05/26/2018  . Pneumonia vaccines (2 of 2 - PPSV23) 01/12/2019  . Colon Cancer Screening  11/09/2022  . Tetanus Vaccine  09/10/2027  .  Hepatitis C: One time screening is recommended by Center for Disease Control  (CDC) for  adults born from [birth year] through 1964.   Completed  *Topic was postponed. The date shown is not the original due date.   We have ordered labs or studies at this visit. It can take up to 1-2 weeks for results and processing. IF results require follow up or explanation, we will call you with instructions. Clinically stable results will be released to your [facility]. If you have not heard from [place] or cannot find your results in [facility] in 2 weeks please contact our office at [phone].  If you are not yet signed up for [facility], please consider signing up.      Preventive Care 17 Years and Older, Male Preventive care refers to lifestyle choices and visits with your health care provider that can promote health and wellness. What does preventive care include?  A yearly physical exam. This is also called an annual well check.  Dental exams once or twice a year.  Routine eye exams. Ask your health care provider how often you should have your eyes checked.  Personal lifestyle choices,  including: ? Daily care of your teeth and gums. ? Regular physical activity. ? Eating a healthy diet. ? Avoiding tobacco and drug use. ? Limiting alcohol use. ? Practicing safe sex. ? Taking vitamin and mineral supplements as recommended by your health care provider. What happens during an annual well check? The services and screenings done by your health care provider during your annual well check will depend on your age, overall health, lifestyle risk factors, and family history of disease. Counseling Your health care provider may ask you questions about your:  Alcohol use.  Tobacco use.  Drug use.  Emotional well-being.  Home and relationship well-being.  Sexual activity.  Eating habits.  History of falls.  Memory and ability to understand (cognition).  Work and work Statistician.  Screening You may have the following tests or measurements:  Height, weight, and BMI.  Blood pressure.  Lipid and cholesterol levels. These may be checked every 5 years, or more frequently if you are over 1 years old.  Skin check.  Lung cancer screening. You may have this screening every year starting at age 53 if you have a 30-pack-year history of smoking and currently smoke or have quit within the past 15 years.  Flexible sigmoidoscopy or colonoscopy. You may have a sigmoidoscopy every 5 years or a colonoscopy every 10 years starting at age 37.  Prostate cancer screening. Recommendations  will vary depending on your family history and other risks.  Hepatitis C blood test.  Hepatitis B blood test.  Sexually transmitted disease (STD) testing.  Diabetes screening. This is done by checking your blood sugar (glucose) after you have not eaten for a while (fasting). You may have this done every 1-3 years.  Abdominal aortic aneurysm (AAA) screening. You may need this if you are a current or former smoker.  Osteoporosis. You may be screened starting at age 50 if you are at high  risk.  Talk with your health care provider about your test results, treatment options, and if necessary, the need for more tests. Vaccines Your health care provider may recommend certain vaccines, such as:  Influenza vaccine. This is recommended every year.  Tetanus, diphtheria, and acellular pertussis (Tdap, Td) vaccine. You may need a Td booster every 10 years.  Varicella vaccine. You may need this if you have not been vaccinated.  Zoster vaccine. You may need this after age 43.  Measles, mumps, and rubella (MMR) vaccine. You may need at least one dose of MMR if you were born in 1957 or later. You may also need a second dose.  Pneumococcal 13-valent conjugate (PCV13) vaccine. One dose is recommended after age 40.  Pneumococcal polysaccharide (PPSV23) vaccine. One dose is recommended after age 68.  Meningococcal vaccine. You may need this if you have certain conditions.  Hepatitis A vaccine. You may need this if you have certain conditions or if you travel or work in places where you may be exposed to hepatitis A.  Hepatitis B vaccine. You may need this if you have certain conditions or if you travel or work in places where you may be exposed to hepatitis B.  Haemophilus influenzae type b (Hib) vaccine. You may need this if you have certain risk factors.  Talk to your health care provider about which screenings and vaccines you need and how often you need them. This information is not intended to replace advice given to you by your health care provider. Make sure you discuss any questions you have with your health care provider. Document Released: 11/08/2015 Document Revised: 07/01/2016 Document Reviewed: 08/13/2015 Elsevier Interactive Patient Education  [employer].

## 2018-04-14 NOTE — Progress Notes (Signed)
Medicare Annual Preventive Care Visit  (initial annual wellness or annual wellness exam)  Concerns and/or follow up today:  Clinton Newton is a pleasant 67 y.o. here for follow up. Chronic medical problems summarized below were reviewed for changes and stability and were updated as needed below. These issues and their treatment remain stable for the most part. Reports is eating a lot more fiber and is trying to eat healthy. Is getting regular exercise. Denies CP, SOB, DOE, treatment intolerance or new symptoms.  Hypertension: -Medications include aspirin, amlodipine, bisoprolol  Hyperlipidemia: -Medications: Rosuvastatin  Low B12: Advised supplementation  Insomnia: -I advised against sleep aids in the past as did not feel benefits > risks -He likes to take Sonata when he travels-because he has poor sleep and then is in a bad mood the next day - he was insistent about this despite risks  Degenerative disc disease, low back pain: -He is a Curator -Uses NSAIDs and muscle relaxers from time to time -tizanidine made him have dry mouth and made him sleepy -he prefers robaxin and uses prn -MRI in 2017 showed right facet arthritis at L3-L4, chronic soft disc protrusion at L4-L5 with possible nerve root compression, he was referred to a specialist -Dr. Letta Pate   See HM section in Epic for other details of completed HM. See scanned documentation under Media Tab for further documentation HPI, health risk assessment. See Media Tab and Care Teams sections in Epic for other providers.  ROS: negative for report of fevers, unintentional weight loss, vision changes, vision loss, hearing loss or change, chest pain, sob, hemoptysis, melena, hematochezia, hematuria, genital discharge or lesions, falls, bleeding or bruising, loc, thoughts of suicide or self harm, memory loss  1.) Patient-completed health risk assessment  - completed and reviewed, see scanned documentation  2.) Review of Medical  History: -PMH, PSH, Family History and current specialty and care providers reviewed and updated and listed below  - see scanned in document in chart and below  Past Medical History:  Diagnosis Date  . Arthritis    L3-L4  . Cataract   . Colon polyps   . Diverticulitis   . GERD (gastroesophageal reflux disease)   . Hyperlipidemia   . Hypertension     Past Surgical History:  Procedure Laterality Date  . APPENDECTOMY    . COLONOSCOPY    . POLYPECTOMY    . PRE-MALIGNANT / BENIGN SKIN LESION EXCISION      Social History   Socioeconomic History  . Marital status: Married    Spouse name: Not on file  . Number of children: 0  . Years of education: Not on file  . Highest education level: Not on file  Occupational History  . Occupation: Retired  Scientific laboratory technician  . Financial resource strain: Not on file  . Food insecurity:    Worry: Not on file    Inability: Not on file  . Transportation needs:    Medical: Not on file    Non-medical: Not on file  Tobacco Use  . Smoking status: Never Smoker  . Smokeless tobacco: Never Used  Substance and Sexual Activity  . Alcohol use: Yes    Alcohol/week: 4.2 oz    Types: 7 Glasses of wine per week  . Drug use: No  . Sexual activity: Yes  Lifestyle  . Physical activity:    Days per week: Not on file    Minutes per session: Not on file  . Stress: Not on file  Relationships  . Social  connections:    Talks on phone: Not on file    Gets together: Not on file    Attends religious service: Not on file    Active member of club or organization: Not on file    Attends meetings of clubs or organizations: Not on file    Relationship status: Not on file  . Intimate partner violence:    Fear of current or ex partner: Not on file    Emotionally abused: Not on file    Physically abused: Not on file    Forced sexual activity: Not on file  Other Topics Concern  . Not on file  Social History Narrative   Work or School: retired from Therapist, sports by trade, does art - glass, painting, metal, body painting      Home Situation: lives with wife      Spiritual Beliefs:       Lifestyle: working out on a regular basis; diet is healthy          Family History  Problem Relation Age of Onset  . Heart disease Father   . Colon cancer Neg Hx   . Pancreatic cancer Neg Hx   . Rectal cancer Neg Hx   . Stomach cancer Neg Hx   . Esophageal cancer Neg Hx     Current Outpatient Medications on File Prior to Visit  Medication Sig Dispense Refill  . amLODipine (NORVASC) 5 MG tablet Take 1 tablet (5 mg total) by mouth daily. 90 tablet 1  . aspirin 81 MG EC tablet Take 81 mg by mouth daily.      . bisoprolol (ZEBETA) 5 MG tablet Take 1 tablet (5 mg total) by mouth daily. 90 tablet 1  . cholecalciferol (VITAMIN D) 1000 UNITS tablet Take 1,000 Units by mouth daily.    . Flaxseed, Linseed, (FLAXSEED OIL PO) Take by mouth as directed.    Marland Kitchen MAGNESIUM PO Take by mouth daily.    . methocarbamol (ROBAXIN) 500 MG tablet Take 1 tablet (500 mg total) by mouth daily. 30 tablet 2  . Multiple Vitamin (MULTIVITAMIN) tablet Take 1 tablet by mouth daily.      . Omega-3 Fatty Acids (FISH OIL PO) Take by mouth daily.    . Probiotic Product (PROBIOTIC PO) Take by mouth daily.    . rosuvastatin (CRESTOR) 20 MG tablet Take 1 tablet (20 mg total) by mouth daily. 90 tablet 1   Current Facility-Administered Medications on File Prior to Visit  Medication Dose Route Frequency Provider Last Rate Last Dose  . 0.9 %  sodium chloride infusion  500 mL Intravenous Once Ladene Artist, MD         3.) Review of functional ability and level of safety:  Any difficulty hearing?  See scanned documentation  History of falling?  See scanned documentation  Any trouble with IADLs - using a phone, using transportation, grocery shopping, preparing meals, doing housework, doing laundry, taking medications and managing money?  See scanned documentation  Advance  Directives? See scanned documentation See summary of recommendations in Patient Instructions below.  4.) Physical Exam Vitals:   04/14/18 1257  BP: 128/70  Pulse: 83  Temp: 98.5 F (36.9 C)  SpO2: 94%   Estimated body mass index is 26.79 kg/m as calculated from the following:   Height as of this encounter: 5' 7.75" (1.721 m).   Weight as of this encounter: 174 lb 14.4 oz (79.3 kg).  EKG (optional): deferred  General: alert,  appear well hydrated and in no acute distress  HEENT: visual acuity grossly intact  CV: HRRR  Lungs: CTA bilaterally  Psych: pleasant and cooperative, no obvious depression or anxiety  Cognitive function grossly intact  See patient instructions for recommendations.  Education and counseling regarding the above review of health provided with a plan for the following: -see scanned patient completed form for further details -fall prevention strategies discussed  -healthy lifestyle discussed -importance and resources for completing advanced directives discussed -see patient instructions below for any other recommendations provided  4)The following written screening schedule of preventive measures were reviewed with assessment and plan made per below, orders and patient instructions:      AAA screening done if applicable: n/a     Alcohol screening done     Obesity Screening and counseling done     STI screening (Hep C if born 1944-65) offered and per pt wishes     Tobacco Screening done done       Pneumococcal (PPSV23 -one dose after 64, one before if risk factors), influenza yearly and hepatitis B vaccines (if high risk - end stage renal disease, IV drugs, homosexual men, live in home for mentally retarded, hemophilia receiving factors) ASSESSMENT/PLAN: done if applicable     Prostate cancer screening  ASSESSMENT/PLAN: n/a, declined - used to see Dr. Jeffie Pollock; urology, for regular screening - reports he advised him to stop screening now that he is 74  - discussed      Colorectal cancer screening (FOBT yearly or flex sig q4y or colonoscopy q10y or barium enema q4y) ASSESSMENT/PLAN: utd       Diabetes outpatient self-management training services ASSESSMENT/PLAN: utd or done      Bone mass measurements(covered q2y if indicated - estrogen def, osteoporosis, hyperparathyroid, vertebral abnormalities, osteoporosis or steroids) ASSESSMENT/PLAN: utd or discussed and ordered per pt wishes      Screening for glaucoma(q1y if high risk - diabetes, FH, AA and > 50 or hispanic and > 65) ASSESSMENT/PLAN: utd or advised      Medical nutritional therapy for individuals with diabetes or renal disease ASSESSMENT/PLAN: see orders      Cardiovascular screening blood tests (lipids q5y) ASSESSMENT/PLAN: see orders and labs      Diabetes screening tests ASSESSMENT/PLAN: see orders and labs   7.) Summary:   Initial Medicare annual wellness visit -risk factors and conditions per above assessment were discussed and treatment, recommendations and referrals were offered per documentation above and orders and patient instructions.  Essential hypertension - Plan: Basic metabolic panel, CBC Hyperlipidemia, unspecified hyperlipidemia type - Plan: Cholesterol, total, HDL cholesterol -labs per orders -lifestyle recs -cont current meds  Lumbar degenerative disc disease -discussed tx options, he may consider OMM -sees Dr. Letta Pate  Patient Instructions   BEFORE YOU LEAVE: -labs -follow up: 4 months   Mr. Tabora , Thank you for taking time to come for your Medicare Wellness Visit. I appreciate your ongoing commitment to your health goals. Please review the following plan we discussed and let me know if I can assist you in the future.   These are the goals we discussed: Goals    Check into the cost of the new shingles vaccine (Shinrix) with your insurance and let us know if you would like to do this. Eat a healthy low sugar diet and continue to get  regular aerobic exercise      This is a list of the screening recommended for you and due dates:  Health Maintenance  Topic  Date Due  . HIV Screening  02/27/2025*  . Flu Shot  05/26/2018  . Pneumonia vaccines (2 of 2 - PPSV23) 01/12/2019  . Colon Cancer Screening  11/09/2022  . Tetanus Vaccine  09/10/2027  .  Hepatitis C: One time screening is recommended by Center for Disease Control  (CDC) for  adults born from [birth year] through 1964.   Completed  *Topic was postponed. The date shown is not the original due date.   We have ordered labs or studies at this visit. It can take up to 1-2 weeks for results and processing. IF results require follow up or explanation, we will call you with instructions. Clinically stable results will be released to your [facility]. If you have not heard from [place] or cannot find your results in [facility] in 2 weeks please contact our office at [phone].  If you are not yet signed up for [facility], please consider signing up.      Preventive Care 67 Years and Older, Male Preventive care refers to lifestyle choices and visits with your health care provider that can promote health and wellness. What does preventive care include?  A yearly physical exam. This is also called an annual well check.  Dental exams once or twice a year.  Routine eye exams. Ask your health care provider how often you should have your eyes checked.  Personal lifestyle choices, including: ? Daily care of your teeth and gums. ? Regular physical activity. ? Eating a healthy diet. ? Avoiding tobacco and drug use. ? Limiting alcohol use. ? Practicing safe sex. ? Taking vitamin and mineral supplements as recommended by your health care provider. What happens during an annual well check? The services and screenings done by your health care provider during your annual well check will depend on your age, overall health, lifestyle risk factors, and family history of disease. Counseling Your  health care provider may ask you questions about your:  Alcohol use.  Tobacco use.  Drug use.  Emotional well-being.  Home and relationship well-being.  Sexual activity.  Eating habits.  History of falls.  Memory and ability to understand (cognition).  Work and work Statistician.  Screening You may have the following tests or measurements:  Height, weight, and BMI.  Blood pressure.  Lipid and cholesterol levels. These may be checked every 5 years, or more frequently if you are over 66 years old.  Skin check.  Lung cancer screening. You may have this screening every year starting at age 36 if you have a 30-pack-year history of smoking and currently smoke or have quit within the past 15 years.  Flexible sigmoidoscopy or colonoscopy. You may have a sigmoidoscopy every 5 years or a colonoscopy every 10 years starting at age 66.  Prostate cancer screening. Recommendations will vary depending on your family history and other risks.  Hepatitis C blood test.  Hepatitis B blood test.  Sexually transmitted disease (STD) testing.  Diabetes screening. This is done by checking your blood sugar (glucose) after you have not eaten for a while (fasting). You may have this done every 1-3 years.  Abdominal aortic aneurysm (AAA) screening. You may need this if you are a current or former smoker.  Osteoporosis. You may be screened starting at age 27 if you are at high risk.  Talk with your health care provider about your test results, treatment options, and if necessary, the need for more tests. Vaccines Your health care provider may recommend certain vaccines, such as:  Influenza vaccine. This  is recommended every year.  Tetanus, diphtheria, and acellular pertussis (Tdap, Td) vaccine. You may need a Td booster every 10 years.  Varicella vaccine. You may need this if you have not been vaccinated.  Zoster vaccine. You may need this after age 19.  Measles, mumps, and rubella  (MMR) vaccine. You may need at least one dose of MMR if you were born in 1957 or later. You may also need a second dose.  Pneumococcal 13-valent conjugate (PCV13) vaccine. One dose is recommended after age 53.  Pneumococcal polysaccharide (PPSV23) vaccine. One dose is recommended after age 29.  Meningococcal vaccine. You may need this if you have certain conditions.  Hepatitis A vaccine. You may need this if you have certain conditions or if you travel or work in places where you may be exposed to hepatitis A.  Hepatitis B vaccine. You may need this if you have certain conditions or if you travel or work in places where you may be exposed to hepatitis B.  Haemophilus influenzae type b (Hib) vaccine. You may need this if you have certain risk factors.  Talk to your health care provider about which screenings and vaccines you need and how often you need them. This information is not intended to replace advice given to you by your health care provider. Make sure you discuss any questions you have with your health care provider. Document Released: 11/08/2015 Document Revised: 07/01/2016 Document Reviewed: 08/13/2015 Elsevier Interactive Patient Education  2018 [place], [place]

## 2018-04-21 NOTE — Addendum Note (Signed)
Addended by: Agnes Lawrence on: 04/21/2018 03:48 PM   Modules accepted: Orders

## 2018-05-05 ENCOUNTER — Encounter: Payer: Self-pay | Admitting: Family Medicine

## 2018-05-05 ENCOUNTER — Ambulatory Visit (INDEPENDENT_AMBULATORY_CARE_PROVIDER_SITE_OTHER): Payer: Medicare Other | Admitting: Family Medicine

## 2018-05-05 VITALS — BP 122/78 | HR 74 | Temp 98.9°F | Ht 67.75 in | Wt 181.8 lb

## 2018-05-05 DIAGNOSIS — M545 Low back pain: Secondary | ICD-10-CM

## 2018-05-05 DIAGNOSIS — M9902 Segmental and somatic dysfunction of thoracic region: Secondary | ICD-10-CM

## 2018-05-05 DIAGNOSIS — M9903 Segmental and somatic dysfunction of lumbar region: Secondary | ICD-10-CM | POA: Diagnosis not present

## 2018-05-05 DIAGNOSIS — M9905 Segmental and somatic dysfunction of pelvic region: Secondary | ICD-10-CM

## 2018-05-05 DIAGNOSIS — M9906 Segmental and somatic dysfunction of lower extremity: Secondary | ICD-10-CM | POA: Diagnosis not present

## 2018-05-05 DIAGNOSIS — M9901 Segmental and somatic dysfunction of cervical region: Secondary | ICD-10-CM

## 2018-05-05 DIAGNOSIS — G8929 Other chronic pain: Secondary | ICD-10-CM | POA: Diagnosis not present

## 2018-05-05 NOTE — Progress Notes (Signed)
HPI:  Using dictation device. Unfortunately this device frequently misinterprets words/phrases.  Acute visit for Low back pain: -hx DDD, chronic low back pain intermittently - usually R low back, sometime L, usually sharp pain triggered by certain activities - bending over; heat, massage, muscle relaxer help; exercise helps -MRI 2017 with: right facet arthritis at L3-L4,chronic soft disc protrusion at L4-L5 with possible nerve root compression --> was referred to specialist, Dr. Letta Pate -meds: prn nsaids and robaxin, tizanidine made him sleepy Today reports-he had a flare in this pain today after doing some projects yesterday he was leaning over a lot, he had 6 out of 10 pain during his water aerobics class this morning.  Felt better after walking.  He did not take any medication for this.  Pain is mild at this point and is in the right low back. Denies any radiation, weakness, numbness, bowel or bladder dysfunction   ROS: See pertinent positives and negatives per HPI.  Past Medical History:  Diagnosis Date  . Arthritis    L3-L4  . Cataract   . Colon polyps   . Diverticulitis   . GERD (gastroesophageal reflux disease)   . Hyperlipidemia   . Hypertension     Past Surgical History:  Procedure Laterality Date  . APPENDECTOMY    . COLONOSCOPY    . POLYPECTOMY    . PRE-MALIGNANT / BENIGN SKIN LESION EXCISION      Family History  Problem Relation Age of Onset  . Heart disease Father   . Colon cancer Neg Hx   . Pancreatic cancer Neg Hx   . Rectal cancer Neg Hx   . Stomach cancer Neg Hx   . Esophageal cancer Neg Hx     SOCIAL HX: See HPI   Current Outpatient Medications:  .  amLODipine (NORVASC) 5 MG tablet, Take 1 tablet (5 mg total) by mouth daily., Disp: 90 tablet, Rfl: 1 .  aspirin 81 MG EC tablet, Take 81 mg by mouth daily.  , Disp: , Rfl:  .  bisoprolol (ZEBETA) 5 MG tablet, Take 1 tablet (5 mg total) by mouth daily., Disp: 90 tablet, Rfl: 1 .  cholecalciferol  (VITAMIN D) 1000 UNITS tablet, Take 1,000 Units by mouth daily., Disp: , Rfl:  .  Flaxseed, Linseed, (FLAXSEED OIL PO), Take by mouth as directed., Disp: , Rfl:  .  MAGNESIUM PO, Take by mouth daily., Disp: , Rfl:  .  methocarbamol (ROBAXIN) 500 MG tablet, Take 1 tablet (500 mg total) by mouth daily., Disp: 30 tablet, Rfl: 2 .  Multiple Vitamin (MULTIVITAMIN) tablet, Take 1 tablet by mouth daily.  , Disp: , Rfl:  .  Omega-3 Fatty Acids (FISH OIL PO), Take by mouth daily., Disp: , Rfl:  .  Probiotic Product (PROBIOTIC PO), Take by mouth daily., Disp: , Rfl:  .  rosuvastatin (CRESTOR) 20 MG tablet, Take 1 tablet (20 mg total) by mouth daily., Disp: 90 tablet, Rfl: 1  Current Facility-Administered Medications:  .  0.9 %  sodium chloride infusion, 500 mL, Intravenous, Once, Ladene Artist, MD  EXAM:  Vitals:   05/05/18 1605  BP: 122/78  Pulse: 74  Temp: 98.9 F (37.2 C)    Body mass index is 27.85 kg/m.  GENERAL: vitals reviewed and listed above, alert, oriented, appears well hydrated and in no acute distress  HEENT: atraumatic, conjunttiva clear, no obvious abnormalities on inspection of external nose and ears  NECK: no obvious masses on inspection  MS: moves all extremities without noticeable  abnormality, normal lower extremity function with normal strength to touch, gait is normal, he has a head forward posture years approximately 2 inches in the shoulders, his right shoulder is slightly superior, he has mild valgus deformities ankles and mild pes planus bilaterally, OA is rotated right, thoracic and is rotated left, T8-12 are rotated right side bent left, L3 posterior tender point, right anterior innominate, facial restriction pronation bilat ankles  PSYCH: pleasant and cooperative, no obvious depression or anxiety  ASSESSMENT AND PLAN:  Discussed the following assessment and plan:  Chronic right-sided low back pain without sciatica  Somatic dysfunction of spine,  lumbar  Somatic dysfunction of pelvis region  Somatic dysfunction of spine, thoracic  Somatic dysfunction of spine, cervical  Somatic dysfunction of both lower extremities  -we discussed finding, etiologies,  treatment, treatment risks and return precautions  -after this discussion, Dequan opted for OMM today (see below), continuation exercise, postural exercises provided today to add to his routine, prn heat/massage/topical products/muscle relaxer -follow up advised 1-3 weeks -of course, we advised Barnard  to return or notify a doctor immediately if symptoms worsen or persist or new concerns arise.  PROCEDURE NOTE : OSTEOPATHIC TREATMENT The decision today to treat with gentle Osteopathic Manipulative Therapy  (OMT) was based on physical exam findings, diagnoses and patient wishes. Verbal consent was obtained after after explanation of risks and benefits. No Cervical HVLA manipulation was performed. After consent was obtained, treatment was  performed as below:      [place] treated: lumbar, pelvic, thoracic, cervical, le     Techniques used: ME, counterstrain, MR, BLT The patient tolerated the treatment well   symptoms following treatment today. Follow up treatment was advised in: 1-2 weeks   Patient Instructions  BEFORE YOU LEAVE: -follow up: OMM in 1-3 weeks (30 minute OMT slot)  Do the postural neck strengthening exercises daily: -stand with heals, buttocks, shoulder blades and head against wall -press back against wall gently with your head for 5 seconds, then against counter pressure with finger tips: to the right for 5 seconds, forward for 5 seconds, then to the left for 5 second (keep head straight and against wall the entire time) -repeat 10 times  Continue water exercise and walking  Heat, massage, topical menthol (tiger balm), muscle relaxer as needed    Lucretia Kern, DO

## 2018-05-05 NOTE — Patient Instructions (Signed)
BEFORE YOU LEAVE: -follow up: OMM in 1-3 weeks (30 minute OMT slot)  Do the postural neck strengthening exercises daily: -stand with heals, buttocks, shoulder blades and head against wall -press back against wall gently with your head for 5 seconds, then against counter pressure with finger tips: to the right for 5 seconds, forward for 5 seconds, then to the left for 5 second (keep head straight and against wall the entire time) -repeat 10 times  Continue water exercise and walking  Heat, massage, topical menthol (tiger balm), muscle relaxer as needed

## 2018-05-19 ENCOUNTER — Ambulatory Visit: Payer: Medicare Other | Admitting: Family Medicine

## 2018-05-26 ENCOUNTER — Other Ambulatory Visit: Payer: Medicare Other

## 2018-05-30 ENCOUNTER — Other Ambulatory Visit (INDEPENDENT_AMBULATORY_CARE_PROVIDER_SITE_OTHER): Payer: Medicare Other

## 2018-05-30 DIAGNOSIS — D72819 Decreased white blood cell count, unspecified: Secondary | ICD-10-CM

## 2018-05-30 LAB — CBC WITH DIFFERENTIAL/PLATELET
BASOS PCT: 0.5 % (ref 0.0–3.0)
Basophils Absolute: 0 10*3/uL (ref 0.0–0.1)
EOS ABS: 0.1 10*3/uL (ref 0.0–0.7)
Eosinophils Relative: 3 % (ref 0.0–5.0)
HCT: 47.3 % (ref 39.0–52.0)
HEMOGLOBIN: 16.6 g/dL (ref 13.0–17.0)
Lymphocytes Relative: 27.3 % (ref 12.0–46.0)
Lymphs Abs: 1.2 10*3/uL (ref 0.7–4.0)
MCHC: 35.1 g/dL (ref 30.0–36.0)
MCV: 95.3 fl (ref 78.0–100.0)
MONO ABS: 0.6 10*3/uL (ref 0.1–1.0)
Monocytes Relative: 14.2 % — ABNORMAL HIGH (ref 3.0–12.0)
Neutro Abs: 2.4 10*3/uL (ref 1.4–7.7)
Neutrophils Relative %: 55 % (ref 43.0–77.0)
PLATELETS: 180 10*3/uL (ref 150.0–400.0)
RBC: 4.96 Mil/uL (ref 4.22–5.81)
RDW: 13.5 % (ref 11.5–15.5)
WBC: 4.4 10*3/uL (ref 4.0–10.5)

## 2018-06-01 ENCOUNTER — Telehealth: Payer: Self-pay | Admitting: *Deleted

## 2018-06-01 NOTE — Telephone Encounter (Signed)
Prior authorization submitted for methocarbamol to [facility]

## 2018-06-23 ENCOUNTER — Encounter: Payer: Self-pay | Admitting: Family Medicine

## 2018-06-23 ENCOUNTER — Ambulatory Visit (INDEPENDENT_AMBULATORY_CARE_PROVIDER_SITE_OTHER): Payer: Medicare Other | Admitting: Family Medicine

## 2018-06-23 VITALS — BP 120/80 | HR 83 | Temp 98.8°F | Ht 67.75 in | Wt 180.1 lb

## 2018-06-23 DIAGNOSIS — M79642 Pain in left hand: Secondary | ICD-10-CM

## 2018-06-23 DIAGNOSIS — Z7189 Other specified counseling: Secondary | ICD-10-CM | POA: Diagnosis not present

## 2018-06-23 DIAGNOSIS — Z23 Encounter for immunization: Secondary | ICD-10-CM | POA: Diagnosis not present

## 2018-06-23 DIAGNOSIS — Z7185 Encounter for immunization safety counseling: Secondary | ICD-10-CM

## 2018-06-23 DIAGNOSIS — G479 Sleep disorder, unspecified: Secondary | ICD-10-CM | POA: Diagnosis not present

## 2018-06-23 MED ORDER — ZALEPLON 5 MG PO CAPS: 5.0000 mg | ORAL_CAPSULE | Freq: Every evening | ORAL | 0 refills | Status: DC | PRN

## 2018-06-23 NOTE — Addendum Note (Signed)
Addended by: Agnes Lawrence on: 06/23/2018 03:44 PM   Modules accepted: Orders

## 2018-06-23 NOTE — Progress Notes (Signed)
HPI:  Using dictation device. Unfortunately this device frequently misinterprets words/phrases.  Acute visit for several issues:  Pain L hand: -small nodule on L hand that is a little tender -for about 1 week -cant remember injury  -no other lesions, clicking or catching  Insomnia: -has a lot of trouble sleeping when travels -uses low dose sonata rarely -I have advised him extensively on the risks associated with the use of sleep aides - but he prefers to use and assume these risks as feels benefits> risks -requests refill  ROS: See pertinent positives and negatives per HPI.  Past Medical History:  Diagnosis Date  . Arthritis    L3-L4  . Cataract   . Colon polyps   . Diverticulitis   . GERD (gastroesophageal reflux disease)   . Hyperlipidemia   . Hypertension     Past Surgical History:  Procedure Laterality Date  . APPENDECTOMY    . COLONOSCOPY    . POLYPECTOMY    . PRE-MALIGNANT / BENIGN SKIN LESION EXCISION      Family History  Problem Relation Age of Onset  . Heart disease Father   . Colon cancer Neg Hx   . Pancreatic cancer Neg Hx   . Rectal cancer Neg Hx   . Stomach cancer Neg Hx   . Esophageal cancer Neg Hx     SOCIAL HX: see hpi   Current Outpatient Medications:  .  amLODipine (NORVASC) 5 MG tablet, Take 1 tablet (5 mg total) by mouth daily., Disp: 90 tablet, Rfl: 1 .  aspirin 81 MG EC tablet, Take 81 mg by mouth daily.  , Disp: , Rfl:  .  bisoprolol (ZEBETA) 5 MG tablet, Take 1 tablet (5 mg total) by mouth daily., Disp: 90 tablet, Rfl: 1 .  cholecalciferol (VITAMIN D) 1000 UNITS tablet, Take 1,000 Units by mouth daily., Disp: , Rfl:  .  Flaxseed, Linseed, (FLAXSEED OIL PO), Take by mouth as directed., Disp: , Rfl:  .  MAGNESIUM PO, Take by mouth daily., Disp: , Rfl:  .  methocarbamol (ROBAXIN) 500 MG tablet, Take 1 tablet (500 mg total) by mouth daily., Disp: 30 tablet, Rfl: 2 .  Multiple Vitamin (MULTIVITAMIN) tablet, Take 1 tablet by mouth  daily.  , Disp: , Rfl:  .  Omega-3 Fatty Acids (FISH OIL PO), Take by mouth daily., Disp: , Rfl:  .  Probiotic Product (PROBIOTIC PO), Take by mouth daily., Disp: , Rfl:  .  rosuvastatin (CRESTOR) 20 MG tablet, Take 1 tablet (20 mg total) by mouth daily., Disp: 90 tablet, Rfl: 1 .  zaleplon (SONATA) 5 MG capsule, Take 1 capsule (5 mg total) by mouth at bedtime as needed for sleep., Disp: 10 capsule, Rfl: 0  Current Facility-Administered Medications:  .  0.9 %  sodium chloride infusion, 500 mL, Intravenous, Once, Lucio Edward T, MD  EXAM:  Vitals:   06/23/18 1443  BP: 120/80  Pulse: 83  Temp: 98.8 F (37.1 C)    Body mass index is 27.59 kg/m.  GENERAL: vitals reviewed and listed above, alert, oriented, appears well hydrated and in no acute distress  HEENT: atraumatic, conjunttiva clear, no obvious abnormalities on inspection of external nose and ears  NECK: no obvious masses on inspection  MS: moves all extremities without noticeable abnormality, small nodule L plantar hand near 4th MCP joint, mildly tender, no clicking or catching  PSYCH: pleasant and cooperative, no obvious depression or anxiety  ASSESSMENT AND PLAN:  Discussed the following assessment and plan:  Pain of left hand -query small tendon sheath nodule versus small vasc lesion vs other -discussed options and will place referral to sports med for further eval - he has seen Dr. Paulla Fore before and had a good experience   Sleep disorder -have discussed risks with sleep aides at length with him, but he has had good success without side effects and feels benefits > risks -refills sent  Vaccine counseling - discussed best timing and he will decide now or October at [facility]  -Patient advised to return or notify a doctor immediately if symptoms worsen or persist or new concerns arise.  Patient Instructions  BEFORE YOU LEAVE: -follow up: as scheduled for physical  -We placed a referral for you as discussed to sports  medicine regarding the hand. It usually takes about 1-2 weeks to process and schedule this referral. If you have not heard from [place] regarding this appointment in 2 weeks please contact our office.  - I sent the refill you requested  Zaleplon capsules What is this medicine? ZALEPLON (ZAL e plon) is used to treat insomnia. This medicine helps you to fall asleep. This medicine may be used for other purposes; ask your health care provider or pharmacist if you have questions. COMMON BRAND NAME(S): Sonata What should I tell my health care provider before I take this medicine? They need to know if you have any of these conditions: -depression -history of a drug or alcohol abuse problem -liver disease -lung or breathing disease -suicidal thoughts -an unusual or allergic reaction to zaleplon, other medicines, foods, dyes, or preservatives -pregnant or trying to get pregnant -breast-feeding How should I use this medicine? Take this medicine by mouth with a glass of water. Follow the directions on the prescription label. It is better to take this medicine on an empty stomach and only when you are ready for bed. Do not take your medicine more often than directed. If you have been taking this medicine for several weeks and suddenly stop taking it, you may get unpleasant withdrawal symptoms. Your doctor or health care professional may want to gradually reduce the dose. Do not stop taking this medicine on your own. Always follow your doctor or health care professional's advice. Talk to your pediatrician regarding the use of this medicine in children. Special care may be needed. Overdosage: If you think you have taken too much of this medicine contact a poison control center or emergency room at once. NOTE: This medicine is only for you. Do not share this medicine with others. What if I miss a dose? This does not apply. This medicine should only be taken immediately before going to sleep. Do not take double  or extra doses. What may interact with this medicine? -barbiturate medicines for inducing sleep or treating seizures -carbamazepine -certain medicines for allergies, like azatadine, clemastine, diphenhydramine -certain medicines for depression, anxiety, or other emotional or psychiatric problems -certain medicines for pain -cimetidine -erythromycin -medicines for fungal infections like ketoconazole, fluconazole, or itraconazole -other medicines given for sleep -phenytoin -rifampin This list may not describe all possible interactions. Give your health care provider a list of all the medicines, herbs, non-prescription drugs, or dietary supplements you use. Also tell them if you smoke, drink alcohol, or use illegal drugs. Some items may interact with your medicine. What should I watch for while using this medicine? Visit your doctor or health care professional for regular checks on your progress. Keep a regular sleep schedule by going to bed at about the same time  each night. Avoid caffeine-containing drinks in the evening hours. When sleep medicines are used every night for more than a few weeks, they may stop working. Talk to your doctor if you still have trouble sleeping. After taking this medicine for sleep, you may get up out of bed while not being fully awake and do an activity that you do not know you are doing. The next morning, you may have no memory of the event. Activities such as driving a car ("sleep-driving"), making and eating food, talking on the phone, sexual activity, and sleep-walking have been reported. Call your doctor right away if you find out you have done any of these activities. Do not take this medicine if you have used alcohol that evening or before bed or taken another medicine for sleep, since your risk of doing these sleep-related activities will be increased. Do not take this medicine unless you are able to stay in bed for a full night (7 to 8 hours) before you must be  active again. You may have a decrease in mental alertness the day after use, even if you feel that you are fully awake. Tell your doctor if you will need to perform activities requiring full alertness, such as driving, the next day. Do not stand or sit up quickly after taking this medicine, especially if you are an older patient. This reduces the risk of dizzy or fainting spells. If you or your family notice any changes in your behavior, such as new or worsening depression, thoughts of harming yourself, anxiety, other unusual or disturbing thoughts, or memory loss, call your doctor right away. After you stop taking this medicine, you may have trouble falling asleep. This is called rebound insomnia. This problem usually goes away on its own after 1 or 2 nights. What side effects may I notice from receiving this medicine? Side effects that you should report to your doctor or health care professional as soon as possible: -allergic reactions like skin rash, itching or hives, swelling of the face, lips, or tongue -breathing problems -changes in vision -confusion -depression, suicidal thoughts -feeling faint or lightheaded -hallucinations -hostility, restlessness, excitability -slurred speech -staggering, tremors -unusual activities while asleep like driving, eating, making phone calls -unusually weak or tired Side effects that usually do not require medical attention (report to your doctor or health care professional if they continue or are bothersome): -diarrhea -difficulty with coordination -loss of memory -nightmares -stomach upset This list may not describe all possible side effects. Call your doctor for medical advice about side effects. You may report side effects to FDA at 1-800-FDA-1088. Where should I keep my medicine? Keep out of the reach of children. This medicine can be abused. Keep your medicine in a safe place to protect it from theft. Do not share this medicine with anyone.  Selling or giving away this medicine is dangerous and against the law. This medicine may cause accidental overdose and death if taken by other adults, children, or pets. Mix any unused medicine with a substance like cat litter or coffee grounds. Then throw the medicine away in a sealed container like a sealed bag or a coffee can with a lid. Do not use the medicine after the expiration date. Store at room temperature between 20 and 25 degrees C (68 and 77 degrees F). Protect from light. NOTE: This sheet is a summary. It may not cover all possible information. If you have questions about this medicine, talk to your doctor, pharmacist, or health care provider.  2018 Elsevier/Gold Standard (2014-07-03 15:43:05)    Lucretia Kern, DO

## 2018-06-23 NOTE — Patient Instructions (Signed)
BEFORE YOU LEAVE: -follow up: as scheduled for physical  -We placed a referral for you as discussed to sports medicine regarding the hand. It usually takes about 1-2 weeks to process and schedule this referral. If you have not heard from [place] regarding this appointment in 2 weeks please contact our office.  - I sent the refill you requested  Zaleplon capsules What is this medicine? ZALEPLON (ZAL e plon) is used to treat insomnia. This medicine helps you to fall asleep. This medicine may be used for other purposes; ask your health care provider or pharmacist if you have questions. COMMON BRAND NAME(S): Sonata What should I tell my health care provider before I take this medicine? They need to know if you have any of these conditions: -depression -history of a drug or alcohol abuse problem -liver disease -lung or breathing disease -suicidal thoughts -an unusual or allergic reaction to zaleplon, other medicines, foods, dyes, or preservatives -pregnant or trying to get pregnant -breast-feeding How should I use this medicine? Take this medicine by mouth with a glass of water. Follow the directions on the prescription label. It is better to take this medicine on an empty stomach and only when you are ready for bed. Do not take your medicine more often than directed. If you have been taking this medicine for several weeks and suddenly stop taking it, you may get unpleasant withdrawal symptoms. Your doctor or health care professional may want to gradually reduce the dose. Do not stop taking this medicine on your own. Always follow your doctor or health care professional's advice. Talk to your pediatrician regarding the use of this medicine in children. Special care may be needed. Overdosage: If you think you have taken too much of this medicine contact a poison control center or emergency room at once. NOTE: This medicine is only for you. Do not share this medicine with others. What if I miss a  dose? This does not apply. This medicine should only be taken immediately before going to sleep. Do not take double or extra doses. What may interact with this medicine? -barbiturate medicines for inducing sleep or treating seizures -carbamazepine -certain medicines for allergies, like azatadine, clemastine, diphenhydramine -certain medicines for depression, anxiety, or other emotional or psychiatric problems -certain medicines for pain -cimetidine -erythromycin -medicines for fungal infections like ketoconazole, fluconazole, or itraconazole -other medicines given for sleep -phenytoin -rifampin This list may not describe all possible interactions. Give your health care provider a list of all the medicines, herbs, non-prescription drugs, or dietary supplements you use. Also tell them if you smoke, drink alcohol, or use illegal drugs. Some items may interact with your medicine. What should I watch for while using this medicine? Visit your doctor or health care professional for regular checks on your progress. Keep a regular sleep schedule by going to bed at about the same time each night. Avoid caffeine-containing drinks in the evening hours. When sleep medicines are used every night for more than a few weeks, they may stop working. Talk to your doctor if you still have trouble sleeping. After taking this medicine for sleep, you may get up out of bed while not being fully awake and do an activity that you do not know you are doing. The next morning, you may have no memory of the event. Activities such as driving a car ("sleep-driving"), making and eating food, talking on the phone, sexual activity, and sleep-walking have been reported. Call your doctor right away if you find out you  have done any of these activities. Do not take this medicine if you have used alcohol that evening or before bed or taken another medicine for sleep, since your risk of doing these sleep-related activities will be  increased. Do not take this medicine unless you are able to stay in bed for a full night (7 to 8 hours) before you must be active again. You may have a decrease in mental alertness the day after use, even if you feel that you are fully awake. Tell your doctor if you will need to perform activities requiring full alertness, such as driving, the next day. Do not stand or sit up quickly after taking this medicine, especially if you are an older patient. This reduces the risk of dizzy or fainting spells. If you or your family notice any changes in your behavior, such as new or worsening depression, thoughts of harming yourself, anxiety, other unusual or disturbing thoughts, or memory loss, call your doctor right away. After you stop taking this medicine, you may have trouble falling asleep. This is called rebound insomnia. This problem usually goes away on its own after 1 or 2 nights. What side effects may I notice from receiving this medicine? Side effects that you should report to your doctor or health care professional as soon as possible: -allergic reactions like skin rash, itching or hives, swelling of the face, lips, or tongue -breathing problems -changes in vision -confusion -depression, suicidal thoughts -feeling faint or lightheaded -hallucinations -hostility, restlessness, excitability -slurred speech -staggering, tremors -unusual activities while asleep like driving, eating, making phone calls -unusually weak or tired Side effects that usually do not require medical attention (report to your doctor or health care professional if they continue or are bothersome): -diarrhea -difficulty with coordination -loss of memory -nightmares -stomach upset This list may not describe all possible side effects. Call your doctor for medical advice about side effects. You may report side effects to FDA at 1-800-FDA-1088. Where should I keep my medicine? Keep out of the reach of children. This  medicine can be abused. Keep your medicine in a safe place to protect it from theft. Do not share this medicine with anyone. Selling or giving away this medicine is dangerous and against the law. This medicine may cause accidental overdose and death if taken by other adults, children, or pets. Mix any unused medicine with a substance like cat litter or coffee grounds. Then throw the medicine away in a sealed container like a sealed bag or a coffee can with a lid. Do not use the medicine after the expiration date. Store at room temperature between 20 and 25 degrees C (68 and 77 degrees F). Protect from light. NOTE: This sheet is a summary. It may not cover all possible information. If you have questions about this medicine, talk to your doctor, pharmacist, or health care provider.  2018 Elsevier/Gold Standard (2014-07-03 15:43:05)

## 2018-06-24 ENCOUNTER — Ambulatory Visit: Payer: Self-pay

## 2018-06-24 ENCOUNTER — Encounter: Payer: Self-pay | Admitting: Sports Medicine

## 2018-06-24 ENCOUNTER — Ambulatory Visit (INDEPENDENT_AMBULATORY_CARE_PROVIDER_SITE_OTHER): Payer: Medicare Other | Admitting: Sports Medicine

## 2018-06-24 VITALS — BP 138/80 | HR 93 | Ht 67.75 in | Wt 181.0 lb

## 2018-06-24 DIAGNOSIS — M79642 Pain in left hand: Secondary | ICD-10-CM | POA: Diagnosis not present

## 2018-06-24 NOTE — Procedures (Signed)
LIMITED MSK ULTRASOUND OF Left hand Images were obtained and interpreted by myself, Teresa Coombs, DO  Images have been saved and stored to PACS system. Images obtained on: GE S7 Ultrasound machine  FINDINGS:   Small nodule over the fourth MCP that is slightly discolored on physical exam he does have nodular appearance and is a hypoechoic change on ultrasound with no increased flow but directly adjacent to the neurovascular bundle.  There is no connection to the tendon sheath or to the joint line.  IMPRESSION:  1. Small traumatic hematoma likely secondary to venous injury

## 2018-06-24 NOTE — Progress Notes (Signed)
Clinton Newton. Clinton Newton, [place] [facility] [place]  Clinton Newton - 67 y.o. male MRN [id number]  Date of birth: 22-Aug-1951  Visit Date: 06/24/2018  PCP: Lucretia Kern, DO   Referred by: Lucretia Kern, DO  Scribe(s) for today's visit: Wendy Poet, LAT, ATC  SUBJECTIVE:  Clinton Newton is here for New Patient (Initial Visit) (L palm contusion) .  Referred by: Dr. Maudie Mercury  His L palm (3rd MCP joint) pain symptoms INITIALLY: Began about about a week ago w/ no MOI Described as mild-mod sharp pain depending on how much pressure is applied to the area , nonradiating Worsened with pressure to the area, gripping w/ his L hand Improved with relieving pressure to the area Additional associated symptoms include: no N/T and no mechanical symptoms noted    At this time symptoms show no change compared to onset    REVIEW OF SYSTEMS: Denies night time disturbances. Denies fevers, chills, or night sweats. Denies unexplained weight loss. Denies personal history of cancer. Denies changes in bowel or bladder habits. Denies recent unreported falls. Denies new or worsening dyspnea or wheezing. Denies headaches or dizziness.  Denies numbness, tingling or weakness  In the extremities.  Denies dizziness or presyncopal episodes Denies lower extremity edema     HISTORY & PERTINENT PRIOR DATA:  Significant/pertinent history, findings, studies include:  reports that he has never smoked. He has never used smokeless tobacco. No results for input(s): HGBA1C, LABURIC, CREATINE in the last 8760 hours. No specialty comments available. No problems updated.  Otherwise prior history reviewed and updated per electronic medical record.    OBJECTIVE:  VS:  HT:5' 7.75" (172.1 cm)   WT:181 lb (82.1 kg)  BMI:27.72    BP:138/80  HR:93bpm  TEMP: ( )  RESP:95 %   PHYSICAL EXAM: CONSTITUTIONAL: Well-developed, Well-nourished and In no acute distress Alert &  appropriately interactive. and Not depressed or anxious appearing. RESPIRATORY: No increased work of breathing and Trachea Midline EYES: Pupils are equal., EOM intact without nystagmus. and No scleral icterus.  Upper extremities: Warm and well perfused NEURO: unremarkable  MSK Exam: Left hand: . Well aligned, no significant deformity. . No obvious overlying skin changes however on close inspection he does have a small palpable nodule over the flexor aspect of the MCP on the ulnar aspect that is approximately 3 x 3 mm in size it is slightly cystic and slightly bluish in appearance . Small palpable nodule as above . No focal bony tenderness . Normal range of motion of the fingers and wrists . Ligamentously stable .    PROCEDURES & DATA REVIEWED:  . Diagnostic Ultrasound performed today per procedure note  ASSESSMENT   1. Pain of left hand     PLAN:   Use the topical diclofenac previously have been prescribed Rest the injured area as much as practical Apply ice packs    . Consistent with a small traumatic hematoma.  . Recommend conservative treatment at this time per AVS No problem-specific Assessment & Plan notes found for this encounter.  Follow-up: Return if symptoms worsen or fail to improve.      Please see additional documentation for Objective, Assessment and Plan sections. Pertinent additional documentation may be included in corresponding procedure notes, imaging studies, problem based documentation and patient instructions. Please see these sections of the encounter for additional information regarding this visit.  CMA/ATC served as Education administrator during this visit. History, Physical, and Plan performed by medical  provider. Documentation and orders reviewed and attested to.      Gerda Diss, [facility] Physician

## 2018-06-24 NOTE — Patient Instructions (Signed)
Try using the Voltaren gel for Try soaking her hand and cool water for 10 minutes 2-3 times per day.   Try to avoid aggravating this area and try using a glove when you lift weights.

## 2018-07-07 NOTE — Progress Notes (Deleted)
Patient: Clinton Newton MRN: [id number] DOB: July 18, 1951 PCP: Lucretia Kern, DO     Subjective:  No chief complaint on file.   HPI: The patient is a 67 y.o. male who presents today for annual exam. {He/she (caps):30048} denies any changes to past medical history. There have been no recent hospitalizations. They {Actions; are/are not:16769} following a well balanced diet and exercise plan. Weight has been {trend:16658}. No complaints today.   Immunization History  Administered Date(s) Administered  . Influenza Split 09/19/2012  . Influenza Whole 08/30/2007, 07/11/2010  . Influenza, High Dose Seasonal PF 06/23/2018  . Influenza,inj,Quad PF,6+ Mos 07/18/2013, 06/28/2015  . Influenza-Unspecified 08/25/2014, 07/07/2016, 06/26/2017  . Pneumococcal Conjugate-13 01/11/2018  . Td 04/28/2007, 09/09/2017  . Zoster 03/22/2013   Colonoscopy: Mammogram:  Pap smear:  PSA:   Review of Systems  Allergies Patient has No Known Allergies.  Past Medical History Patient  has a past medical history of Arthritis, Cataract, Colon polyps, Diverticulitis, GERD (gastroesophageal reflux disease), Hyperlipidemia, and Hypertension.  Surgical History Patient  has a past surgical history that includes Appendectomy; Colonoscopy; Polypectomy; and Pre-malignant / benign skin lesion excision.  Family History Pateint's family history includes Heart disease in his father.  Social History Patient  reports that he has never smoked. He has never used smokeless tobacco. He reports that he drinks about 7.0 standard drinks of alcohol per week. He reports that he does not use drugs.    Objective: There were no vitals filed for this visit.  There is no height or weight on file to calculate BMI.  Physical Exam     Assessment/plan:   No problem-specific Assessment & Plan notes found for this encounter.    No follow-ups on file.     Orma Flaming, MD [place]  07/07/2018

## 2018-07-07 NOTE — Progress Notes (Deleted)
Phone: [phone]  Subjective:  Patient presents today for their Welcome to Medicare Exam    Preventive Screening-Counseling & Management  Vision screen: *** No exam data present  Advanced directives: ***  Smoking Status: ***Never Smoker Second Hand Smoking status: ***No smokers in home  Risk Factors Regular exercise: *** Diet: ***  Fall Risk: ***None  Fall Risk  01/20/2018 12/13/2017 11/20/2016 10/29/2016  Falls in the past year? No No Yes No  Number falls in past yr: - - 1 -  Injury with Fall? - - Yes -  Risk for fall due to : - - Impaired mobility;Impaired balance/gait;Other (Comment) Other (Comment)   Opioid use history: *** no long term opioids use  Cardiac risk factors:  advanced age (older than 92 for men, 96 for women) *** Hyperlipidemia *** No diabetes. *** Family History: ***   Depression Screen None. PHQ2 0 *** Depression screen [facility] 2/9 11/20/2016 10/29/2016  Decreased Interest 0 0  Down, Depressed, Hopeless 0 0  PHQ - 2 Score 0 0  Altered sleeping 0 -  Tired, decreased energy 0 -  Change in appetite 0 -  Feeling bad or failure about yourself  0 -  Trouble concentrating 0 -  Moving slowly or fidgety/restless 0 -  Suicidal thoughts 0 -  PHQ-9 Score 0 -  Difficult doing work/chores Not difficult at all -    Activities of Daily Living Independent ADLs and IADLs ***  Hearing Difficulties: ***-patient declines  Cognitive Testing No reported trouble.  ***  Normal 3 word recall  List the Names of Other Physician/Practitioners you currently use: -*** -***  Immunization History  Administered Date(s) Administered  . Influenza Split 09/19/2012  . Influenza Whole 08/30/2007, 07/11/2010  . Influenza, High Dose Seasonal PF 06/23/2018  . Influenza,inj,Quad PF,6+ Mos 07/18/2013, 06/28/2015  . Influenza-Unspecified 08/25/2014, 07/07/2016, 06/26/2017  . Pneumococcal Conjugate-13 01/11/2018  . Td 04/28/2007, 09/09/2017  . Zoster 03/22/2013    Required Immunizations needed today ***  Screening tests- up to date There are no preventive care reminders to display for this patient.  ROS- No pertinent positives discovered in course of AWV  The following were reviewed and entered/updated in epic: Past Medical History:  Diagnosis Date  . Arthritis    L3-L4  . Cataract   . Colon polyps   . Diverticulitis   . GERD (gastroesophageal reflux disease)   . Hyperlipidemia   . Hypertension    Patient Active Problem List   Diagnosis Date Noted  . Contusion of right hand 10/01/2017  . Lumbar facet arthropathy 11/20/2016  . Lumbar degenerative disc disease 11/20/2016  . Chronic right-sided low back pain without sciatica 11/20/2016  . Reflex sympathetic dystrophy 10/29/2016  . Insomnia 03/14/2014  . Knee pain - sees ortho 03/14/2014  . Hyperlipemia 08/31/2007  . Essential hypertension 08/31/2007   Past Surgical History:  Procedure Laterality Date  . APPENDECTOMY    . COLONOSCOPY    . POLYPECTOMY    . PRE-MALIGNANT / BENIGN SKIN LESION EXCISION      Family History  Problem Relation Age of Onset  . Heart disease Father   . Colon cancer Neg Hx   . Pancreatic cancer Neg Hx   . Rectal cancer Neg Hx   . Stomach cancer Neg Hx   . Esophageal cancer Neg Hx     Medications- reviewed and updated Current Outpatient Medications  Medication Sig Dispense Refill  . amLODipine (NORVASC) 5 MG tablet Take 1 tablet (5 mg total) by mouth daily. [place]  tablet 1  . aspirin 81 MG EC tablet Take 81 mg by mouth daily.      . bisoprolol (ZEBETA) 5 MG tablet Take 1 tablet (5 mg total) by mouth daily. 90 tablet 1  . cholecalciferol (VITAMIN D) 1000 UNITS tablet Take 1,000 Units by mouth daily.    . Flaxseed, Linseed, (FLAXSEED OIL PO) Take by mouth as directed.    Marland Kitchen MAGNESIUM PO Take by mouth daily.    . methocarbamol (ROBAXIN) 500 MG tablet Take 1 tablet (500 mg total) by mouth daily. 30 tablet 2  . Multiple Vitamin (MULTIVITAMIN) tablet Take 1  tablet by mouth daily.      . Omega-3 Fatty Acids (FISH OIL PO) Take by mouth daily.    . Probiotic Product (PROBIOTIC PO) Take by mouth daily.    . rosuvastatin (CRESTOR) 20 MG tablet Take 1 tablet (20 mg total) by mouth daily. 90 tablet 1  . zaleplon (SONATA) 5 MG capsule Take 1 capsule (5 mg total) by mouth at bedtime as needed for sleep. 10 capsule 0   Current Facility-Administered Medications  Medication Dose Route Frequency Provider Last Rate Last Dose  . 0.9 %  sodium chloride infusion  500 mL Intravenous Once Ladene Artist, MD        Allergies-reviewed and updated No Known Allergies  Social History   Socioeconomic History  . Marital status: Married    Spouse name: Not on file  . Number of children: 0  . Years of education: Not on file  . Highest education level: Not on file  Occupational History  . Occupation: Retired  Scientific laboratory technician  . Financial resource strain: Not on file  . Food insecurity:    Worry: Not on file    Inability: Not on file  . Transportation needs:    Medical: Not on file    Non-medical: Not on file  Tobacco Use  . Smoking status: Never Smoker  . Smokeless tobacco: Never Used  Substance and Sexual Activity  . Alcohol use: Yes    Alcohol/week: 7.0 standard drinks    Types: 7 Glasses of wine per week  . Drug use: No  . Sexual activity: Yes  Lifestyle  . Physical activity:    Days per week: Not on file    Minutes per session: Not on file  . Stress: Not on file  Relationships  . Social connections:    Talks on phone: Not on file    Gets together: Not on file    Attends religious service: Not on file    Active member of club or organization: Not on file    Attends meetings of clubs or organizations: Not on file    Relationship status: Not on file  Other Topics Concern  . 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 Gen: NAD, resting comfortably HEENT: Mucous membranes are moist. Oropharynx normal Neck: no thyromegaly CV: RRR no murmurs rubs or gallops Lungs: CTAB no crackles, wheeze, rhonchi Abdomen: soft/nontender/nondistended/normal bowel sounds. No rebound or guarding.  Ext: no edema Skin: warm, dry Neuro: grossly  normal, moves all extremities, PERRLA  Assessment/Plan:  Welcome to Medicare exam completed- discussed recommended screenings anddocumented any personalized health advice and referrals for preventive counseling. See AVS as well which was given to patient.   Status of chronic or acute concerns  ***  No problem-specific Assessment & Plan notes found for this encounter.   Future Appointments  Date Time Provider [place]  07/08/2018  1:15 PM Orma Flaming, MD [facility]  08/16/2018  2:30 PM Lucretia Kern, DO [facility]   No follow-ups on file.   Lab/Order associations: No diagnosis found.  No orders of the defined types were placed in this encounter.   Return precautions advised. Clearnce Sorrel Ryeleigh Santore, CMA

## 2018-07-08 ENCOUNTER — Encounter: Payer: Self-pay | Admitting: Family Medicine

## 2018-07-08 ENCOUNTER — Ambulatory Visit (INDEPENDENT_AMBULATORY_CARE_PROVIDER_SITE_OTHER): Payer: Medicare Other | Admitting: Family Medicine

## 2018-07-08 VITALS — BP 130/80 | HR 70 | Temp 98.2°F | Ht 67.75 in | Wt 180.8 lb

## 2018-07-08 DIAGNOSIS — G4709 Other insomnia: Secondary | ICD-10-CM

## 2018-07-08 DIAGNOSIS — G4700 Insomnia, unspecified: Secondary | ICD-10-CM

## 2018-07-08 DIAGNOSIS — I10 Essential (primary) hypertension: Secondary | ICD-10-CM

## 2018-07-08 MED ORDER — ZOLPIDEM TARTRATE 5 MG PO TABS: 5.0000 mg | ORAL_TABLET | Freq: Every evening | ORAL | 0 refills | Status: DC | PRN

## 2018-07-08 MED ORDER — BISOPROLOL FUMARATE 5 MG PO TABS: 5.0000 mg | ORAL_TABLET | Freq: Every day | ORAL | 3 refills | Status: DC

## 2018-07-08 NOTE — Progress Notes (Signed)
Patient: Clinton Newton MRN: [id number] DOB: Feb 26, 1951 PCP: Orma Flaming, MD     Subjective:  Chief Complaint  Patient presents with  . Medication Refill  . Travel Consult    HPI: The patient is a 67 y.o. male who presents today for med refill for his zebeta. He is also on norvasc. He just saw his previous pcp who did labs/exam. No labs needed today. Blood pressure well controlled.   He is also interested in getting something for sleep when he travels to [place] once/year. He states once he is there he will take something to help with  Jet lag. Has failed melatonin, benadryl and sonata. Was interested in something else that would help him for traveling only.     Review of Systems  Constitutional: Negative for fatigue.  Respiratory: Negative for shortness of breath.   Cardiovascular: Negative for chest pain.  Gastrointestinal: Negative for abdominal pain and nausea.  Musculoskeletal: Positive for back pain. Negative for neck pain.  Neurological: Negative for dizziness and headaches.  Psychiatric/Behavioral: Negative for sleep disturbance.    Allergies Patient has No Known Allergies.  Past Medical History Patient  has a past medical history of Arthritis, Cataract, Colon polyps, Diverticulitis, GERD (gastroesophageal reflux disease), Hyperlipidemia, and Hypertension.  Surgical History Patient  has a past surgical history that includes Appendectomy; Colonoscopy; Polypectomy; and Pre-malignant / benign skin lesion excision.  Family History Pateint's family history includes Heart disease in his father.  Social History Patient  reports that he has never smoked. He has never used smokeless tobacco. He reports that he drinks about 7.0 standard drinks of alcohol per week. He reports that he does not use drugs.    Objective: Vitals:   07/08/18 1327  BP: 130/80  Pulse: 70  Temp: 98.2 F (36.8 C)  TempSrc: Oral  SpO2: 95%  Weight: 180 lb 12.8 oz (82 kg)  Height: 5' 7.75"  (1.721 m)    Body mass index is 27.69 kg/m.  Physical Exam  Constitutional: He is oriented to person, place, and time. He appears well-developed and well-nourished.  HENT:  Right Ear: External ear normal.  Left Ear: External ear normal.  Nose: Nose normal.  Mouth/Throat: Oropharynx is clear and moist.  Neck: Normal range of motion. Neck supple. No thyromegaly present.  Cardiovascular: Normal rate, regular rhythm, normal heart sounds and intact distal pulses.  Pulmonary/Chest: Effort normal and breath sounds normal.  Abdominal: Soft. Bowel sounds are normal. He exhibits no distension. There is no tenderness.  Lymphadenopathy:    He has no cervical adenopathy.  Neurological: He is alert and oriented to person, place, and time.  Vitals reviewed.      Assessment/plan: 1. Essential hypertension Blood pressure to goal. Refilled medication. F/u in 3 months for routine 6 month f/u with labs.   2. Secondary insomnia Failed melatonin/benadryl and sonata. Only uses with travel. Discussed he failed sonata, but we can try [place]. Discussed risks of this in people over 35 years of age and dosage is 5mg . If he is only using when he travels, we can do this for him. Dicussed side effects of ambien and fall risk. He understands his risk and would still like to see if this helps when he is traveling.       Return in about 3 months (around 10/07/2018) for htn/chronic check up .    Orma Flaming, MD [place]   07/08/2018

## 2018-08-01 ENCOUNTER — Encounter: Payer: Self-pay | Admitting: Family Medicine

## 2018-08-05 ENCOUNTER — Other Ambulatory Visit: Payer: Self-pay | Admitting: Physical Medicine & Rehabilitation

## 2018-08-16 ENCOUNTER — Encounter: Payer: Medicare Other | Admitting: Family Medicine

## 2018-09-16 ENCOUNTER — Other Ambulatory Visit: Payer: Self-pay | Admitting: Family Medicine

## 2018-09-16 ENCOUNTER — Encounter: Payer: Self-pay | Admitting: Family Medicine

## 2018-09-16 ENCOUNTER — Other Ambulatory Visit: Payer: Self-pay

## 2018-09-16 MED ORDER — ROSUVASTATIN CALCIUM 20 MG PO TABS: 20.0000 mg | ORAL_TABLET | Freq: Every day | ORAL | 1 refills | Status: DC

## 2018-09-19 ENCOUNTER — Encounter: Payer: Self-pay | Admitting: Family Medicine

## 2018-09-28 ENCOUNTER — Telehealth: Payer: Self-pay

## 2018-09-28 ENCOUNTER — Encounter: Payer: Self-pay | Admitting: Family Medicine

## 2018-09-28 ENCOUNTER — Ambulatory Visit (INDEPENDENT_AMBULATORY_CARE_PROVIDER_SITE_OTHER): Payer: Medicare Other | Admitting: Family Medicine

## 2018-09-28 VITALS — BP 150/82 | HR 72 | Temp 98.3°F | Ht 67.75 in | Wt 182.2 lb

## 2018-09-28 DIAGNOSIS — I10 Essential (primary) hypertension: Secondary | ICD-10-CM

## 2018-09-28 LAB — COMPREHENSIVE METABOLIC PANEL
ALBUMIN: 5.2 g/dL (ref 3.5–5.2)
ALT: 40 U/L (ref 0–53)
AST: 43 U/L — AB (ref 0–37)
Alkaline Phosphatase: 57 U/L (ref 39–117)
BILIRUBIN TOTAL: 0.6 mg/dL (ref 0.2–1.2)
BUN: 14 mg/dL (ref 6–23)
CALCIUM: 9.9 mg/dL (ref 8.4–10.5)
CHLORIDE: 104 meq/L (ref 96–112)
CO2: 29 meq/L (ref 19–32)
CREATININE: 0.62 mg/dL (ref 0.40–1.50)
GFR: 137.97 mL/min (ref >60.00)
Glucose, Bld: 106 mg/dL — ABNORMAL HIGH (ref 70–99)
Potassium: 4.3 mEq/L (ref 3.5–5.1)
SODIUM: 144 meq/L (ref 135–145)
Total Protein: 8 g/dL (ref 6.0–8.3)

## 2018-09-28 LAB — CBC WITH DIFFERENTIAL/PLATELET
BASOS ABS: 0 10*3/uL (ref 0.0–0.1)
BASOS PCT: 0.6 % (ref 0.0–3.0)
EOS ABS: 0.1 10*3/uL (ref 0.0–0.7)
Eosinophils Relative: 2.1 % (ref 0.0–5.0)
HCT: 49.2 % (ref 39.0–52.0)
Hemoglobin: 17.1 g/dL — ABNORMAL HIGH (ref 13.0–17.0)
LYMPHS ABS: 1.1 10*3/uL (ref 0.7–4.0)
Lymphocytes Relative: 26.9 % (ref 12.0–46.0)
MCHC: 34.8 g/dL (ref 30.0–36.0)
MCV: 96.5 fl (ref 78.0–100.0)
Monocytes Absolute: 0.5 10*3/uL (ref 0.1–1.0)
Monocytes Relative: 13.7 % — ABNORMAL HIGH (ref 3.0–12.0)
NEUTROS ABS: 2.2 10*3/uL (ref 1.4–7.7)
Neutrophils Relative %: 56.7 % (ref 43.0–77.0)
PLATELETS: 194 10*3/uL (ref 150.0–400.0)
RBC: 5.1 Mil/uL (ref 4.22–5.81)
RDW: 13.3 % (ref 11.5–15.5)
WBC: 4 10*3/uL (ref 4.0–10.5)

## 2018-09-28 LAB — MICROALBUMIN / CREATININE URINE RATIO
CREATININE, U: 38 mg/dL
MICROALB UR: 2.8 mg/dL — AB (ref 0.0–1.9)
Microalb Creat Ratio: 7.4 mg/g (ref 0.0–30.0)

## 2018-09-28 NOTE — Patient Instructions (Signed)
Stop the beta blocker and watch your heart rate/blood pressure for a week or two. If heart rate is good, but blood pressure is up I would increase your norvasc to 10mg  daily.   If heart rate is over 90 consistently we may need to start back the beta blocker. Keep a log for me.

## 2018-09-28 NOTE — Progress Notes (Signed)
Patient: Clinton Newton MRN: [id number] DOB: 11-02-1950 PCP: Orma Flaming, MD     Subjective:  Chief Complaint  Patient presents with  . Hypertension    3 mo follow up    HPI: The patient is a 67 y.o. male who presents today for blood pressure follow up.   Hypertension: Here for follow up of hypertension.  Currently on norvasc 5mg . Also on beta blocker that he decreased down to 2.5mg . On this for heart rate from previous physician and was started 15 years ago he thinks.  Home readings range from 789-381 OFBPZWCH/85-27 diastolic. Takes medication as prescribed and denies any side effects. Exercise includes walking. Weight has been stable. Denies any chest pain, headaches, shortness of breath, vision changes, swelling in lower extremities.   He decreased his beta blocker down to 1/2 pill. Heart rate has been no higher than upper eighties. One reading in the 90s. No symptoms.    Review of Systems  Constitutional: Positive for fatigue.  Eyes: Negative for visual disturbance.  Respiratory: Negative for shortness of breath.   Cardiovascular: Negative for chest pain.  Gastrointestinal: Negative for abdominal pain and nausea.  Musculoskeletal: Negative for back pain and neck pain.  Neurological: Negative for dizziness and headaches.    Allergies Patient has No Known Allergies.  Past Medical History Patient  has a past medical history of Arthritis, Cataract, Colon polyps, Diverticulitis, GERD (gastroesophageal reflux disease), Hyperlipidemia, and Hypertension.  Surgical History Patient  has a past surgical history that includes Appendectomy; Colonoscopy; Polypectomy; and Pre-malignant / benign skin lesion excision.  Family History Pateint's family history includes Heart disease in his father.  Social History Patient  reports that he has never smoked. He has never used smokeless tobacco. He reports that he drinks about 7.0 standard drinks of alcohol per week. He reports that he  does not use drugs.    Objective: Vitals:   09/28/18 1259 09/28/18 1306 09/28/18 1317  BP: (!) 146/92 138/90 (!) 150/82  Pulse: 72    Temp: 98.3 F (36.8 C)    TempSrc: Oral    SpO2: 96%    Weight: 182 lb 3.2 oz (82.6 kg)    Height: 5' 7.75" (1.721 m)      Body mass index is 27.91 kg/m.  Physical Exam  Constitutional: He is oriented to person, place, and time. He appears well-developed and well-nourished.  Neck: Normal range of motion. Neck supple.  Cardiovascular: Normal rate, regular rhythm and normal heart sounds.  Pulmonary/Chest: Effort normal and breath sounds normal.  Abdominal: Soft. Bowel sounds are normal.  Neurological: He is alert and oriented to person, place, and time.  Vitals reviewed.      Depression screen [facility] 2/9 09/28/2018 11/20/2016 10/29/2016  Decreased Interest 0 0 0  Down, Depressed, Hopeless 0 0 0  PHQ - 2 Score 0 0 0  Altered sleeping - 0 -  Tired, decreased energy - 0 -  Change in appetite - 0 -  Feeling bad or failure about yourself  - 0 -  Trouble concentrating - 0 -  Moving slowly or fidgety/restless - 0 -  Suicidal thoughts - 0 -  PHQ-9 Score - 0 -  Difficult doing work/chores - Not difficult at all -    Assessment/plan: 1. Essential hypertension We are going to wean him off his beta blocker completely, but he is going to keep a spread sheet on his heart rate and let me know if goes over 90. Also his home blood pressure readings are  all to goal. Will continue to have him monitor this at home; however, if this starts to creep up and is consistently over 140/90 we need to increase his norvasc to 10mg . Will have him bring his blood pressure cuff to next appointment. Will see him back in 3 months for closer follow up since im adjusting meds and stopping beta blocker. Routine lab work today and no refills given.  - Microalbumin / creatinine urine ratio - Comprehensive metabolic panel - CBC with Differential/Platelet      Return in about 3  months (around 12/28/2018) for htn/chol. Orma Flaming, MD [place]   09/28/2018

## 2018-09-28 NOTE — Telephone Encounter (Signed)
Called and left detailed voicemail on patient's cell phone.  He has an appt scheduled for 1pm today for a flu shot.  Upon review of his chart, he has already received his high dose flu vaccine on 8/29.  I advised pt if he has another issue that he needs to be seen for, he should keep his appt, otherwise he does not need to be seen today.

## 2018-09-29 ENCOUNTER — Other Ambulatory Visit: Payer: Self-pay | Admitting: Family Medicine

## 2018-09-29 ENCOUNTER — Other Ambulatory Visit (INDEPENDENT_AMBULATORY_CARE_PROVIDER_SITE_OTHER): Payer: Medicare Other

## 2018-09-29 DIAGNOSIS — R7309 Other abnormal glucose: Secondary | ICD-10-CM

## 2018-09-29 LAB — HEMOGLOBIN A1C: Hgb A1c MFr Bld: 5.2 % (ref 4.6–6.5)

## 2018-10-01 ENCOUNTER — Encounter: Payer: Self-pay | Admitting: Family Medicine

## 2018-10-03 ENCOUNTER — Encounter: Payer: Self-pay | Admitting: Family Medicine

## 2018-10-05 ENCOUNTER — Other Ambulatory Visit: Payer: Self-pay | Admitting: Family Medicine

## 2018-10-05 MED ORDER — ZOLPIDEM TARTRATE 5 MG PO TABS: 5.0000 mg | ORAL_TABLET | Freq: Every evening | ORAL | 0 refills | Status: DC | PRN

## 2018-10-07 ENCOUNTER — Ambulatory Visit: Payer: Medicare Other | Admitting: Family Medicine

## 2018-10-25 ENCOUNTER — Encounter: Payer: Self-pay | Admitting: Family Medicine

## 2018-11-02 ENCOUNTER — Ambulatory Visit: Payer: Self-pay

## 2018-11-02 ENCOUNTER — Encounter: Payer: Self-pay | Admitting: Sports Medicine

## 2018-11-02 ENCOUNTER — Ambulatory Visit (INDEPENDENT_AMBULATORY_CARE_PROVIDER_SITE_OTHER): Payer: Medicare Other | Admitting: Sports Medicine

## 2018-11-02 VITALS — BP 130/82 | HR 82 | Ht 67.75 in | Wt 177.4 lb

## 2018-11-02 DIAGNOSIS — R269 Unspecified abnormalities of gait and mobility: Secondary | ICD-10-CM | POA: Diagnosis not present

## 2018-11-02 DIAGNOSIS — M25871 Other specified joint disorders, right ankle and foot: Secondary | ICD-10-CM

## 2018-11-02 DIAGNOSIS — M7741 Metatarsalgia, right foot: Secondary | ICD-10-CM

## 2018-11-02 DIAGNOSIS — M79671 Pain in right foot: Secondary | ICD-10-CM

## 2018-11-02 NOTE — Procedures (Signed)
PROCEDURE NOTE:  Ultrasound Guided: Injection: Right ankle Images were obtained and interpreted by myself, Teresa Coombs, DO  Images have been saved and stored to PACS system. Images obtained on: GE S7 Ultrasound machine    ULTRASOUND FINDINGS:  Lateral ankle is a small amount of swelling soft tissue prominence consistent with ankle impingement.  DESCRIPTION OF PROCEDURE:  The patient's clinical condition is marked by substantial pain and/or significant functional disability. Other conservative therapy has not provided relief, is contraindicated, or not appropriate. There is a reasonable likelihood that injection will significantly improve the patient's pain and/or functional impairment.   After discussing the risks, benefits and expected outcomes of the injection and all questions were reviewed and answered, the patient wished to undergo the above named procedure.  Verbal consent was obtained.  The ultrasound was used to identify the target structure and adjacent neurovascular structures. The skin was then prepped in sterile fashion and the target structure was injected under direct visualization using sterile technique as below:  Single injection performed as below: PREP: Alcohol and Ethel Chloride APPROACH:anteriolateral, single injection, 25g 1.5 in. INJECTATE: 0.5 cc 1% lidocaine, 0.5 cc 0.5% Marcaine and 0.5 cc 40mg /mL DepoMedrol ASPIRATE: None DRESSING: Band-Aid  Post procedural instructions including recommending icing and warning signs for infection were reviewed.    This procedure was well tolerated and there were no complications.   IMPRESSION: Succesful Ultrasound Guided: Injection

## 2018-11-02 NOTE — Progress Notes (Signed)
Clinton Newton. [place], [name] at [facility] [phone]  Clinton Newton - 68 y.o. male MRN [id number]  Date of birth: 1950-12-15  Visit Date:   PCP: Orma Flaming, MD   Referred by: Orma Flaming, MD   SUBJECTIVE:  Chief Complaint  Patient presents with  . R foot pain    Sx x several weeks. Pain is lateral. Worse with inversion, rotating ankle. No swelling. No meds or other modalities. No recent imaging. No recently injury to the foot. Sprained R ankle 4 years ago, saw Dr. Oneida Alar.     HPI: Patient presents with the above symptoms.  He has been seen for foot and ankle issues before by Dr. Oneida Alar and has had custom cushioned insoles as well as metatarsal pads added to his shoes.  The lateral ankle pain is come up over the past several weeks and is worse with terminal inversion.  He had a significant ankle injury 4 years ago while in [place].  Had been doing well up until the last several months and thinks this may be related to increasing his physical activity level.  REVIEW OF SYSTEMS: 12 point review of systems reviewed and is negative.  HISTORY:  Prior history reviewed and updated per electronic medical record.  Social History   Occupational History  . Occupation: Retired  Tobacco Use  . Smoking status: Never Smoker  . Smokeless tobacco: Never Used  Substance and Sexual Activity  . Alcohol use: Yes    Alcohol/week: 7.0 standard drinks    Types: 7 Glasses of wine per week  . Drug use: No  . 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 No specialty comments available.  OBJECTIVE:  VS:  HT:5' 7.75" (172.1 cm)   WT:177 lb 6.4 oz (80.5 kg)  BMI:27.17    BP:130/82  HR:82bpm  TEMP: ( )  RESP:95 %   PHYSICAL EXAM: Adult male.  No acute distress.  Alert and appropriate.  He is a markedly high cavus rigid foot and pain over the anterior lateral ankle.  Ankle drawer testing is stable.  He does have pain with passive impingement testing.  Ankle drawer testing is stable.  Mild pain with talar tilt and no pain with kleiger testing.  Normal cotton testing.  Intrinsic foot and ankle motion and strength is 5+/5.  He does have a slight pronation with weightbearing.   ASSESSMENT  1. Right foot pain   2. Ankle impingement syndrome, right   3. Metatarsalgia of right foot   4. Gait disturbance     PLAN:  Pertinent additional documentation may be included in corresponding procedure notes, imaging studies, problem based documentation and patient instructions.  Procedures:  US Guided Injection per procedure note  Medications:  No  orders of the defined types were placed in this encounter.  Discussion/Instructions: No problem-specific Assessment & Plan notes found for this encounter. He is a high rigid cavus foot and overpronation gait that is likely contributing to the ankle impingement syndrome he has.  Ultrasound-guided injection should do well today.  He is going to be getting new shoes coming days and if he reports incomplete relief with new shoes and his prior cushioned insoles from Dr. Oneida Alar we will consider repeating fabrication for custom insoles at follow-up that he will call us for. Could consider longitudinal metatarsal pad as well at follow-up.    Return if symptoms worsen or fail to improve.          Gerda Diss, [place] [facility] Physician

## 2018-11-02 NOTE — Patient Instructions (Signed)

## 2018-11-10 ENCOUNTER — Encounter: Payer: Self-pay | Admitting: Family Medicine

## 2018-11-10 ENCOUNTER — Other Ambulatory Visit: Payer: Self-pay | Admitting: Physical Medicine & Rehabilitation

## 2018-11-11 ENCOUNTER — Other Ambulatory Visit: Payer: Self-pay | Admitting: Family Medicine

## 2018-11-11 MED ORDER — AMLODIPINE BESYLATE 10 MG PO TABS: 10.0000 mg | ORAL_TABLET | Freq: Every day | ORAL | 3 refills | Status: DC

## 2018-11-14 ENCOUNTER — Encounter: Payer: Self-pay | Admitting: Family Medicine

## 2018-12-11 ENCOUNTER — Other Ambulatory Visit: Payer: Self-pay | Admitting: Family Medicine

## 2018-12-16 ENCOUNTER — Encounter: Payer: Self-pay | Admitting: Family Medicine

## 2018-12-16 ENCOUNTER — Ambulatory Visit (INDEPENDENT_AMBULATORY_CARE_PROVIDER_SITE_OTHER): Payer: Medicare Other | Admitting: Family Medicine

## 2018-12-16 VITALS — BP 130/70 | HR 70 | Temp 98.1°F | Ht 67.75 in | Wt 177.0 lb

## 2018-12-16 DIAGNOSIS — R748 Abnormal levels of other serum enzymes: Secondary | ICD-10-CM

## 2018-12-16 DIAGNOSIS — Z125 Encounter for screening for malignant neoplasm of prostate: Secondary | ICD-10-CM

## 2018-12-16 DIAGNOSIS — I10 Essential (primary) hypertension: Secondary | ICD-10-CM

## 2018-12-16 DIAGNOSIS — R002 Palpitations: Secondary | ICD-10-CM

## 2018-12-16 LAB — COMPREHENSIVE METABOLIC PANEL
ALBUMIN: 5 g/dL (ref 3.5–5.2)
ALT: 28 U/L (ref 0–53)
AST: 24 U/L (ref 0–37)
Alkaline Phosphatase: 61 U/L (ref 39–117)
BUN: 14 mg/dL (ref 6–23)
CO2: 32 mEq/L (ref 19–32)
Calcium: 10 mg/dL (ref 8.4–10.5)
Chloride: 105 mEq/L (ref 96–112)
Creatinine, Ser: 0.67 mg/dL (ref 0.40–1.50)
GFR: 118.62 mL/min (ref >60.00)
Glucose, Bld: 98 mg/dL (ref 70–99)
Potassium: 4.1 mEq/L (ref 3.5–5.1)
Sodium: 146 mEq/L — ABNORMAL HIGH (ref 135–145)
TOTAL PROTEIN: 7.3 g/dL (ref 6.0–8.3)
Total Bilirubin: 0.6 mg/dL (ref 0.2–1.2)

## 2018-12-16 LAB — PSA, MEDICARE: PSA: 2.95 ng/ml (ref 0.10–4.00)

## 2018-12-16 MED ORDER — ZOLPIDEM TARTRATE 5 MG PO TABS: 5.0000 mg | ORAL_TABLET | Freq: Every evening | ORAL | 1 refills | Status: DC | PRN

## 2018-12-16 NOTE — Patient Instructions (Signed)
Will order heart monitor x 1 week to make sure you are not having any irregular heart rhythm. They will call you to set this up. Let me know if you don't hear anything.   -hold off on beta blocker until we get heart test back.   -checking liver/psa today  -sending in [place]. I can fill your robaxin.

## 2018-12-16 NOTE — Progress Notes (Signed)
Patient: Clinton Newton MRN: [id number] DOB: 06-03-1951 PCP: Orma Flaming, MD     Subjective:  Chief Complaint  Patient presents with  . Hypertension    HPI: The patient is a 68 y.o. male who presents today for blood pressure follow up.   Hypertension: Here for follow up of hypertension.  Currently on norvasc 10mg . Home readings range from 440 [id number]/63 diastolic. Takes medication as prescribed and denies any side effects. Exercise includes walking/water aerobics twice a week and gym.  Weight has been stable. Denies any chest pain, headaches, shortness of breath, vision changes, swelling in lower extremities.  He has his BP cuff with him today. He has a wrist cuff and it's not as accurate, reading much lower than what we are getting.   He does have concerns as his heart rate has been high at home. He states the highest was 115. Seems to be hovering around 90. He does sit and relax for 10 minutes before checking it. He states it makes him nervous that his heart rate is high. He was on a beta blocker previously for this. No dizziness or lightheadedness. No chest pain or shortness of breath.   Also requesting his psa to be tested.   130/70. Has beta blocker at home if we need to start this.   Review of Systems  Constitutional: Negative for activity change, appetite change and fatigue.  Eyes: Negative for visual disturbance.  Respiratory: Negative for shortness of breath.   Cardiovascular: Negative for chest pain and palpitations.  Gastrointestinal: Negative for abdominal pain and nausea.  Neurological: Negative for dizziness and headaches.  Psychiatric/Behavioral: Negative for sleep disturbance.    Allergies Patient has No Known Allergies.  Past Medical History Patient  has a past medical history of Arthritis, Cataract, Colon polyps, Diverticulitis, GERD (gastroesophageal reflux disease), Hyperlipidemia, and Hypertension.  Surgical History Patient  has a past surgical history  that includes Appendectomy; Colonoscopy; Polypectomy; and Pre-malignant / benign skin lesion excision.  Family History Pateint's family history includes Heart disease in his father.  Social History Patient  reports that he has never smoked. He has never used smokeless tobacco. He reports current alcohol use of about 7.0 standard drinks of alcohol per week. He reports that he does not use drugs.    Objective: Vitals:   12/16/18 1421 12/16/18 1517  BP: 136/82 130/70  Pulse: 70   Temp: 98.1 F (36.7 C)   TempSrc: Oral   SpO2: 96%   Weight: 177 lb (80.3 kg)   Height: 5' 7.75" (1.721 m)     Body mass index is 27.11 kg/m.  Physical Exam Vitals signs reviewed.  Constitutional:      Appearance: Normal appearance.  Neck:     Musculoskeletal: Normal range of motion and neck supple.  Cardiovascular:     Rate and Rhythm: Normal rate and regular rhythm.     Heart sounds: Normal heart sounds.  Pulmonary:     Effort: Pulmonary effort is normal.     Breath sounds: Normal breath sounds.  Abdominal:     General: Abdomen is flat. Bowel sounds are normal.     Palpations: Abdomen is soft.  Neurological:     General: No focal deficit present.     Mental Status: He is alert and oriented to person, place, and time.        Assessment/plan: 1. Essential hypertension Blood pressure to goal. Would not change current medication. Continue norvasc 10mg  daily. Doing well blood pressure wise off beta blocker. Continue  log and he is going to get a new cuff that is not a wrist cuff.   2. Palpitations Heart rate normal today and seems pretty normal. He is very nervous about this and wants to make sure no abnormal rhythm. Will do zio x 7 days. Hold off on beta blocker until this is done.  - LONG TERM MONITOR (3-14 DAYS); Future  3. Elevated liver enzymes  - Comprehensive metabolic panel; Future - Comprehensive metabolic panel  4. Prostate cancer screening  - PSA, Medicare; Future - PSA,  Medicare   Return if symptoms worsen or fail to improve, for ill let you know when you need to come back once i get test back for heart. Orma Flaming, MD [place]   12/16/2018

## 2018-12-27 ENCOUNTER — Ambulatory Visit: Payer: Medicare Other | Admitting: Family Medicine

## 2018-12-28 ENCOUNTER — Ambulatory Visit (INDEPENDENT_AMBULATORY_CARE_PROVIDER_SITE_OTHER): Payer: Medicare Other

## 2018-12-28 DIAGNOSIS — H5213 Myopia, bilateral: Secondary | ICD-10-CM | POA: Diagnosis not present

## 2018-12-28 DIAGNOSIS — R002 Palpitations: Secondary | ICD-10-CM

## 2018-12-28 DIAGNOSIS — H25043 Posterior subcapsular polar age-related cataract, bilateral: Secondary | ICD-10-CM | POA: Diagnosis not present

## 2019-01-05 ENCOUNTER — Ambulatory Visit (INDEPENDENT_AMBULATORY_CARE_PROVIDER_SITE_OTHER): Payer: Medicare Other | Admitting: Family Medicine

## 2019-01-05 ENCOUNTER — Encounter: Payer: Self-pay | Admitting: Family Medicine

## 2019-01-05 ENCOUNTER — Other Ambulatory Visit: Payer: Self-pay

## 2019-01-05 ENCOUNTER — Other Ambulatory Visit: Payer: Self-pay | Admitting: Physical Medicine & Rehabilitation

## 2019-01-05 VITALS — BP 138/90 | HR 75 | Temp 97.7°F | Ht 67.75 in | Wt 177.6 lb

## 2019-01-05 DIAGNOSIS — F418 Other specified anxiety disorders: Secondary | ICD-10-CM | POA: Diagnosis not present

## 2019-01-05 DIAGNOSIS — R002 Palpitations: Secondary | ICD-10-CM

## 2019-01-05 DIAGNOSIS — R0789 Other chest pain: Secondary | ICD-10-CM | POA: Diagnosis not present

## 2019-01-05 MED ORDER — HYDROXYZINE HCL 25 MG PO TABS: ORAL_TABLET | ORAL | 1 refills | Status: DC

## 2019-01-05 NOTE — Progress Notes (Signed)
Patient: Clinton Newton MRN: [id number] DOB: October 17, 1951 PCP: Orma Flaming, MD     Subjective:  Chief Complaint  Patient presents with  . Anxiety    HPI: The patient is a 68 y.o. male who presents today for anxiety and some chest pain/palpitations  . He states this is very abnormal for him. He states he thinks some of it is relating to his heart monitor that he just turned in and some if it is the virus/stock market plummet. The market is creating a lot of stress as well. Personality wise he is prepared for the worst and hopes for the best. He went to grocery store and bought a lot of stuff. The virus is causing a lot of anxiety. No panic attacks.   He feels like his heart is pumping harder than it should. He will occasionally have a sharp prick when his heart pumps. He has never had an echo. He is not short of breath or fatigued. He does snore at night. He feels like he can exercise just as much as he could before. He is just very concerned about his heart. Heart disease runs in his family. Had stress test 2016 and it was normal. Pain is central in middle of his chest. No radiation down his arm/jaw. No shortness of breath or diaphoresis. Only lasts for a few seconds. He is on statin. Does not smoke, no diabetes.   He states his heart rate is in the 90s more than he would like. im not sure why he is checking this, but he really does focus on this. He would like to go back on his beta blocker at 2.5mg .    Review of Systems  Constitutional: Negative for fatigue.  Eyes: Negative for visual disturbance.  Respiratory: Negative for cough and shortness of breath.   Cardiovascular: Positive for chest pain and palpitations. Negative for leg swelling.  Gastrointestinal: Negative for abdominal pain, constipation, diarrhea and nausea.  Neurological: Negative for dizziness and headaches.  Psychiatric/Behavioral: Negative for sleep disturbance. The patient is nervous/anxious.     Allergies Patient  has No Known Allergies.  Past Medical History Patient  has a past medical history of Arthritis, Cataract, Colon polyps, Diverticulitis, GERD (gastroesophageal reflux disease), Hyperlipidemia, and Hypertension.  Surgical History Patient  has a past surgical history that includes Appendectomy; Colonoscopy; Polypectomy; and Pre-malignant / benign skin lesion excision.  Family History Pateint's family history includes Heart disease in his father.  Social History Patient  reports that he has never smoked. He has never used smokeless tobacco. He reports current alcohol use of about 7.0 standard drinks of alcohol per week. He reports that he does not use drugs.    Objective: Vitals:   01/05/19 1044  BP: 138/90  Pulse: 75  Temp: 97.7 F (36.5 C)  TempSrc: Oral  SpO2: 96%  Weight: 177 lb 9.6 oz (80.6 kg)  Height: 5' 7.75" (1.721 m)    Body mass index is 27.2 kg/m.  Physical Exam Vitals signs reviewed.  Constitutional:      Appearance: Normal appearance.  HENT:     Nose: Nose normal.  Neck:     Musculoskeletal: Normal range of motion and neck supple.  Cardiovascular:     Rate and Rhythm: Normal rate and regular rhythm.     Heart sounds: Normal heart sounds. No murmur.  Pulmonary:     Effort: Pulmonary effort is normal.     Breath sounds: Normal breath sounds.  Abdominal:     General: Abdomen is  flat. Bowel sounds are normal.     Palpations: Abdomen is soft.  Neurological:     General: No focal deficit present.     Mental Status: He is alert and oriented to person, place, and time.  Psychiatric:        Mood and Affect: Mood normal.        Behavior: Behavior normal.        Thought Content: Thought content normal.    GAD 7 : Generalized Anxiety Score 01/05/2019  Nervous, Anxious, on Edge 1  Control/stop worrying 1  Worry too much - different things 1  Trouble relaxing 1  Restless 1  Easily annoyed or irritable 0  Afraid - awful might happen 1  Total GAD 7 Score 6         Assessment/plan: 1. Palpitations Getting echo and he can start back his low dose beta blocker. His heart rate has always been normal here and I think he is really perseverating on this. im fine if he starts this back. Can keep a log and we should have results of his zio back this week or early next week.   2. Other chest pain Atypical in nature. He is very anxious. Will get zio/echo back and then see if we need to stress him, but seems very atypical in nature at this time. Precautions given.  - ECHOCARDIOGRAM COMPLETE; Future  3. Situational anxiety GAd7 quite mild. Due to current circumstances that are out of his control. Will do hydroxyzine prn. Drowsy precautions given. Let me know if he feels like it is getting more out of hand.       Return if symptoms worsen or fail to improve.   Orma Flaming, MD [place]   01/05/2019

## 2019-01-05 NOTE — Patient Instructions (Signed)
-  getting an echo of your heart to see function -start back your beta blocker at 2.5mg  and watch heart rate.  -hydroxyzine prn for your anxiety. Watch for drowsiness.

## 2019-01-09 DIAGNOSIS — R002 Palpitations: Secondary | ICD-10-CM | POA: Diagnosis not present

## 2019-01-09 NOTE — Telephone Encounter (Signed)
Message from patient

## 2019-01-11 NOTE — Telephone Encounter (Signed)
Forwarded to patients primary care provider

## 2019-01-13 ENCOUNTER — Other Ambulatory Visit: Payer: Self-pay | Admitting: Physical Medicine & Rehabilitation

## 2019-01-13 MED ORDER — METHOCARBAMOL 500 MG PO TABS: 500.0000 mg | ORAL_TABLET | Freq: Every day | ORAL | 2 refills | Status: DC

## 2019-01-21 ENCOUNTER — Encounter: Payer: Self-pay | Admitting: Family Medicine

## 2019-02-28 ENCOUNTER — Encounter: Payer: Self-pay | Admitting: Family Medicine

## 2019-03-03 DIAGNOSIS — Z20828 Contact with and (suspected) exposure to other viral communicable diseases: Secondary | ICD-10-CM | POA: Diagnosis not present

## 2019-03-10 ENCOUNTER — Ambulatory Visit (INDEPENDENT_AMBULATORY_CARE_PROVIDER_SITE_OTHER): Payer: Medicare Other | Admitting: Family Medicine

## 2019-03-10 ENCOUNTER — Encounter: Payer: Self-pay | Admitting: Family Medicine

## 2019-03-10 VITALS — Temp 97.7°F | Ht 67.75 in | Wt 173.0 lb

## 2019-03-10 DIAGNOSIS — F418 Other specified anxiety disorders: Secondary | ICD-10-CM

## 2019-03-10 DIAGNOSIS — R002 Palpitations: Secondary | ICD-10-CM

## 2019-03-10 MED ORDER — BUSPIRONE HCL 7.5 MG PO TABS: 7.5000 mg | ORAL_TABLET | Freq: Three times a day (TID) | ORAL | 0 refills | Status: DC

## 2019-03-10 NOTE — Progress Notes (Signed)
Patient: Clinton Newton MRN: [id number] DOB: [birth year] PCP: Orma Flaming, MD     I connected with Birder Robson on 03/10/19 at 9:21am by a video enabled telemedicine application and verified that I am speaking with the correct person using two identifiers.  Location patient: Home Location provider: [facility], Office Persons participating in this virtual visit: Clinton Newton and Dr. Rogers Blocker   I discussed the limitations of evaluation and management by telemedicine and the availability of in person appointments. The patient expressed understanding and agreed to proceed.   Subjective:  Chief Complaint  Patient presents with  . Anxiety  . Hypertension    HPI: The patient is a 68 y.o. male who presents today for anxiety follow up and to get things in order before my maternity leave.   Anxiety: GAD7 was quite mild on last visit, but due to covid pandemic and his inability to control things we started him on hydroxyzine prn. He is here to f/u on this. His anxiety seems to be more situational e.g. being around elderly, public places, visiting his 67 year old mother in law, etc with the covid. This all seemed to manifest with the pandemic, although he does worry well regarding his health. He doesn't think the hydroxyzine helps at all. He feels like it's a placebo. He does feel like he needs something to help as needed.   Palpitations: we ordered an echo and he did not do this due to the covid. zio patch was ordered and normal. He started back on his 1/2 pill of his beta blocker and this has seemed to improve/resolve.   HTN: to goal, no refills needed.   Went over all medication and made sure refills intact.   Review of Systems  Constitutional: Negative for fatigue.  Eyes: Negative for visual disturbance.  Respiratory: Negative for shortness of breath.   Cardiovascular: Negative for chest pain.  Gastrointestinal: Negative for abdominal pain and nausea.  Psychiatric/Behavioral: Negative for  sleep disturbance. The patient is not nervous/anxious.     Allergies Patient has No Known Allergies.  Past Medical History Patient  has a past medical history of Arthritis, Cataract, Colon polyps, Diverticulitis, GERD (gastroesophageal reflux disease), Hyperlipidemia, and Hypertension.  Surgical History Patient  has a past surgical history that includes Appendectomy; Colonoscopy; Polypectomy; and Pre-malignant / benign skin lesion excision.  Family History Pateint's family history includes Heart disease in his father.  Social History Patient  reports that he has never smoked. He has never used smokeless tobacco. He reports current alcohol use of about 7.0 standard drinks of alcohol per week. He reports that he does not use drugs.    Objective: Vitals:   03/10/19 0851  Temp: 97.7 F (36.5 C)  TempSrc: Oral  Weight: 173 lb (78.5 kg)  Height: 5' 7.75" (1.721 m)    Body mass index is 26.5 kg/m.  Physical Exam Vitals signs reviewed.  Constitutional:      Appearance: Normal appearance.  Eyes:     Extraocular Movements: Extraocular movements intact.  Pulmonary:     Effort: Pulmonary effort is normal.  Neurological:     General: No focal deficit present.     Mental Status: He is alert and oriented to person, place, and time.  Psychiatric:        Mood and Affect: Mood normal.        Behavior: Behavior normal.        GAD 7 : Generalized Anxiety Score 03/10/2019 01/05/2019  Nervous, Anxious, on Edge 3 1  Control/stop worrying 1 1  Worry too much - different things 1 1  Trouble relaxing 1 1  Restless 1 1  Easily annoyed or irritable 0 0  Afraid - awful might happen 2 1  Total GAD 7 Score 9 6  Anxiety Difficulty Not difficult at all -     Assessment/plan: 1. Situational anxiety Hydroxyzine didn't help at all. Discussed im very hesitant to do a BZD so we will try buspar prn. Discussed how to take this medication/side effects. Will take on an as needed basis. He will  let me know how he is doing in 2-3 weeks before maternity leave. Any issues, let me know. Precautions given.   2. Palpitations Resolved/improved on beta blocker. Would still like echo, but he would like to wait until covid better.    -reviewed all medications to make sure he has refills and answered all questions.   Return if symptoms worsen or fail to improve.    Orma Flaming, MD [place]  03/10/2019

## 2019-03-22 ENCOUNTER — Encounter: Payer: Self-pay | Admitting: Family Medicine

## 2019-03-27 ENCOUNTER — Encounter: Payer: Self-pay | Admitting: Family Medicine

## 2019-03-27 ENCOUNTER — Ambulatory Visit (INDEPENDENT_AMBULATORY_CARE_PROVIDER_SITE_OTHER): Payer: Medicare Other | Admitting: Family Medicine

## 2019-03-27 VITALS — Ht 67.75 in | Wt 175.0 lb

## 2019-03-27 DIAGNOSIS — F419 Anxiety disorder, unspecified: Secondary | ICD-10-CM

## 2019-03-27 MED ORDER — SERTRALINE HCL 25 MG PO TABS: ORAL_TABLET | ORAL | 0 refills | Status: DC

## 2019-03-27 NOTE — Progress Notes (Addendum)
Patient: Clinton Newton MRN: [id number] DOB: 01-23-1951 PCP: Orma Flaming, MD     I connected with Birder Robson on 03/27/19 at 1:15pm by a video enabled telemedicine application and verified that I am speaking with the correct person using two identifiers.  Location patient: Home Location provider: [facility], Office Persons participating in this virtual visit: Oshua Mcconaha and Dr. Rogers Blocker   I discussed the limitations of evaluation and management by telemedicine and the availability of in person appointments. The patient expressed understanding and agreed to proceed.   Subjective:  Chief Complaint  Patient presents with  . Anxiety    HPI: The patient is a 68 y.o. male who presents today for anxiety follow up. We have tried him on prn medication for his anxiety including hydroxyzine and buspar. He thought they were both sugar pills or placebo pills. He saw no effect on his anxiety. We are discussing today if he would benefit from a daily medication and I do believe he would. He is onboard for this today.  GAD 7 was just done 2 weeks ago with a score of 9 and this is when buspar was started. He already worries well regarding his health issues and the covid/economy has only made his anxiety worse. He has been on valium in the past as the only prn medication for this. 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 Has been decorating stones daily and leaving them around his neighborhood.   Review of Systems  Constitutional: Negative for fatigue.  Eyes: Negative for visual disturbance.  Respiratory: Negative for shortness of breath.   Cardiovascular: Negative for chest pain.  Gastrointestinal: Negative for abdominal pain and nausea.  Neurological: Negative for dizziness and headaches.  Psychiatric/Behavioral: Negative for sleep disturbance. The patient is nervous/anxious.     Allergies Patient has No  Known Allergies.  Past Medical History Patient  has a past medical history of Arthritis, Cataract, Colon polyps, Diverticulitis, GERD (gastroesophageal reflux disease), Hyperlipidemia, and Hypertension.  Surgical History Patient  has a past surgical history that includes Appendectomy; Colonoscopy; Polypectomy; and Pre-malignant / benign skin lesion excision.  Family History Pateint's family history includes Heart disease in his father.  Social History Patient  reports that he has never smoked. He has never used smokeless tobacco. He reports current alcohol use of about 7.0 standard drinks of alcohol per week. He reports that he does not use drugs.    Objective: Vitals:   03/27/19 1308  Weight: 175 lb (79.4 kg)  Height: 5' 7.75" (1.721 m)    Body mass index is 26.81 kg/m.  Physical Exam Vitals signs reviewed.  Constitutional:      Appearance: Normal appearance.  HENT:     Head: Normocephalic and atraumatic.  Eyes:     Extraocular Movements: Extraocular movements intact.  Pulmonary:     Effort: Pulmonary effort is normal.  Neurological:     General: No focal deficit present.     Mental Status: He is alert and oriented to person, place, and time.  Psychiatric:        Mood and Affect: Mood normal.        Behavior: Behavior normal.        Assessment/plan: 1. Anxiety We are going to start him on daily medication with zoloft. Side effects discussed with him and will start him low dose at 25mg . He can titrate up to 50mg  after 3 weeks if  needed and then we will see him back for 1 month f/u. I think this will be good for him since he worries well and his anxiety has just gotten unmanageable with the covid pandemic. Any issues before let us know. Any si/hi/ah/vh he is to call 911 or go to ED.     Return in about 1 month (around 04/26/2019) for anxiety med with dr. Jonni Sanger .     Orma Flaming, MD [place]  03/27/2019

## 2019-04-17 ENCOUNTER — Encounter: Payer: Self-pay | Admitting: Family Medicine

## 2019-04-17 NOTE — Telephone Encounter (Signed)
Dr. Jonni Sanger, okay to refill medications pt requested? He saw Dr. Rogers Blocker on 03/27/2019 last.

## 2019-04-18 ENCOUNTER — Other Ambulatory Visit: Payer: Self-pay | Admitting: Family Medicine

## 2019-04-18 MED ORDER — BISOPROLOL FUMARATE 5 MG PO TABS: 2.5000 mg | ORAL_TABLET | Freq: Every day | ORAL | 3 refills | Status: DC

## 2019-04-18 MED ORDER — AMLODIPINE BESYLATE 10 MG PO TABS: 10.0000 mg | ORAL_TABLET | Freq: Every day | ORAL | 3 refills | Status: DC

## 2019-04-20 DIAGNOSIS — L821 Other seborrheic keratosis: Secondary | ICD-10-CM | POA: Diagnosis not present

## 2019-04-20 DIAGNOSIS — L814 Other melanin hyperpigmentation: Secondary | ICD-10-CM | POA: Diagnosis not present

## 2019-04-20 DIAGNOSIS — D2261 Melanocytic nevi of right upper limb, including shoulder: Secondary | ICD-10-CM | POA: Diagnosis not present

## 2019-04-20 DIAGNOSIS — L820 Inflamed seborrheic keratosis: Secondary | ICD-10-CM | POA: Diagnosis not present

## 2019-04-20 DIAGNOSIS — D225 Melanocytic nevi of trunk: Secondary | ICD-10-CM | POA: Diagnosis not present

## 2019-04-20 DIAGNOSIS — D2262 Melanocytic nevi of left upper limb, including shoulder: Secondary | ICD-10-CM | POA: Diagnosis not present

## 2019-05-08 ENCOUNTER — Encounter: Payer: Self-pay | Admitting: Family Medicine

## 2019-05-09 ENCOUNTER — Ambulatory Visit (INDEPENDENT_AMBULATORY_CARE_PROVIDER_SITE_OTHER): Payer: Medicare Other | Admitting: Physician Assistant

## 2019-05-09 ENCOUNTER — Encounter: Payer: Self-pay | Admitting: Physician Assistant

## 2019-05-09 ENCOUNTER — Other Ambulatory Visit: Payer: Self-pay

## 2019-05-09 VITALS — BP 138/80 | HR 79 | Temp 98.3°F | Ht 67.75 in | Wt 180.2 lb

## 2019-05-09 DIAGNOSIS — R2232 Localized swelling, mass and lump, left upper limb: Secondary | ICD-10-CM

## 2019-05-09 NOTE — Patient Instructions (Addendum)
It was great to see you!  Try to avoid touching area.  If it gets worse or changes in any way -- send me a MyChart message and I will refer you to a hand specialist.  Take care,  Inda Coke PA-C

## 2019-05-09 NOTE — Progress Notes (Signed)
Clinton Newton is a 68 y.o. male here for a new problem.   History of Present Illness:   Chief Complaint  Patient presents with  . sore finger    swelling for the past monthe on lt thumb    HPI  Patient reports that he has noticed a lump on the tip of his L thumb about 1 month ago. Getting larger and more tender with time. He is R handed. Does have hx of arthritis, but denies any rheumatological dx. Denies: fever, chills, discharge from area, inciting injury  Has tried tylenol and ibuprofen without relief.  Past Medical History:  Diagnosis Date  . Arthritis    L3-L4  . Cataract   . Colon polyps   . Diverticulitis   . GERD (gastroesophageal reflux disease)   . Hyperlipidemia   . Hypertension      Social History   Socioeconomic History  . Marital status: Married    Spouse name: Not on file  . Number of children: 0  . Years of education: Not on file  . Highest education level: Not on file  Occupational History  . Occupation: Retired  Scientific laboratory technician  . Financial resource strain: Not on file  . Food insecurity    Worry: Not on file    Inability: Not on file  . Transportation needs    Medical: Not on file    Non-medical: Not on file  Tobacco Use  . Smoking status: Never Smoker  . Smokeless tobacco: Never Used  Substance and Sexual Activity  . Alcohol use: Yes    Alcohol/week: 7.0 standard drinks    Types: 7 Glasses of wine per week  . Drug use: No  . Sexual activity: Yes  Lifestyle  . Physical activity    Days per week: Not on file    Minutes per session: Not on file  . Stress: Not on file  Relationships  . Social Herbalist on phone: Not on file    Gets together: Not on file    Attends religious service: Not on file    Active member of club or organization: Not on file    Attends meetings of clubs or organizations: Not on file    Relationship status: Not on file  . Intimate partner violence    Fear of current or ex partner: Not on file     Emotionally abused: Not on file    Physically abused: Not on file    Forced sexual activity: Not on file  Other Topics Concern  . 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 APPENDECTOMY    . COLONOSCOPY    . POLYPECTOMY    . PRE-MALIGNANT / BENIGN SKIN LESION EXCISION      Family History  Problem Relation Age of Onset  . Heart disease Father   . Colon cancer Neg Hx   . Pancreatic cancer Neg Hx   . Rectal cancer Neg Hx   . Stomach cancer Neg Hx   . Esophageal cancer Neg Hx     No Known Allergies  Current  Medications:   Current Outpatient Medications:  .  amLODipine (NORVASC) 10 MG tablet, Take 1 tablet (10 mg total) by mouth daily., Disp: 90 tablet, Rfl: 3 .  ascorbic acid (VITAMIN C) 1000 MG tablet, Take 1,000 mg by mouth daily., Disp: , Rfl:  .  aspirin 81 MG EC tablet, Take 81 mg by mouth daily.  , Disp: , Rfl:  .  bisoprolol (ZEBETA) 5 MG tablet, Take 0.5 tablets (2.5 mg total) by mouth daily., Disp: 45 tablet, Rfl: 3 .  cholecalciferol (VITAMIN D) 1000 UNITS tablet, Take 1,000 Units by mouth daily., Disp: , Rfl:  .  Flaxseed, Linseed, (FLAXSEED OIL PO), Take by mouth as directed., Disp: , Rfl:  .  methocarbamol (ROBAXIN) 500 MG tablet, Take 1 tablet (500 mg total) by mouth daily., Disp: 30 tablet, Rfl: 2 .  Multiple Vitamin (MULTIVITAMIN) tablet, Take 1 tablet by mouth daily.  , Disp: , Rfl:  .  Omega-3 Fatty Acids (FISH OIL PO), Take by mouth daily., Disp: , Rfl:  .  Probiotic Product (PROBIOTIC PO), Take by mouth daily., Disp: , Rfl:  .  rosuvastatin (CRESTOR) 20 MG tablet, TAKE 1 TABLET (20 MG TOTAL) BY MOUTH DAILY, Disp: 90 tablet, Rfl: 3 .  zolpidem (AMBIEN) 5 MG  tablet, Take 1 tablet (5 mg total) by mouth at bedtime as needed for sleep., Disp: 15 tablet, Rfl: 1 .  sertraline (ZOLOFT) 50 MG tablet, Take 1 tablet (50 mg total) by mouth daily. (Patient not taking: Reported on 05/09/2019), Disp: 90 tablet, Rfl: 3  Current Facility-Administered Medications:  .  0.9 %  sodium chloride infusion, 500 mL, Intravenous, Once, Ladene Artist, MD   Review of Systems:   ROS  Negative unless otherwise specified per HPI.   Vitals:   Vitals:   05/09/19 1412  BP: 138/80  Pulse: 79  Temp: 98.3 F (36.8 C)  TempSrc: Oral  SpO2: 95%  Weight: 180 lb 4 oz (81.8 kg)  Height: 5' 7.75" (1.721 m)     Body mass index is 27.61 kg/m.  Physical Exam:   Physical Exam Vitals signs and nursing note reviewed.  Constitutional:      Appearance: He is well-developed.  HENT:     Head: Normocephalic.  Eyes:     Conjunctiva/sclera: Conjunctivae normal.     Pupils: Pupils are equal, round, and reactive to light.  Neck:     Musculoskeletal: Normal range of motion.  Pulmonary:     Effort: Pulmonary effort is normal.  Musculoskeletal: Normal range of motion.     Comments: L thumb with small, soft but not fluctuant area at medial distal phalanx. Tender with deep palpation. No open areas or discharge present. Normal ROM.  Skin:    General: Skin is warm and dry.  Neurological:     Mental Status: He is alert and oriented to person, place, and time.     Comments: Normal sensation to R thumb tip.  Psychiatric:        Behavior: Behavior normal.        Thought Content: Thought content normal.        Judgment: Judgment normal.      Assessment and Plan:   Clinton Newton was seen today for sore finger.  Diagnoses and all orders for this visit:  Nodule of skin of left thumb   Dr. Juleen China also in to see patient. Appears benign. Possible lipoma? Will have patient monitor symptoms. If any worsening or change, send MyChart message, low threshold to  send to hand  surgeon.  . Reviewed expectations re: course of current medical issues. . Discussed self-management of symptoms. . Outlined signs and symptoms indicating need for more acute intervention. . Patient verbalized understanding and all questions were answered. . See orders for this visit as documented in the electronic medical record. . Patient received an After-Visit Summary.   Inda Coke, PA-C

## 2019-05-26 ENCOUNTER — Other Ambulatory Visit: Payer: Self-pay

## 2019-06-20 ENCOUNTER — Encounter: Payer: Self-pay | Admitting: Family Medicine

## 2019-06-23 ENCOUNTER — Ambulatory Visit (INDEPENDENT_AMBULATORY_CARE_PROVIDER_SITE_OTHER): Payer: Medicare Other | Admitting: Family Medicine

## 2019-06-23 ENCOUNTER — Encounter: Payer: Self-pay | Admitting: Family Medicine

## 2019-06-23 ENCOUNTER — Other Ambulatory Visit: Payer: Self-pay

## 2019-06-23 VITALS — BP 120/72 | HR 65 | Temp 98.0°F | Ht 67.75 in | Wt 181.6 lb

## 2019-06-23 DIAGNOSIS — E782 Mixed hyperlipidemia: Secondary | ICD-10-CM | POA: Diagnosis not present

## 2019-06-23 DIAGNOSIS — Z23 Encounter for immunization: Secondary | ICD-10-CM

## 2019-06-23 DIAGNOSIS — F418 Other specified anxiety disorders: Secondary | ICD-10-CM

## 2019-06-23 DIAGNOSIS — I10 Essential (primary) hypertension: Secondary | ICD-10-CM

## 2019-06-23 LAB — COMPREHENSIVE METABOLIC PANEL
ALT: 39 U/L (ref 0–53)
AST: 42 U/L — ABNORMAL HIGH (ref 0–37)
Albumin: 5 g/dL (ref 3.5–5.2)
Alkaline Phosphatase: 66 U/L (ref 39–117)
BUN: 14 mg/dL (ref 6–23)
CO2: 27 mEq/L (ref 19–32)
Calcium: 9.8 mg/dL (ref 8.4–10.5)
Chloride: 104 mEq/L (ref 96–112)
Creatinine, Ser: 0.65 mg/dL (ref 0.40–1.50)
GFR: 122.64 mL/min (ref >60.00)
Glucose, Bld: 123 mg/dL — ABNORMAL HIGH (ref 70–99)
Potassium: 3.8 mEq/L (ref 3.5–5.1)
Sodium: 140 mEq/L (ref 135–145)
Total Bilirubin: 0.6 mg/dL (ref 0.2–1.2)
Total Protein: 7.6 g/dL (ref 6.0–8.3)

## 2019-06-23 LAB — CBC WITH DIFFERENTIAL/PLATELET
Basophils Absolute: 0 10*3/uL (ref 0.0–0.1)
Basophils Relative: 0.6 % (ref 0.0–3.0)
Eosinophils Absolute: 0.3 10*3/uL (ref 0.0–0.7)
Eosinophils Relative: 6 % — ABNORMAL HIGH (ref 0.0–5.0)
HCT: 48.3 % (ref 39.0–52.0)
Hemoglobin: 16.7 g/dL (ref 13.0–17.0)
Lymphocytes Relative: 21.4 % (ref 12.0–46.0)
Lymphs Abs: 1 10*3/uL (ref 0.7–4.0)
MCHC: 34.5 g/dL (ref 30.0–36.0)
MCV: 95.5 fl (ref 78.0–100.0)
Monocytes Absolute: 0.7 10*3/uL (ref 0.1–1.0)
Monocytes Relative: 15.7 % — ABNORMAL HIGH (ref 3.0–12.0)
Neutro Abs: 2.6 10*3/uL (ref 1.4–7.7)
Neutrophils Relative %: 56.3 % (ref 43.0–77.0)
Platelets: 204 10*3/uL (ref 150.0–400.0)
RBC: 5.06 Mil/uL (ref 4.22–5.81)
RDW: 13.2 % (ref 11.5–15.5)
WBC: 4.7 10*3/uL (ref 4.0–10.5)

## 2019-06-23 MED ORDER — BISOPROLOL FUMARATE 5 MG PO TABS: 2.5000 mg | ORAL_TABLET | Freq: Every day | ORAL | 3 refills | Status: DC

## 2019-06-23 MED ORDER — BUSPIRONE HCL 7.5 MG PO TABS: 7.5000 mg | ORAL_TABLET | Freq: Three times a day (TID) | ORAL | 1 refills | Status: DC

## 2019-06-23 NOTE — Patient Instructions (Signed)
Great to see you Clinton Newton! You look great.  Refilled your requested medication. Doing routine blood pressure labs today including liver and you will come back fasting for your cholesterol, just make appointment. Orders are in chart.   Getting your high dose flu and your pneumonia shot today.   See you in 6 months!  Dr. Rogers Blocker

## 2019-06-23 NOTE — Progress Notes (Signed)
Patient: Clinton Newton MRN: [id number] DOB: 09-21-1951 PCP: Orma Flaming, MD     Subjective:  Chief Complaint  Patient presents with  . Medication Refill    HPI: The patient is a 68 y.o. male who presents today for med refills and anxiety regarding the covid. He had a dental appointment 9 days ago and he can't stop thinking about getting covid. He has no symptoms.  Before I left we put him on zoloft. He took this for a week and didn't "do for him what he wanted." he went back to the buspar and has figured out how to take this. He will take this 90 minutes before anxiety inducing event and this seems to work well for him. He is happy with this and would like a refill.   Hypertension: Here for follow up of hypertension.  Currently on norvasc and zebeta. Home readings range from [id number] diastolic. Takes medication as prescribed and denies any side effects. Exercise includes walking. Weight has been stable. Denies any chest pain, headaches, shortness of breath, vision changes, swelling in lower extremities.  Needs refill of his zebeta.   Review of Systems  Constitutional: Negative for chills, fatigue and fever.  HENT: Negative for congestion, postnasal drip, rhinorrhea and sore throat.   Eyes: Negative for visual disturbance.  Respiratory: Positive for cough. Negative for chest tightness and shortness of breath.        Slight cough-related to allergies?  Cardiovascular: Negative for chest pain and palpitations.  Gastrointestinal: Negative for abdominal pain, diarrhea, nausea and vomiting.  Musculoskeletal: Negative for back pain and neck pain.  Neurological: Negative for dizziness and headaches.  Psychiatric/Behavioral: Positive for sleep disturbance.    Allergies Patient has No Known Allergies.  Past Medical History Patient  has a past medical history of Arthritis, Cataract, Colon polyps, Diverticulitis, GERD (gastroesophageal reflux disease), Hyperlipidemia, and  Hypertension.  Surgical History Patient  has a past surgical history that includes Appendectomy; Colonoscopy; Polypectomy; and Pre-malignant / benign skin lesion excision.  Family History Pateint's family history includes Heart disease in his father.  Social History Patient  reports that he has never smoked. He has never used smokeless tobacco. He reports current alcohol use of about 7.0 standard drinks of alcohol per week. He reports that he does not use drugs.    Objective: Vitals:   06/23/19 1006  BP: 120/72  Pulse: 65  Temp: 98 F (36.7 C)  TempSrc: Skin  SpO2: 95%  Weight: 181 lb 9.6 oz (82.4 kg)  Height: 5' 7.75" (1.721 m)    Body mass index is 27.82 kg/m.  Physical Exam Vitals signs reviewed.  Constitutional:      Appearance: Normal appearance. He is normal weight.  HENT:     Head: Normocephalic and atraumatic.  Neck:     Musculoskeletal: Normal range of motion and neck supple.  Cardiovascular:     Rate and Rhythm: Normal rate and regular rhythm.     Heart sounds: Normal heart sounds. No murmur.     Comments: Mild edema in bilateral ankles. Left >right  Pulmonary:     Effort: Pulmonary effort is normal.     Breath sounds: Normal breath sounds.  Abdominal:     General: Abdomen is flat. Bowel sounds are normal.     Palpations: Abdomen is soft.  Neurological:     General: No focal deficit present.     Mental Status: He is alert and oriented to person, place, and time.  Psychiatric:  Mood and Affect: Mood normal.        Behavior: Behavior normal.    GAD 7 : Generalized Anxiety Score 06/23/2019 03/10/2019 01/05/2019  Nervous, Anxious, on Edge 1 3 1   Control/stop worrying 0 1 1  Worry too much - different things 0 1 1  Trouble relaxing 0 1 1  Restless 0 1 1  Easily annoyed or irritable 0 0 0  Afraid - awful might happen 0 2 1  Total GAD 7 Score 1 9 6   Anxiety Difficulty Not difficult at all Not difficult at all -    Depression screen [facility] 2/9  06/23/2019 09/28/2018 11/20/2016 10/29/2016  Decreased Interest 3 0 0 0  Down, Depressed, Hopeless 0 0 0 0  PHQ - 2 Score 3 0 0 0  Altered sleeping 0 - 0 -  Tired, decreased energy 0 - 0 -  Change in appetite 0 - 0 -  Feeling bad or failure about yourself  0 - 0 -  Trouble concentrating 1 - 0 -  Moving slowly or fidgety/restless 0 - 0 -  Suicidal thoughts 0 - 0 -  PHQ-9 Score 4 - 0 -  Difficult doing work/chores Not difficult at all - Not difficult at all -       Assessment/plan: 1. Situational anxiety GAD7 score well controlled. Likes the buspar prn and has figured out how to take it to control his anxiety which is situational. Refill given. I do think the covid causes him anxiety and he is quite worried about this. Declines daily anxiety medication.  - busPIRone (BUSPAR) 7.5 MG tablet; Take 1 tablet (7.5 mg total) by mouth 3 (three) times daily.  Dispense: 90 tablet; Refill: 1  2. Essential hypertension Blood pressure is to goal. Continue current anti-hypertensive medications. Refills given and routine lab work will be done today. Recommended routine exercise and healthy diet including DASH diet and mediterranean diet. Encouraged weight loss. F/u in 6 months.   - bisoprolol (ZEBETA) 5 MG tablet; Take 0.5 tablets (2.5 mg total) by mouth daily.  Dispense: 45 tablet; Refill: 3 - CBC with Differential/Platelet - Comprehensive metabolic panel  3. Hyperlipidemia -due for cholesterol. Will do future order for him to do when fasting.   Return in about 6 months (around 12/24/2019) for routine bp check up .  Orma Flaming, MD [place]   06/23/2019

## 2019-06-26 ENCOUNTER — Other Ambulatory Visit: Payer: Self-pay | Admitting: Family Medicine

## 2019-06-26 DIAGNOSIS — R7401 Elevation of levels of liver transaminase levels: Secondary | ICD-10-CM

## 2019-06-26 DIAGNOSIS — D72821 Monocytosis (symptomatic): Secondary | ICD-10-CM

## 2019-07-12 ENCOUNTER — Encounter: Payer: Self-pay | Admitting: Family Medicine

## 2019-07-17 ENCOUNTER — Other Ambulatory Visit: Payer: Self-pay

## 2019-07-17 ENCOUNTER — Other Ambulatory Visit (INDEPENDENT_AMBULATORY_CARE_PROVIDER_SITE_OTHER): Payer: Medicare Other

## 2019-07-17 DIAGNOSIS — R7401 Elevation of levels of liver transaminase levels: Secondary | ICD-10-CM

## 2019-07-17 DIAGNOSIS — E782 Mixed hyperlipidemia: Secondary | ICD-10-CM | POA: Diagnosis not present

## 2019-07-17 DIAGNOSIS — R740 Nonspecific elevation of levels of transaminase and lactic acid dehydrogenase [LDH]: Secondary | ICD-10-CM

## 2019-07-17 DIAGNOSIS — D72821 Monocytosis (symptomatic): Secondary | ICD-10-CM | POA: Diagnosis not present

## 2019-07-17 LAB — COMPREHENSIVE METABOLIC PANEL
ALT: 38 U/L (ref 0–53)
AST: 32 U/L (ref 0–37)
Albumin: 4.7 g/dL (ref 3.5–5.2)
Alkaline Phosphatase: 59 U/L (ref 39–117)
BUN: 13 mg/dL (ref 6–23)
CO2: 26 mEq/L (ref 19–32)
Calcium: 9.7 mg/dL (ref 8.4–10.5)
Chloride: 108 mEq/L (ref 96–112)
Creatinine, Ser: 0.61 mg/dL (ref 0.40–1.50)
GFR: 131.94 mL/min (ref >60.00)
Glucose, Bld: 102 mg/dL — ABNORMAL HIGH (ref 70–99)
Potassium: 3.8 mEq/L (ref 3.5–5.1)
Sodium: 144 mEq/L (ref 135–145)
Total Bilirubin: 0.7 mg/dL (ref 0.2–1.2)
Total Protein: 7.3 g/dL (ref 6.0–8.3)

## 2019-07-17 LAB — CBC WITH DIFFERENTIAL/PLATELET
Basophils Absolute: 0 10*3/uL (ref 0.0–0.1)
Basophils Relative: 0.6 % (ref 0.0–3.0)
Eosinophils Absolute: 0.3 10*3/uL (ref 0.0–0.7)
Eosinophils Relative: 7.7 % — ABNORMAL HIGH (ref 0.0–5.0)
HCT: 47.6 % (ref 39.0–52.0)
Hemoglobin: 16.6 g/dL (ref 13.0–17.0)
Lymphocytes Relative: 32.1 % (ref 12.0–46.0)
Lymphs Abs: 1.5 10*3/uL (ref 0.7–4.0)
MCHC: 34.9 g/dL (ref 30.0–36.0)
MCV: 94.2 fl (ref 78.0–100.0)
Monocytes Absolute: 0.6 10*3/uL (ref 0.1–1.0)
Monocytes Relative: 12.4 % — ABNORMAL HIGH (ref 3.0–12.0)
Neutro Abs: 2.2 10*3/uL (ref 1.4–7.7)
Neutrophils Relative %: 47.2 % (ref 43.0–77.0)
Platelets: 186 10*3/uL (ref 150.0–400.0)
RBC: 5.05 Mil/uL (ref 4.22–5.81)
RDW: 13.1 % (ref 11.5–15.5)
WBC: 4.6 10*3/uL (ref 4.0–10.5)

## 2019-07-17 LAB — LIPID PANEL
Cholesterol: 161 mg/dL (ref 0–200)
HDL: 54 mg/dL (ref >39.00)
LDL Cholesterol: 72 mg/dL (ref 0–99)
NonHDL: 107.38
Total CHOL/HDL Ratio: 3
Triglycerides: 178 mg/dL — ABNORMAL HIGH (ref 0.0–149.0)
VLDL: 35.6 mg/dL (ref 0.0–40.0)

## 2019-07-18 ENCOUNTER — Other Ambulatory Visit: Payer: Self-pay | Admitting: Family Medicine

## 2019-07-18 DIAGNOSIS — F418 Other specified anxiety disorders: Secondary | ICD-10-CM

## 2019-07-18 LAB — PATHOLOGIST SMEAR REVIEW

## 2019-09-07 ENCOUNTER — Encounter: Payer: Self-pay | Admitting: Family Medicine

## 2019-09-07 ENCOUNTER — Ambulatory Visit (INDEPENDENT_AMBULATORY_CARE_PROVIDER_SITE_OTHER): Payer: Medicare Other | Admitting: Family Medicine

## 2019-09-07 VITALS — Temp 97.1°F | Ht 67.75 in | Wt 181.6 lb

## 2019-09-07 DIAGNOSIS — M5136 Other intervertebral disc degeneration, lumbar region: Secondary | ICD-10-CM

## 2019-09-07 DIAGNOSIS — F5101 Primary insomnia: Secondary | ICD-10-CM | POA: Diagnosis not present

## 2019-09-07 DIAGNOSIS — F418 Other specified anxiety disorders: Secondary | ICD-10-CM

## 2019-09-07 MED ORDER — ZOLPIDEM TARTRATE 5 MG PO TABS: 5.0000 mg | ORAL_TABLET | Freq: Every evening | ORAL | 1 refills | Status: DC | PRN

## 2019-09-07 MED ORDER — METHOCARBAMOL 750 MG PO TABS: 750.0000 mg | ORAL_TABLET | Freq: Three times a day (TID) | ORAL | 0 refills | Status: DC

## 2019-09-07 NOTE — Progress Notes (Signed)
Patient: Clinton Newton MRN: [id number] DOB: [birth year] PCP: Orma Flaming, MD     I connected with Clinton Newton on 09/07/19 at 10:05am  by a video enabled telemedicine application and verified that I am speaking with the correct person using two identifiers.  Location patient: Home Location provider:  [facility], Office Persons participating in this virtual visit: Clinton Newton and Dr. Rogers Blocker   I discussed the limitations of evaluation and management by telemedicine and the availability of in person appointments. The patient expressed understanding and agreed to proceed.   Subjective:  Chief Complaint  Patient presents with  . Medication Refill    HPI: The patient is a 68 y.o. male who presents today for anxiety and medication refills. He still is on his buspar prn. He does not take daily and just uses prn. He feels like this works well for him and he has no complaints. He is needing a refill of his Lorrin Mais which he rarely takes (15 pills has lasted him 9 months). He is also needing a refill of his robaxin which I have never filled. He only takes this as needed. He will use robaxin for his back if doing certain activities that will trigger pain. He was tried on flexeril and this made him too sleepy. He tried to do 250mg  of the robaxin, but this wasn't enough. He is interested in getting a 750mg  pill and taking half of this as he wants to be on a smaller dose as needed.   Otherwise feeling great and doing well.   Review of Systems  Constitutional: Negative for fatigue.  Eyes: Negative for visual disturbance.  Respiratory: Negative for shortness of breath.   Cardiovascular: Negative for chest pain.  Gastrointestinal: Negative for abdominal pain, diarrhea, nausea and vomiting.  Neurological: Negative for dizziness and headaches.  Psychiatric/Behavioral: Negative for sleep disturbance.    Allergies Patient has No Known Allergies.  Past Medical History Patient  has a past medical  history of Arthritis, Cataract, Colon polyps, Diverticulitis, GERD (gastroesophageal reflux disease), Hyperlipidemia, and Hypertension.  Surgical History Patient  has a past surgical history that includes Appendectomy; Colonoscopy; Polypectomy; and Pre-malignant / benign skin lesion excision.  Family History Pateint's family history includes Heart disease in his father.  Social History Patient  reports that he has never smoked. He has never used smokeless tobacco. He reports current alcohol use of about 7.0 standard drinks of alcohol per week. He reports that he does not use drugs.    Objective: Vitals:   09/07/19 0956  Temp: (!) 97.1 F (36.2 C)  TempSrc: Oral  Weight: 181 lb 9.6 oz (82.4 kg)  Height: 5' 7.75" (1.721 m)    Body mass index is 27.82 kg/m.  Physical Exam Vitals signs reviewed.  Constitutional:      Appearance: Normal appearance.  HENT:     Head: Normocephalic and atraumatic.  Pulmonary:     Effort: Pulmonary effort is normal.  Neurological:     General: No focal deficit present.     Mental Status: He is alert and oriented to person, place, and time.      GAD 7 : Generalized Anxiety Score 09/07/2019 06/23/2019 03/10/2019 01/05/2019  Nervous, Anxious, on Edge 1 1 3 1   Control/stop worrying 1 0 1 1  Worry too much - different things 1 0 1 1  Trouble relaxing 1 0 1 1  Restless 1 0 1 1  Easily annoyed or irritable 0 0 0 0  Afraid - awful might happen 1 0  2 1  Total GAD 7 Score 6 1 9 6   Anxiety Difficulty Not difficult at all Not difficult at all Not difficult at all -        Assessment/plan: 1. Lumbar degenerative disc disease Robaxin prn. Does well for him prn. Will do trial of 750mg  dose that he can half for smaller than 500mg  dosage. Continue with his exercise and this prn. Pain well controlled.   2. Situational anxiety gad7 score slightly increased, but doing quite well on his buspar. He has no complaints and feels like his anxiety is under good  control no changes to current plan.   3. Primary insomnia pmp reviewed. Last refilled 11/2018 for 15 pills. Rarely uses and only prn basis. 15 pills filled today.     Return for keep appointment in february for routine f/u with labs. .  Records requested if needed. Time spent with patient: 15 minutes, of which >50% was spent in obtaining information about his symptoms, reviweing his previous labs, evaluations, and treatments, counseling him about his conditions (please see discussed topics above), and developing a plan to further investigate it; he had a number of questions which I addressed.    Orma Flaming, MD [place]  09/07/2019

## 2019-09-08 ENCOUNTER — Ambulatory Visit (INDEPENDENT_AMBULATORY_CARE_PROVIDER_SITE_OTHER): Payer: Medicare Other

## 2019-09-08 ENCOUNTER — Other Ambulatory Visit: Payer: Self-pay

## 2019-09-11 NOTE — Progress Notes (Signed)
Erroneous encounter

## 2019-09-17 ENCOUNTER — Encounter: Payer: Self-pay | Admitting: Family Medicine

## 2019-09-25 NOTE — Telephone Encounter (Signed)
See note  Copied from [place] [id number]. Topic: Appointment Scheduling - Scheduling Inquiry for Clinic >> Sep 25, 2019  8:52 AM Carole Binning B wrote: LVM to contact office to schedule appt for mychart message about vibration in chest area (PCP approved scheduling) >> Sep 25, 2019  4:24 PM Leward Quan A wrote: Patient called to schedule the appointment as requested by Dr Rogers Blocker the first available was on 11/13/2019. Pateint is asking if Dr Donzetta Matters nurse can possibly help him to get an appointment for an earlier date say his schedule is very flexible. Please call him at Ph#  [phone]

## 2019-09-26 ENCOUNTER — Other Ambulatory Visit: Payer: Self-pay

## 2019-09-26 NOTE — Telephone Encounter (Signed)
LVM FOR PATIENT TO CALL BACK AND SCHEDULE

## 2019-09-29 ENCOUNTER — Ambulatory Visit (INDEPENDENT_AMBULATORY_CARE_PROVIDER_SITE_OTHER): Payer: Medicare Other | Admitting: Family Medicine

## 2019-09-29 ENCOUNTER — Other Ambulatory Visit: Payer: Self-pay

## 2019-09-29 ENCOUNTER — Encounter: Payer: Self-pay | Admitting: Family Medicine

## 2019-09-29 VITALS — BP 122/72 | HR 68 | Temp 97.8°F | Ht 67.75 in | Wt 186.0 lb

## 2019-09-29 DIAGNOSIS — R002 Palpitations: Secondary | ICD-10-CM

## 2019-09-29 DIAGNOSIS — I10 Essential (primary) hypertension: Secondary | ICD-10-CM | POA: Diagnosis not present

## 2019-09-29 LAB — TSH: TSH: 2.41 u[IU]/mL (ref 0.35–4.50)

## 2019-09-29 NOTE — Patient Instructions (Signed)
1) checking thyroid lab value  2) ekg and ordering echo on you to check structure and function of heart.  3) if continues would be worth getting another heart monitor and referral to cards.   Keep me posted.... MERRY CHRISTMAS! Aw

## 2019-09-29 NOTE — Progress Notes (Signed)
Patient: Clinton Newton MRN: [id number] DOB: [birth year] PCP: Orma Flaming, MD     Subjective:  Chief Complaint  Patient presents with  . fluttering in chest    HPI: The patient is a 68 y.o. male who presents today for fluttering in chest. Has had long term monitor done in 12/2018 that was overall normal. He had 5 beats of SVT and his triggered event markers did not correlate with any arrhythmia. Did not get echo done. He has not felt it in a day or two. He states he has been feeling a vibration type sensation deep inside on the left anterior chest wall, but deep. He describes it like a cell phone on vibrate. He had the feeling constantly  Over the past few weeks. Feeling did not change with exacerbation/walking up hills. He walks about 5 miles/day intermittently throughout day. Nothing made it better or worse. He has no other symptoms. Denies shortness of breath, cough, chest pain, swelling of legs or any kind of pain down left arm/up jaw. No new over the counter medication. Heart rate has been running 60-70s resting. Blood pressure has also been well controlled as it usually is. Denies any heavy lifting or anterior chest wall work out prior. Not reproducible.   Review of Systems  Constitutional: Negative for fatigue.  Eyes: Negative for visual disturbance.  Respiratory: Negative for shortness of breath.   Cardiovascular: Positive for palpitations. Negative for chest pain and leg swelling.       C/o fluttering feeling in chest x several weeks  Gastrointestinal: Negative for abdominal pain and nausea.  Musculoskeletal: Negative for neck pain and neck stiffness.  Neurological: Negative for dizziness and headaches.  Psychiatric/Behavioral: Negative for sleep disturbance.    Allergies Patient has No Known Allergies.  Past Medical History Patient  has a past medical history of Arthritis, Cataract, Colon polyps, Diverticulitis, GERD (gastroesophageal reflux disease), Hyperlipidemia, and  Hypertension.  Surgical History Patient  has a past surgical history that includes Appendectomy; Colonoscopy; Polypectomy; and Pre-malignant / benign skin lesion excision.  Family History Pateint's family history includes Heart disease in his father.  Social History Patient  reports that he has never smoked. He has never used smokeless tobacco. He reports current alcohol use of about 7.0 standard drinks of alcohol per week. He reports that he does not use drugs.    Objective: Vitals:   09/29/19 1311  BP: 122/72  Pulse: 68  Temp: 97.8 F (36.6 C)  TempSrc: Skin  SpO2: 95%  Weight: 186 lb (84.4 kg)  Height: 5' 7.75" (1.721 m)    Body mass index is 28.49 kg/m.  Physical Exam Vitals signs reviewed.  Constitutional:      Appearance: Normal appearance.  HENT:     Head: Normocephalic and atraumatic.  Neck:     Musculoskeletal: Normal range of motion and neck supple.  Cardiovascular:     Rate and Rhythm: Normal rate and regular rhythm.     Pulses: Normal pulses.     Heart sounds: Normal heart sounds. No murmur.     Comments: No reproducible tenderness  Pulmonary:     Effort: Pulmonary effort is normal.     Breath sounds: Normal breath sounds.  Abdominal:     General: Abdomen is flat. Bowel sounds are normal.     Palpations: Abdomen is soft.  Skin:    General: Skin is warm and dry.     Capillary Refill: Capillary refill takes less than 2 seconds.  Neurological:  General: No focal deficit present.     Mental Status: He is alert and oriented to person, place, and time.  Psychiatric:        Mood and Affect: Mood normal.        Behavior: Behavior normal.    Ekg: nsr with rate of 73    Assessment/plan: 1. Heart palpitations ekg wnl and will check TSH. Last one was in 2018. Did not get echo done due to covid, so we will have this done. Did have zio patch done in 12/2018 that was essentially normal. I wonder if more muscle related since more on left anterior chest wall.  If symptoms continue could do another zio for longer and then referral to cards. He is on board with this and will let me know.  - TSH - EKG 12-Lead   2. Essential hypertension  - ECHOCARDIOGRAM COMPLETE; Future  This visit occurred during the SARS-CoV-2 public health emergency.  Safety protocols were in place, including screening questions prior to the visit, additional usage of staff PPE, and extensive cleaning of exam room while observing appropriate contact time as indicated for disinfecting solutions.     Return in about 3 months (around 12/28/2019), or if symptoms worsen or fail to improve, for regular htn follow up with labs. Orma Flaming, MD [place]   09/29/2019

## 2019-10-02 ENCOUNTER — Other Ambulatory Visit (HOSPITAL_COMMUNITY): Payer: Medicare Other

## 2019-10-09 ENCOUNTER — Other Ambulatory Visit: Payer: Self-pay

## 2019-10-09 ENCOUNTER — Ambulatory Visit (HOSPITAL_COMMUNITY): Payer: Medicare Other | Attending: Cardiovascular Disease

## 2019-10-09 DIAGNOSIS — I10 Essential (primary) hypertension: Secondary | ICD-10-CM

## 2019-10-11 ENCOUNTER — Other Ambulatory Visit: Payer: Self-pay | Admitting: Family Medicine

## 2019-10-11 DIAGNOSIS — R600 Localized edema: Secondary | ICD-10-CM

## 2019-10-12 ENCOUNTER — Encounter: Payer: Self-pay | Admitting: Family Medicine

## 2019-10-12 DIAGNOSIS — I77810 Thoracic aortic ectasia: Secondary | ICD-10-CM | POA: Insufficient documentation

## 2019-11-09 ENCOUNTER — Encounter: Payer: Self-pay | Admitting: Family Medicine

## 2019-11-09 NOTE — Telephone Encounter (Signed)
Spoke to pt told him calling in regards to My Chart message and the vaccine. Pt said he sent by mistake and has contacted the Health Dept. Told him that is fine [facility] is offering vaccine also I can give you there number you might be able to get sooner. Pt said yes. Phone number given [phone]. Pt verbalized understanding.

## 2019-11-17 ENCOUNTER — Encounter: Payer: Self-pay | Admitting: Family Medicine

## 2019-11-26 ENCOUNTER — Ambulatory Visit: Payer: Medicare Other

## 2019-12-03 ENCOUNTER — Ambulatory Visit: Payer: Medicare Other

## 2019-12-16 ENCOUNTER — Other Ambulatory Visit: Payer: Self-pay | Admitting: Family Medicine

## 2019-12-22 ENCOUNTER — Encounter: Payer: Self-pay | Admitting: Family Medicine

## 2019-12-25 ENCOUNTER — Other Ambulatory Visit: Payer: Self-pay

## 2019-12-25 ENCOUNTER — Ambulatory Visit (INDEPENDENT_AMBULATORY_CARE_PROVIDER_SITE_OTHER): Payer: Medicare Other | Admitting: Family Medicine

## 2019-12-25 ENCOUNTER — Encounter: Payer: Self-pay | Admitting: Family Medicine

## 2019-12-25 VITALS — BP 122/70 | HR 65 | Temp 97.8°F | Ht 67.75 in | Wt 186.6 lb

## 2019-12-25 DIAGNOSIS — I10 Essential (primary) hypertension: Secondary | ICD-10-CM | POA: Diagnosis not present

## 2019-12-25 LAB — COMPREHENSIVE METABOLIC PANEL
ALT: 39 U/L (ref 0–53)
AST: 43 U/L — ABNORMAL HIGH (ref 0–37)
Albumin: 4.5 g/dL (ref 3.5–5.2)
Alkaline Phosphatase: 60 U/L (ref 39–117)
BUN: 15 mg/dL (ref 6–23)
CO2: 30 mEq/L (ref 19–32)
Calcium: 10.1 mg/dL (ref 8.4–10.5)
Chloride: 106 mEq/L (ref 96–112)
Creatinine, Ser: 0.8 mg/dL (ref 0.40–1.50)
GFR: 96.36 mL/min (ref >60.00)
Glucose, Bld: 110 mg/dL — ABNORMAL HIGH (ref 70–99)
Potassium: 4.5 mEq/L (ref 3.5–5.1)
Sodium: 144 mEq/L (ref 135–145)
Total Bilirubin: 0.7 mg/dL (ref 0.2–1.2)
Total Protein: 7.4 g/dL (ref 6.0–8.3)

## 2019-12-25 LAB — CBC WITH DIFFERENTIAL/PLATELET
Basophils Absolute: 0 10*3/uL (ref 0.0–0.1)
Basophils Relative: 0.3 % (ref 0.0–3.0)
Eosinophils Absolute: 0.2 10*3/uL (ref 0.0–0.7)
Eosinophils Relative: 4.4 % (ref 0.0–5.0)
HCT: 47.8 % (ref 39.0–52.0)
Hemoglobin: 16.4 g/dL (ref 13.0–17.0)
Lymphocytes Relative: 23.2 % (ref 12.0–46.0)
Lymphs Abs: 1.3 10*3/uL (ref 0.7–4.0)
MCHC: 34.3 g/dL (ref 30.0–36.0)
MCV: 96.7 fl (ref 78.0–100.0)
Monocytes Absolute: 0.9 10*3/uL (ref 0.1–1.0)
Monocytes Relative: 15.8 % — ABNORMAL HIGH (ref 3.0–12.0)
Neutro Abs: 3.1 10*3/uL (ref 1.4–7.7)
Neutrophils Relative %: 56.3 % (ref 43.0–77.0)
Platelets: 203 10*3/uL (ref 150.0–400.0)
RBC: 4.94 Mil/uL (ref 4.22–5.81)
RDW: 13.1 % (ref 11.5–15.5)
WBC: 5.6 10*3/uL (ref 4.0–10.5)

## 2019-12-25 NOTE — Patient Instructions (Signed)
You are doing well and look great! Blood pressure is great.  Come back fasting for next appointment in 6 months.   Always good to see you!  Dr. Rogers Blocker

## 2019-12-25 NOTE — Progress Notes (Signed)
Patient: Clinton Newton MRN: [id number] DOB: 09/24/1951 PCP: Orma Flaming, MD     Subjective:  Chief Complaint  Patient presents with  . Hypertension    mnth f/u    HPI: The patient is a 69 y.o. male who presents today for Hypertension follow up.  Hypertension: Here for follow up of hypertension.  Currently on norvasc 10mg  and bystolic 5mg . Home readings range from [id number] diastolic. Takes medication as prescribed and denies any side effects. Exercise includes walking daily. Weight has been stable. Denies any chest pain, headaches, shortness of breath, vision changes, swelling in lower extremities.   Has had both of his covid vaccines.      Review of Systems  Constitutional: Negative for chills, fatigue and fever.  HENT: Negative for sinus pain, sneezing and sore throat.   Respiratory: Negative for cough, shortness of breath and wheezing.   Cardiovascular: Negative for chest pain, palpitations and leg swelling.  Gastrointestinal: Negative for abdominal pain, nausea and vomiting.  Neurological: Negative for dizziness, light-headedness and headaches.    Allergies Patient has No Known Allergies.  Past Medical History Patient  has a past medical history of Arthritis, Cataract, Colon polyps, Diverticulitis, GERD (gastroesophageal reflux disease), Hyperlipidemia, and Hypertension.  Surgical History Patient  has a past surgical history that includes Appendectomy; Colonoscopy; Polypectomy; and Pre-malignant / benign skin lesion excision.  Family History Pateint's family history includes Heart disease in his father.  Social History Patient  reports that he has never smoked. He has never used smokeless tobacco. He reports current alcohol use of about 7.0 standard drinks of alcohol per week. He reports that he does not use drugs.    Objective: Vitals:   12/25/19 1041  BP: 122/70  Pulse: 65  Temp: 97.8 F (36.6 C)  TempSrc: Temporal  SpO2: 95%  Weight: 186 lb  9.6 oz (84.6 kg)  Height: 5' 7.75" (1.721 m)    Body mass index is 28.58 kg/m.  Physical Exam Vitals reviewed.  Constitutional:      Appearance: He is well-developed.  HENT:     Right Ear: External ear normal.     Left Ear: External ear normal.  Eyes:     Conjunctiva/sclera: Conjunctivae normal.     Pupils: Pupils are equal, round, and reactive to light.  Neck:     Thyroid: No thyromegaly.  Cardiovascular:     Rate and Rhythm: Normal rate and regular rhythm.     Heart sounds: Normal heart sounds. No murmur.  Pulmonary:     Effort: Pulmonary effort is normal.     Breath sounds: Normal breath sounds.  Abdominal:     General: Bowel sounds are normal. There is no distension.     Palpations: Abdomen is soft.     Tenderness: There is no abdominal tenderness.  Musculoskeletal:     Cervical back: Normal range of motion and neck supple.  Lymphadenopathy:     Cervical: No cervical adenopathy.  Skin:    General: Skin is warm and dry.     Findings: No rash.  Neurological:     Mental Status: He is alert and oriented to person, place, and time.     Cranial Nerves: No cranial nerve deficit.     Coordination: Coordination normal.     Deep Tendon Reflexes: Reflexes normal.  Psychiatric:        Behavior: Behavior normal.         Assessment/plan: 1. Essential hypertension Blood pressure is to goal. Continue current anti-hypertensive medications. Refills  not given and routine lab work will be done today. Recommended routine exercise and healthy diet including DASH diet and mediterranean diet. Encouraged weight loss. F/u in 6 months with fasting labs.   - CBC with Differential/Platelet - Comprehensive metabolic panel    This visit occurred during the SARS-CoV-2 public health emergency.  Safety protocols were in place, including screening questions prior to the visit, additional usage of staff PPE, and extensive cleaning of exam room while observing appropriate contact time as  indicated for disinfecting solutions.     Return in about 6 months (around 06/26/2020) for fasting lab: chol/blood pressure .   Orma Flaming, MD [place]   12/25/2019

## 2019-12-26 NOTE — Telephone Encounter (Signed)
Please Advise

## 2019-12-27 ENCOUNTER — Other Ambulatory Visit: Payer: Self-pay | Admitting: Family Medicine

## 2019-12-27 DIAGNOSIS — R7989 Other specified abnormal findings of blood chemistry: Secondary | ICD-10-CM

## 2020-01-03 DIAGNOSIS — H25043 Posterior subcapsular polar age-related cataract, bilateral: Secondary | ICD-10-CM | POA: Diagnosis not present

## 2020-01-03 DIAGNOSIS — D3132 Benign neoplasm of left choroid: Secondary | ICD-10-CM | POA: Diagnosis not present

## 2020-01-03 DIAGNOSIS — H52203 Unspecified astigmatism, bilateral: Secondary | ICD-10-CM | POA: Diagnosis not present

## 2020-01-08 ENCOUNTER — Other Ambulatory Visit: Payer: Self-pay | Admitting: Family Medicine

## 2020-01-08 DIAGNOSIS — F418 Other specified anxiety disorders: Secondary | ICD-10-CM

## 2020-02-14 ENCOUNTER — Telehealth: Payer: Self-pay | Admitting: Family Medicine

## 2020-02-14 NOTE — Telephone Encounter (Signed)
Left message for patient to call back and schedule Medicare Annual Wellness Visit (AWV) either virtually/audio only OR in office. Whatever the patients preference is.  Welcome to [facility] exam was 04/14/2018; please schedule at anytime with [facility]-Nurse Health Advisor at [facility].

## 2020-02-22 ENCOUNTER — Ambulatory Visit (INDEPENDENT_AMBULATORY_CARE_PROVIDER_SITE_OTHER): Payer: Medicare Other

## 2020-02-22 ENCOUNTER — Other Ambulatory Visit: Payer: Self-pay

## 2020-02-22 DIAGNOSIS — Z0000 Encounter for general adult medical examination without abnormal findings: Secondary | ICD-10-CM | POA: Diagnosis not present

## 2020-02-22 NOTE — Progress Notes (Signed)
Subjective:   Clinton Newton is a 69 y.o. male who presents for an Initial Medicare Annual Wellness Visit.  Review of Systems   Cardiac Risk Factors include: advanced age (>47men, >61 women);hypertension;male gender;dyslipidemia   Objective:    There were no vitals filed for this visit. There is no height or weight on file to calculate BMI.  Advanced Directives 02/22/2020 11/09/2017 02/05/2017 12/22/2016 12/01/2016 11/20/2016  Does Patient Have a Medical Advance Directive? Yes Yes Yes No Yes Yes  Type of Advance Directive Living will;Healthcare Power of [place];Living will - [place];Living will [place];Living will  Does patient want to make changes to medical advance directive? No - Patient declined - - - No - Patient declined -  Copy of [place] in Chart? No - copy requested - No - copy requested - No - copy requested No - copy requested    Current Medications (verified) Outpatient Encounter Medications as of 02/22/2020  Medication Sig  . amLODipine (NORVASC) 10 MG tablet Take 1 tablet (10 mg total) by mouth daily.  Marland Kitchen ascorbic acid (VITAMIN C) 1000 MG tablet Take 1,000 mg by mouth daily.  Marland Kitchen aspirin 81 MG EC tablet Take 81 mg by mouth daily.    . bisoprolol (ZEBETA) 5 MG tablet Take 0.5 tablets (2.5 mg total) by mouth daily.  . busPIRone (BUSPAR) 7.5 MG tablet TAKE 1 TABLET (7.5 MG TOTAL) BY MOUTH 3 (THREE) TIMES DAILY.  . cholecalciferol (VITAMIN D) 1000 UNITS tablet Take 1,000 Units by mouth daily.  . Flaxseed, Linseed, (FLAXSEED OIL PO) Take by mouth as directed.  . methocarbamol (ROBAXIN-750) 750 MG tablet Take 1 tablet (750 mg total) by mouth 3 (three) times daily.  . Multiple Vitamin (MULTIVITAMIN) tablet Take 1 tablet by mouth daily.    . Omega-3 Fatty Acids (FISH OIL PO) Take by mouth daily.  . Probiotic Product (PROBIOTIC PO) Take by mouth daily.  .  rosuvastatin (CRESTOR) 20 MG tablet TAKE 1 TABLET BY MOUTH EVERY DAY  . vitamin B-12 (CYANOCOBALAMIN) 1000 MCG tablet Take 1,000 mcg by mouth daily.  Marland Kitchen zolpidem (AMBIEN) 5 MG tablet Take 1 tablet (5 mg total) by mouth at bedtime as needed for sleep.   Facility-Administered Encounter Medications as of 02/22/2020  Medication  . 0.9 %  sodium chloride infusion    Allergies (verified) Patient has no known allergies.   History: Past Medical History:  Diagnosis Date  . Arthritis    L3-L4  . Cataract   . Colon polyps   . Diverticulitis   . GERD (gastroesophageal reflux disease)   . Hyperlipidemia   . Hypertension    Past Surgical History:  Procedure Laterality Date  . APPENDECTOMY    . COLONOSCOPY    . POLYPECTOMY    . PRE-MALIGNANT / BENIGN SKIN LESION EXCISION     Family History  Problem Relation Age of Onset  . Heart disease Father   . Colon cancer Neg Hx   . Pancreatic cancer Neg Hx   . Rectal cancer Neg Hx   . Stomach cancer Neg Hx   . Esophageal cancer Neg Hx    Social History   Socioeconomic History  . Marital status: Married    Spouse name: Not on file  . Number of children: 0  . Years of education: Not on file  . Highest education level: Not on file  Occupational History  . Occupation: Retired  Tobacco Use  . Smoking status: Never Smoker  . Smokeless tobacco: Never Used  Substance and Sexual Activity  . Alcohol use: Yes    Alcohol/week: 7.0 standard drinks    Types: 7 Glasses of wine per week  . Drug use: No  . Sexual activity: Yes  Other Topics Concern  . 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 Difficulty of Paying Living Expenses:   Food Insecurity:   . Worried  About Charity fundraiser in the Last Year:   . Arboriculturist in the Last Year:   Transportation Needs:   . Film/video editor (Medical):   Marland Kitchen Lack of Transportation (Non-Medical):   Physical Activity:   . Days of Exercise per Week:   . Minutes of Exercise per Session:   Stress:   . Feeling of Stress :   Social Connections:   . Frequency of Communication with Friends and Family:   . Frequency of Social Gatherings with Friends and Family:   . Attends Religious Services:   . Active Member of Clubs or Organizations:   . Attends Archivist Meetings:   Marland Kitchen Marital Status:    Tobacco Counseling Counseling given: Not Answered   Clinical Intake:  Pre-visit preparation completed: Yes  Pain : No/denies pain  Diabetes: No  How often do you need to have someone help you when you read instructions, pamphlets, or other written materials from your doctor or pharmacy?: 1 - Never  Interpreter Needed?: No  Information entered by :: Denman George LPN  Activities of Daily Living In your present state of health, do you have any difficulty performing the following activities: 02/22/2020  Hearing? N  Vision? N  Difficulty concentrating or making decisions? N  Walking or climbing stairs? N  Dressing or bathing? N  Doing errands, shopping? N  Preparing Food and eating ? N  Using the Toilet? N  In the past six months, have you accidently leaked urine? N  Do you have problems with loss of bowel control? N  Managing your Medications? N  Managing your Finances? N  Housekeeping or managing your Housekeeping? N  Some recent data might be hidden     Immunizations and Health Maintenance Immunization History  Administered Date(s) Administered  . Fluad Quad(high Dose 65+) 06/23/2019  . Influenza Split 09/19/2012  . Influenza Whole 08/30/2007, 07/11/2010  . Influenza, High Dose Seasonal PF 06/23/2018  . Influenza,inj,Quad PF,6+ Mos 07/18/2013, 06/28/2015, 07/07/2017  .  Influenza-Unspecified 08/25/2014, 07/07/2016, 06/26/2017  . PFIZER SARS-COV-2 Vaccination 11/30/2019, 12/21/2019  . Pneumococcal Conjugate-13 01/11/2018  . Pneumococcal Polysaccharide-23 06/23/2019  . Td 04/28/2007, 09/09/2017  . Zoster 03/22/2013   There are no preventive care reminders to display for this patient.  Patient Care Team: Orma Flaming, MD as PCP - General (Family Medicine) Irine Seal, MD as Attending Physician (Urology) Gaynelle Arabian, MD as Consulting Physician (Orthopedic Surgery) Crista Luria, MD as Consulting Physician (Dermatology) Ladene Artist, MD as Consulting Physician (Gastroenterology) Luberta Mutter, MD as Consulting Physician (Ophthalmology)  Indicate any recent Medical Services you may have received from other than [facility] providers in  the past year (date may be approximate).    Assessment:   This is a routine wellness examination for Marvyn.  Hearing/Vision screen No exam data present  Dietary issues and exercise activities discussed: Current Exercise Habits: Home exercise routine, Type of exercise: walking, Time (Minutes): 30, Frequency (Times/Week): 5, Weekly Exercise (Minutes/Week): 150, Intensity: Mild  Goals   None    Depression Screen PHQ 2/9 Scores 02/22/2020 12/25/2019 06/23/2019 09/28/2018  PHQ - 2 Score 0 0 3 0  PHQ- 9 Score - 0 4 -  Exception Documentation - - - -    Fall Risk Fall Risk  02/22/2020 12/25/2019 05/26/2019 01/20/2018 12/13/2017  Falls in the past year? 0 0 (No Data) No No  Comment - - Emmi Telephone Survey: data to providers prior to load - -  Number falls in past yr: 0 - (No Data) - -  Comment - - Emmi Telephone Survey Actual Response =  - -  Injury with Fall? 0 - - - -  Risk for fall due to : - - - - -  Follow up Falls evaluation completed;Education provided;Falls prevention discussed - - - -    Is the patient's home free of loose throw rugs in walkways, pet beds, electrical cords, etc?   yes      Grab bars in the  bathroom? yes      Handrails on the stairs?   yes      Adequate lighting?   yes  Cognitive Function: no cognitive concerns at this time      6CIT Screen 02/22/2020  What Year? 0 points  What month? 0 points  What time? 0 points  Count back from 20 0 points  Months in reverse 0 points  Repeat phrase 0 points  Total Score 0    Screening Tests Health Maintenance  Topic Date Due  . INFLUENZA VACCINE  05/26/2020  . COLONOSCOPY  11/09/2022  . TETANUS/TDAP  09/10/2027  . COVID-19 Vaccine  Completed  . Hepatitis C Screening  Completed  . PNA vac Low Risk Adult  Completed    Qualifies for Shingles Vaccine? Discussed and patient will check with pharmacy for coverage.  Patient education handout provided   Cancer Screenings: Lung: Low Dose CT Chest recommended if Age 104-80 years, 30 pack-year currently smoking OR have quit w/in 15years. Patient does not qualify. Colorectal: colonoscopy 11/09/17     Plan:  I have personally reviewed and addressed the Medicare Annual Wellness questionnaire and have noted the following in the patient's chart:  A. Medical and social history B. Use of alcohol, tobacco or illicit drugs  C. Current medications and supplements D. Functional ability and status E.  Nutritional status F.  Physical activity G. Advance directives H. List of other physicians I.  Hospitalizations, surgeries, and ER visits in previous 12 months J.  Clinton such as hearing and vision if needed, cognitive and depression L. Referrals, records requested, and appointments- none   In addition, I have reviewed and discussed with patient certain preventive protocols, quality metrics, and best practice recommendations. A written personalized care plan for preventive services as well as general preventive health recommendations were provided to patient.   Signed,  Denman George, LPN  Nurse Health Advisor   Nurse Notes: no additional

## 2020-02-22 NOTE — Progress Notes (Signed)
This visit is being conducted via phone call due to the COVID-19 pandemic. This patient has given me verbal consent via phone to conduct this visit, patient states they are participating from their home address. Some vital signs may be absent or patient reported.   Patient identification: identified by name, DOB, and current address.  Location provider: [facility], Office Persons participating in the virtual visit:  Denman George LPN, patient, and Dr. Orma Flaming

## 2020-02-22 NOTE — Patient Instructions (Signed)
Clinton Newton , Thank you for taking time to come for your Medicare Wellness Visit. I appreciate your ongoing commitment to your health goals. Please review the following plan we discussed and let me know if I can assist you in the future.   Screening recommendations/referrals: Colorectal Screening: up to date; last colonoscopy 11/09/17  Vision and Dental Exams: Recommended annual ophthalmology exams for early detection of glaucoma and other disorders of the eye Recommended annual dental exams for proper oral hygiene  Vaccinations: Influenza vaccine: completed 06/23/19 Pneumococcal vaccine: up to date; last 06/23/19 Tdap vaccine: up to date; last 09/09/17 Shingles vaccine: You may receive this vaccine at your local pharmacy. (see handout)  Covid vaccine: Completed   Advanced directives: Please bring a copy of your POA (Power of Attorney) and/or Living Will to your next appointment.  Goals: Recommend to drink at least 6-8 8oz glasses of water per day and consume a balanced diet rich in fresh fruits and vegetables.   Next appointment: Please schedule your Annual Wellness Visit with your Nurse Health Advisor in one year.  Preventive Care 44 Years and Older, Male Preventive care refers to lifestyle choices and visits with your health care provider that can promote health and wellness. What does preventive care include?  A yearly physical exam. This is also called an annual well check.  Dental exams once or twice a year.  Routine eye exams. Ask your health care provider how often you should have your eyes checked.  Personal lifestyle choices, including:  Daily care of your teeth and gums.  Regular physical activity.  Eating a healthy diet.  Avoiding tobacco and drug use.  Limiting alcohol use.  Practicing safe sex.  Taking low doses of aspirin every day if recommended by your health care provider..  Taking vitamin and mineral supplements as recommended by your health care  provider. What happens during an annual well check? The services and screenings done by your health care provider during your annual well check will depend on your age, overall health, lifestyle risk factors, and family history of disease. Counseling  Your health care provider may ask you questions about your:  Alcohol use.  Tobacco use.  Drug use.  Emotional well-being.  Home and relationship well-being.  Sexual activity.  Eating habits.  History of falls.  Memory and ability to understand (cognition).  Work and work Statistician. Screening  You may have the following tests or measurements:  Height, weight, and BMI.  Blood pressure.  Lipid and cholesterol levels. These may be checked every 5 years, or more frequently if you are over 75 years old.  Skin check.  Lung cancer screening. You may have this screening every year starting at age 65 if you have a 30-pack-year history of smoking and currently smoke or have quit within the past 15 years.  Fecal occult blood test (FOBT) of the stool. You may have this test every year starting at age 61.  Flexible sigmoidoscopy or colonoscopy. You may have a sigmoidoscopy every 5 years or a colonoscopy every 10 years starting at age 45.  Prostate cancer screening. Recommendations will vary depending on your family history and other risks.  Hepatitis C blood test.  Hepatitis B blood test.  Sexually transmitted disease (STD) testing.  Diabetes screening. This is done by checking your blood sugar (glucose) after you have not eaten for a while (fasting). You may have this done every 1-3 years.  Abdominal aortic aneurysm (AAA) screening. You may need this if you are  a current or former smoker.  Osteoporosis. You may be screened starting at age 98 if you are at high risk. Talk with your health care provider about your test results, treatment options, and if necessary, the need for more tests. Vaccines  Your health care provider  may recommend certain vaccines, such as:  Influenza vaccine. This is recommended every year.  Tetanus, diphtheria, and acellular pertussis (Tdap, Td) vaccine. You may need a Td booster every 10 years.  Zoster vaccine. You may need this after age 60.  Pneumococcal 13-valent conjugate (PCV13) vaccine. One dose is recommended after age 19.  Pneumococcal polysaccharide (PPSV23) vaccine. One dose is recommended after age 66. Talk to your health care provider about which screenings and vaccines you need and how often you need them. This information is not intended to replace advice given to you by your health care provider. Make sure you discuss any questions you have with your health care provider. Document Released: 11/08/2015 Document Revised: 07/01/2016 Document Reviewed: 08/13/2015 Elsevier Interactive Patient Education  2017 [place] Prevention in the Home Falls can cause injuries. They can happen to people of all ages. There are many things you can do to make your home safe and to help prevent falls. What can I do on the outside of my home?  Regularly fix the edges of walkways and driveways and fix any cracks.  Remove anything that might make you trip as you walk through a door, such as a raised step or threshold.  Trim any bushes or trees on the path to your home.  Use bright outdoor lighting.  Clear any walking paths of anything that might make someone trip, such as rocks or tools.  Regularly check to see if handrails are loose or broken. Make sure that both sides of any steps have handrails.  Any raised decks and porches should have guardrails on the edges.  Have any leaves, snow, or ice cleared regularly.  Use sand or salt on walking paths during winter.  Clean up any spills in your garage right away. This includes oil or grease spills. What can I do in the bathroom?  Use night lights.  Install grab bars by the toilet and in the tub and shower. Do not use  towel bars as grab bars.  Use non-skid mats or decals in the tub or shower.  If you need to sit down in the shower, use a plastic, non-slip stool.  Keep the floor dry. Clean up any water that spills on the floor as soon as it happens.  Remove soap buildup in the tub or shower regularly.  Attach bath mats securely with double-sided non-slip rug tape.  Do not have throw rugs and other things on the floor that can make you trip. What can I do in the bedroom?  Use night lights.  Make sure that you have a light by your bed that is easy to reach.  Do not use any sheets or blankets that are too big for your bed. They should not hang down onto the floor.  Have a firm chair that has side arms. You can use this for support while you get dressed.  Do not have throw rugs and other things on the floor that can make you trip. What can I do in the kitchen?  Clean up any spills right away.  Avoid walking on wet floors.  Keep items that you use a lot in easy-to-reach places.  If you need to reach something  above you, use a strong step stool that has a grab bar.  Keep electrical cords out of the way.  Do not use floor polish or wax that makes floors slippery. If you must use wax, use non-skid floor wax.  Do not have throw rugs and other things on the floor that can make you trip. What can I do with my stairs?  Do not leave any items on the stairs.  Make sure that there are handrails on both sides of the stairs and use them. Fix handrails that are broken or loose. Make sure that handrails are as long as the stairways.  Check any carpeting to make sure that it is firmly attached to the stairs. Fix any carpet that is loose or worn.  Avoid having throw rugs at the top or bottom of the stairs. If you do have throw rugs, attach them to the floor with carpet tape.  Make sure that you have a light switch at the top of the stairs and the bottom of the stairs. If you do not have them, ask  someone to add them for you. What else can I do to help prevent falls?  Wear shoes that:  Do not have high heels.  Have rubber bottoms.  Are comfortable and fit you well.  Are closed at the toe. Do not wear sandals.  If you use a stepladder:  Make sure that it is fully opened. Do not climb a closed stepladder.  Make sure that both sides of the stepladder are locked into place.  Ask someone to hold it for you, if possible.  Clearly mark and make sure that you can see:  Any grab bars or handrails.  First and last steps.  Where the edge of each step is.  Use tools that help you move around (mobility aids) if they are needed. These include:  Canes.  Walkers.  Scooters.  Crutches.  Turn on the lights when you go into a dark area. Replace any light bulbs as soon as they burn out.  Set up your furniture so you have a clear path. Avoid moving your furniture around.  If any of your floors are uneven, fix them.  If there are any pets around you, be aware of where they are.  Review your medicines with your doctor. Some medicines can make you feel dizzy. This can increase your chance of falling. Ask your doctor what other things that you can do to help prevent falls. This information is not intended to replace advice given to you by your health care provider. Make sure you discuss any questions you have with your health care provider. Document Released: 08/08/2009 Document Revised: 03/19/2016 Document Reviewed: 11/16/2014 Elsevier Interactive Patient Education  2017 [employer].

## 2020-03-07 ENCOUNTER — Other Ambulatory Visit: Payer: Self-pay | Admitting: Family Medicine

## 2020-03-07 NOTE — Telephone Encounter (Signed)
Pt is requesting Zolpidem Tartrate 5mg  tab #15  #0 refills  Last refill 09/07/2019  Approve?

## 2020-03-07 NOTE — Telephone Encounter (Signed)
Responding to the request below:  LOV: 12/25/2019 Next Visit: 07/24/2020

## 2020-04-01 ENCOUNTER — Other Ambulatory Visit: Payer: Self-pay | Admitting: Family Medicine

## 2020-04-30 ENCOUNTER — Encounter: Payer: Self-pay | Admitting: Family Medicine

## 2020-05-17 ENCOUNTER — Encounter: Payer: Self-pay | Admitting: Family Medicine

## 2020-06-11 ENCOUNTER — Encounter: Payer: Self-pay | Admitting: Family Medicine

## 2020-06-13 DIAGNOSIS — L814 Other melanin hyperpigmentation: Secondary | ICD-10-CM | POA: Diagnosis not present

## 2020-06-13 DIAGNOSIS — L570 Actinic keratosis: Secondary | ICD-10-CM | POA: Diagnosis not present

## 2020-06-13 DIAGNOSIS — D2272 Melanocytic nevi of left lower limb, including hip: Secondary | ICD-10-CM | POA: Diagnosis not present

## 2020-06-13 DIAGNOSIS — D225 Melanocytic nevi of trunk: Secondary | ICD-10-CM | POA: Diagnosis not present

## 2020-06-13 DIAGNOSIS — D2372 Other benign neoplasm of skin of left lower limb, including hip: Secondary | ICD-10-CM | POA: Diagnosis not present

## 2020-06-13 DIAGNOSIS — D2271 Melanocytic nevi of right lower limb, including hip: Secondary | ICD-10-CM | POA: Diagnosis not present

## 2020-06-13 DIAGNOSIS — D2261 Melanocytic nevi of right upper limb, including shoulder: Secondary | ICD-10-CM | POA: Diagnosis not present

## 2020-06-13 DIAGNOSIS — D1801 Hemangioma of skin and subcutaneous tissue: Secondary | ICD-10-CM | POA: Diagnosis not present

## 2020-06-13 DIAGNOSIS — D2262 Melanocytic nevi of left upper limb, including shoulder: Secondary | ICD-10-CM | POA: Diagnosis not present

## 2020-06-13 DIAGNOSIS — I788 Other diseases of capillaries: Secondary | ICD-10-CM | POA: Diagnosis not present

## 2020-06-13 DIAGNOSIS — L821 Other seborrheic keratosis: Secondary | ICD-10-CM | POA: Diagnosis not present

## 2020-06-17 DIAGNOSIS — Z23 Encounter for immunization: Secondary | ICD-10-CM | POA: Diagnosis not present

## 2020-06-19 ENCOUNTER — Encounter: Payer: Self-pay | Admitting: Family Medicine

## 2020-07-10 ENCOUNTER — Encounter: Payer: Self-pay | Admitting: Family Medicine

## 2020-07-10 DIAGNOSIS — M25561 Pain in right knee: Secondary | ICD-10-CM

## 2020-07-22 ENCOUNTER — Other Ambulatory Visit: Payer: Self-pay

## 2020-07-22 ENCOUNTER — Ambulatory Visit (INDEPENDENT_AMBULATORY_CARE_PROVIDER_SITE_OTHER)
Admission: RE | Admit: 2020-07-22 | Discharge: 2020-07-22 | Disposition: A | Discharge status: Home / Self Care | Payer: Medicare Other | Source: Ambulatory Visit | Attending: Family Medicine | Admitting: Family Medicine

## 2020-07-22 DIAGNOSIS — M25561 Pain in right knee: Secondary | ICD-10-CM | POA: Diagnosis not present

## 2020-07-24 ENCOUNTER — Ambulatory Visit (INDEPENDENT_AMBULATORY_CARE_PROVIDER_SITE_OTHER): Payer: Medicare Other | Admitting: Family Medicine

## 2020-07-24 ENCOUNTER — Other Ambulatory Visit: Payer: Self-pay

## 2020-07-24 ENCOUNTER — Encounter: Payer: Self-pay | Admitting: Family Medicine

## 2020-07-24 VITALS — BP 138/80 | HR 70 | Temp 97.3°F | Ht 67.75 in | Wt 187.2 lb

## 2020-07-24 DIAGNOSIS — E782 Mixed hyperlipidemia: Secondary | ICD-10-CM

## 2020-07-24 DIAGNOSIS — I10 Essential (primary) hypertension: Secondary | ICD-10-CM | POA: Diagnosis not present

## 2020-07-24 DIAGNOSIS — Z23 Encounter for immunization: Secondary | ICD-10-CM | POA: Diagnosis not present

## 2020-07-24 DIAGNOSIS — G8929 Other chronic pain: Secondary | ICD-10-CM

## 2020-07-24 DIAGNOSIS — M25561 Pain in right knee: Secondary | ICD-10-CM | POA: Diagnosis not present

## 2020-07-24 MED ORDER — ZOLPIDEM TARTRATE 5 MG PO TABS
5.0000 mg | ORAL_TABLET | Freq: Every evening | ORAL | 0 refills | Status: DC | PRN
Start: 2020-07-24 — End: 2021-01-27

## 2020-07-24 NOTE — Progress Notes (Signed)
Patient: Clinton Newton MRN: [id number] DOB: 10/04/1951 PCP: Orma Flaming, MD     Subjective:  Chief Complaint  Patient presents with  . Hypertension  . Hyperlipidemia  . right knee pain    HPI: The patient is a 69 y.o. male who presents today for chronic follow up with new complaints of right knee pain.   Hypertension: Here for follow up of hypertension.  Currently on norvasc 10mg /day and bystolic 2.5mg /day . Home readings range from 038 UEKCMKLK/91 diastolic. Takes medication as prescribed and denies any side effects. Exercise includes nothing due to knee pain. He was walking 3 miles a day or more, but since his knee started hurting he isn't able to walk. He also has not been back to pool since the knee started hurting him either. Weight has been stable. Denies any chest pain, headaches, shortness of breath, vision changes, swelling in lower extremities.   Hyperlipidemia Currently on crestor 20mg /day. Takes medication as prescribed. Due for fasting labs today.   The 10-year ASCVD risk score Mikey Bussing DC Jr., et al., 2013) is: 16.3%  Right knee pain Started about 10 weeks ago. He can't remember having pain in this knee like this before, but has knee pain in his chart from 6 years ago. He started to do some different moves in the pool and could haven a trigger for his knee pain. Pain can be a 10/10 sharp/throbbing. Does not radiate and is deep inside knee. He is able to walk short distances, but can't do 3+ miles like he was doing. No swelling or redness. He has good range of motion. Can flex to 90 and extend to 180, but does have pain in this area. Has tried voltaren gel. Has had to take oxycodone 3x for the pain, motrin, naproxyn. None of this has helped.     Review of Systems  Constitutional: Negative for chills, fatigue and fever.  HENT: Negative for dental problem, ear pain, hearing loss and trouble swallowing.   Eyes: Negative for visual disturbance.  Respiratory: Negative for  cough, chest tightness and shortness of breath.   Cardiovascular: Negative for chest pain, palpitations and leg swelling.  Gastrointestinal: Negative for abdominal pain, blood in stool, diarrhea and nausea.  Endocrine: Negative for cold intolerance, polydipsia, polyphagia and polyuria.  Genitourinary: Negative for dysuria and hematuria.  Musculoskeletal: Positive for arthralgias (right knee ). Negative for gait problem and joint swelling.  Skin: Negative for rash.  Neurological: Negative for dizziness and headaches.  Psychiatric/Behavioral: Negative for dysphoric mood and sleep disturbance. The patient is not nervous/anxious.     Allergies Patient has No Known Allergies.  Past Medical History Patient  has a past medical history of Arthritis, Cataract, Colon polyps, Diverticulitis, GERD (gastroesophageal reflux disease), Hyperlipidemia, and Hypertension.  Surgical History Patient  has a past surgical history that includes Appendectomy; Colonoscopy; Polypectomy; and Pre-malignant / benign skin lesion excision.  Family History Pateint's family history includes Heart disease in his father.  Social History Patient  reports that he has never smoked. He has never used smokeless tobacco. He reports current alcohol use of about 7.0 standard drinks of alcohol per week. He reports that he does not use drugs.    Objective: Vitals:   07/24/20 0823 07/24/20 0847  BP: (!) 142/81 138/80  Pulse: 70   Temp: (!) 97.3 F (36.3 C)   TempSrc: Temporal   SpO2: 98%   Weight: 187 lb 3.2 oz (84.9 kg)   Height: 5' 7.75" (1.721 m)     Body  mass index is 28.67 kg/m.  Physical Exam Vitals reviewed.  Constitutional:      Appearance: Normal appearance. He is normal weight.  HENT:     Head: Normocephalic and atraumatic.     Right Ear: Tympanic membrane, ear canal and external ear normal.     Left Ear: Tympanic membrane, ear canal and external ear normal.     Mouth/Throat:     Mouth: Mucous  membranes are moist.  Neck:     Vascular: No carotid bruit.  Cardiovascular:     Rate and Rhythm: Normal rate and regular rhythm.     Pulses: Normal pulses.     Heart sounds: Normal heart sounds.  Pulmonary:     Effort: Pulmonary effort is normal.     Breath sounds: Normal breath sounds.  Abdominal:     General: Abdomen is flat. Bowel sounds are normal.     Palpations: Abdomen is soft.  Musculoskeletal:        General: Tenderness present.     Cervical back: Normal range of motion and neck supple.     Comments: Right knee: TTP over patella femoral border, more so medially. No edema, no erythema. full rom with 90 degree flexion and 180 degree extension. Negative draw signs. Full weight bearing and no limp   Skin:    Capillary Refill: Capillary refill takes less than 2 seconds.  Neurological:     General: No focal deficit present.     Mental Status: He is alert and oriented to person, place, and time.  Psychiatric:        Mood and Affect: Mood normal.        Behavior: Behavior normal.      Office Visit from 07/24/2020 in [facility]  PHQ-2 Total Score 0     Reviewed knee xray     Assessment/plan: 1. Essential hypertension Blood pressure is to goal. Continue current anti-hypertensive medications-continue norvasc 10mg  daily and bystolic 2.5mg  daily. Refills given and routine lab work will be done today. Recommended routine exercise and healthy diet including DASH diet and mediterranean diet. Encouraged weight loss. F/u in 6 months.   - Microalbumin / creatinine urine ratio - COMPLETE METABOLIC PANEL WITH GFR; Future - CBC with Differential/Platelet; Future - COMPLETE METABOLIC PANEL WITH GFR - CBC with Differential/Platelet - Microalbumin / creatinine urine ratio  2. Chronic pain of right knee Has some minimal degenerative changes in right knee.  Tiny patellar spurs. likely will do well with injection. Referral to ortho.  - Ambulatory referral to  Orthopedics  3. Mixed hyperlipidemia Continue medication labs today .  - Lipid panel; Future - Lipid panel  4. Need for immunization against influenza  - Flu Vaccine QUAD High Dose(Fluad)  Had covid booster.   This visit occurred during the SARS-CoV-2 public health emergency.  Safety protocols were in place, including screening questions prior to the visit, additional usage of staff PPE, and extensive cleaning of exam room while observing appropriate contact time as indicated for disinfecting solutions.     Return in about 6 months (around 01/21/2021) for htn .     Orma Flaming, MD [place]  07/24/2020

## 2020-07-24 NOTE — Patient Instructions (Addendum)
-  checking labs -referral to ortho for knee pain. I think an injection would be helpful..  -watch your blood pressure.  -sent in [place].   You look great! i'll see you in 6 months unless you need something.  -need echo of your heart in December for aortic root dilation. Send me a reminder!    Happy fall, stay safe! Dr. Rogers Blocker

## 2020-07-25 LAB — CBC WITH DIFFERENTIAL/PLATELET
Absolute Monocytes: 496 cells/uL (ref 200–950)
Basophils Absolute: 29 cells/uL (ref 0–200)
Basophils Relative: 0.7 %
Eosinophils Absolute: 189 cells/uL (ref 15–500)
Eosinophils Relative: 4.6 %
HCT: 48.5 % (ref 38.5–50.0)
Hemoglobin: 16.5 g/dL (ref 13.2–17.1)
Lymphs Abs: 1320 cells/uL (ref 850–3900)
MCH: 32.3 pg (ref 27.0–33.0)
MCHC: 34 g/dL (ref 32.0–36.0)
MCV: 94.9 fL (ref 80.0–100.0)
MPV: 10.8 fL (ref 7.5–12.5)
Monocytes Relative: 12.1 %
Neutro Abs: 2066 cells/uL (ref 1500–7800)
Neutrophils Relative %: 50.4 %
Platelets: 218 10*3/uL (ref 140–400)
RBC: 5.11 10*6/uL (ref 4.20–5.80)
RDW: 12.5 % (ref 11.0–15.0)
Total Lymphocyte: 32.2 %
WBC: 4.1 10*3/uL (ref 3.8–10.8)

## 2020-07-25 LAB — COMPLETE METABOLIC PANEL WITH GFR
AG Ratio: 2 (calc) (ref 1.0–2.5)
ALT: 47 U/L — ABNORMAL HIGH (ref 9–46)
AST: 36 U/L — ABNORMAL HIGH (ref 10–35)
Albumin: 4.7 g/dL (ref 3.6–5.1)
Alkaline phosphatase (APISO): 64 U/L (ref 35–144)
BUN: 10 mg/dL (ref 7–25)
CO2: 29 mmol/L (ref 20–32)
Calcium: 9.4 mg/dL (ref 8.6–10.3)
Chloride: 105 mmol/L (ref 98–110)
Creat: 0.74 mg/dL (ref 0.70–1.25)
GFR, Est African American: 111 mL/min/{1.73_m2} (ref >=60)
GFR, Est Non African American: 95 mL/min/{1.73_m2} (ref >=60)
Globulin: 2.4 g/dL (calc) (ref 1.9–3.7)
Glucose, Bld: 107 mg/dL — ABNORMAL HIGH (ref 65–99)
Potassium: 4.2 mmol/L (ref 3.5–5.3)
Sodium: 142 mmol/L (ref 135–146)
Total Bilirubin: 0.6 mg/dL (ref 0.2–1.2)
Total Protein: 7.1 g/dL (ref 6.1–8.1)

## 2020-07-25 LAB — LIPID PANEL
Cholesterol: 147 mg/dL (ref <200)
HDL: 51 mg/dL (ref >=40)
LDL Cholesterol (Calc): 67 mg/dL (calc)
Non-HDL Cholesterol (Calc): 96 mg/dL (calc) (ref <130)
Total CHOL/HDL Ratio: 2.9 (calc) (ref <5.0)
Triglycerides: 233 mg/dL — ABNORMAL HIGH (ref <150)

## 2020-08-05 ENCOUNTER — Ambulatory Visit (INDEPENDENT_AMBULATORY_CARE_PROVIDER_SITE_OTHER): Payer: Medicare Other | Admitting: Orthopaedic Surgery

## 2020-08-05 ENCOUNTER — Encounter: Payer: Self-pay | Admitting: Orthopaedic Surgery

## 2020-08-05 VITALS — Ht 67.75 in | Wt 187.2 lb

## 2020-08-05 DIAGNOSIS — G8929 Other chronic pain: Secondary | ICD-10-CM

## 2020-08-05 DIAGNOSIS — M25561 Pain in right knee: Secondary | ICD-10-CM

## 2020-08-05 MED ORDER — METHYLPREDNISOLONE ACETATE 40 MG/ML IJ SUSP
40.0000 mg | INTRAMUSCULAR | Status: AC | PRN
  Administered 2020-08-05: 40 mg via INTRA_ARTICULAR

## 2020-08-05 MED ORDER — LIDOCAINE HCL 1 % IJ SOLN
3.0000 mL | INTRAMUSCULAR | Status: AC | PRN
  Administered 2020-08-05: 3 mL

## 2020-08-05 NOTE — Progress Notes (Signed)
Office Visit Note   Patient: Clinton Newton           Date of Birth: 03/15/1951           MRN: [id number] Visit Date: 08/05/2020              Requested by: Orma Flaming, [address] [place],  [place] [address] PCP: Orma Flaming, MD   Assessment & Plan: Visit Diagnoses:  1. Chronic pain of right knee     Plan: I am recommending a steroid injection in his right knee today and he is interested and a MRI of the knee to rule out a meniscal tear given his continued locking catching.  I agree with his plan as well.  He did tolerate the steroid injection well.  We will see him back in 2 weeks to hopefully go over a MRI of his right knee.  All question concerns were answered and addressed.  Follow-Up Instructions: Return in about 2 weeks (around 08/19/2020).   Orders:  Orders Placed This Encounter  Procedures  . Large Joint Inj   No orders of the defined types were placed in this encounter.     Procedures: Large Joint Inj: R knee on 08/05/2020 2:46 PM Indications: diagnostic evaluation and pain Details: 22 G 1.5 in needle, superolateral approach  Arthrogram: No  Medications: 3 mL lidocaine 1 %; 40 mg methylPREDNISolone acetate 40 MG/ML Outcome: tolerated well, no immediate complications Procedure, treatment alternatives, risks and benefits explained, specific risks discussed. Consent was given by the patient. Immediately prior to procedure a time out was called to verify the correct patient, procedure, equipment, support staff and site/side marked as required. Patient was prepped and draped in the usual sterile fashion.       Clinical Data: No additional findings.   Subjective: Chief Complaint  Patient presents with  . Right Knee - Pain  The patient is a very pleasant and active 69 year old gentleman who comes in with right knee pain with swelling and locking and catching.  He says pivoting activities hurt him the worst.  He has tried activity modification with  rest of the knee.  He is worked on Hotel manager the knee with a home exercise program.  He is tried over-the-counter anti-inflammatories and other medications and this has not helped.  He used to participate in water aerobics and is gotten to where he can do that either.  Going up and down stairs is not a issue is much as it is with pivoting with that knee.  He has not had any type of injection in the knee and has never had surgery on that right knee.  He is not a diabetic and denies any acute medical issues.  HPI  Review of Systems He currently denies any headache, chest pain, shortness of breath, fever, chills, nausea, vomiting  Objective: Vital Signs: Ht 5' 7.75" (1.721 m)   Wt 187 lb 3.2 oz (84.9 kg)   BMI 28.67 kg/m   Physical Exam He is alert and oriented x3 and in no acute distress Ortho Exam Examination of his right knee shows a positive Murray sign the medial compartment of the knee.  There is a mild effusion.  He has good range of motion of the knee and it is ligamentously stable. Specialty Comments:  No specialty comments available.  Imaging: No results found. X-rays independently reviewed on the canopy system of the right knee show well-maintained joint space with no complicating features.  PMFS History: Patient Active  Problem List   Diagnosis Date Noted  . Ascending aorta dilation (HCC) 10/12/2019  . Lumbar facet arthropathy 11/20/2016  . Lumbar degenerative disc disease 11/20/2016  . Chronic right-sided low back pain without sciatica 11/20/2016  . Reflex sympathetic dystrophy 10/29/2016  . Knee pain - sees ortho 03/14/2014  . Hyperlipemia 08/31/2007  . Essential hypertension 08/31/2007   Past Medical History:  Diagnosis Date  . Arthritis    L3-L4  . Cataract   . Colon polyps   . Diverticulitis   . GERD (gastroesophageal reflux disease)   . Hyperlipidemia   . Hypertension     Family History  Problem Relation Age of Onset  . Heart disease Father   .  Colon cancer Neg Hx   . Pancreatic cancer Neg Hx   . Rectal cancer Neg Hx   . Stomach cancer Neg Hx   . Esophageal cancer Neg Hx     Past Surgical History:  Procedure Laterality Date  . APPENDECTOMY    . COLONOSCOPY    . POLYPECTOMY    . PRE-MALIGNANT / BENIGN SKIN LESION EXCISION     Social History   Occupational History  . Occupation: Retired  Tobacco Use  . Smoking status: Never Smoker  . Smokeless tobacco: Never Used  Vaping Use  . Vaping Use: Never used  Substance and Sexual Activity  . Alcohol use: Yes    Alcohol/week: 7.0 standard drinks    Types: 7 Glasses of wine per week  . Drug use: No  . Sexual activity: Yes

## 2020-08-07 ENCOUNTER — Other Ambulatory Visit: Payer: Self-pay

## 2020-08-07 DIAGNOSIS — G8929 Other chronic pain: Secondary | ICD-10-CM

## 2020-08-08 ENCOUNTER — Telehealth: Payer: Self-pay | Admitting: Orthopaedic Surgery

## 2020-08-08 NOTE — Telephone Encounter (Signed)
Called patient left message to return call to reschedule his MRI review appointment    Schedule after 08/26/2020 with Dr Ninfa Linden

## 2020-08-19 ENCOUNTER — Ambulatory Visit: Payer: Medicare Other | Admitting: Orthopaedic Surgery

## 2020-08-26 ENCOUNTER — Ambulatory Visit
Admission: RE | Admit: 2020-08-26 | Discharge: 2020-08-26 | Disposition: A | Discharge status: Home / Self Care | Payer: Medicare Other | Source: Ambulatory Visit | Attending: Orthopaedic Surgery | Admitting: Orthopaedic Surgery

## 2020-08-26 ENCOUNTER — Other Ambulatory Visit: Payer: Self-pay

## 2020-08-26 DIAGNOSIS — G8929 Other chronic pain: Secondary | ICD-10-CM

## 2020-08-26 DIAGNOSIS — M25561 Pain in right knee: Secondary | ICD-10-CM | POA: Diagnosis not present

## 2020-08-27 ENCOUNTER — Encounter: Payer: Self-pay | Admitting: Family Medicine

## 2020-08-28 ENCOUNTER — Other Ambulatory Visit: Payer: Self-pay

## 2020-08-29 ENCOUNTER — Encounter: Payer: Self-pay | Admitting: Orthopaedic Surgery

## 2020-08-29 ENCOUNTER — Telehealth: Payer: Self-pay

## 2020-08-29 ENCOUNTER — Ambulatory Visit (INDEPENDENT_AMBULATORY_CARE_PROVIDER_SITE_OTHER): Payer: Medicare Other | Admitting: Orthopaedic Surgery

## 2020-08-29 DIAGNOSIS — M25561 Pain in right knee: Secondary | ICD-10-CM | POA: Diagnosis not present

## 2020-08-29 DIAGNOSIS — G8929 Other chronic pain: Secondary | ICD-10-CM

## 2020-08-29 DIAGNOSIS — M1711 Unilateral primary osteoarthritis, right knee: Secondary | ICD-10-CM

## 2020-08-29 NOTE — Telephone Encounter (Signed)
Please get right knee gel injections approved -Dr. Ninfa Linden pt

## 2020-08-29 NOTE — Progress Notes (Signed)
Here today to go over a MRI of his right knee.  He is 69 years old and very active.  He does have a hard shoe upstairs in his house who does go up and down stairs all day long.  He is also active with walking at [place] and [place].  He used to do a lot of water aerobics but he has not done that for about 3 months.  We originally saw him his plain films were pretty normal for his right knee but with locking catching during pivoting activities we felt MRI was warranted.  We did place a steroid injection in his right knee and he said that helped him quite a bit.  The right knee has no effusion today.  There is pain with the patella tracks to flexion-extension when I put compression on it.  His ligamentous exam and McMurray's exams are all negative.  MRIs reviewed with him.  There is an area of full-thickness cartilage loss at the lateral trochlear groove which is about 1.1 cm.  The remainder of his knee shows just some mild arthritic changes and no meniscal tear.  He is can work on strengthening his VMO and avoid going up and down hills during his exercise walks.  He is a perfect candidate for hyaluronic acid and he agrees to trying this as well.  We will work on ordering it and call him when it is approved and available for an injection in his right knee.  All question concerns were answered and addressed.

## 2020-08-30 NOTE — Telephone Encounter (Signed)
Noted  

## 2020-09-27 ENCOUNTER — Telehealth: Payer: Self-pay

## 2020-09-27 NOTE — Telephone Encounter (Signed)
VOB submitted for SynviscOne, right knee.  

## 2020-09-30 ENCOUNTER — Telehealth: Payer: Self-pay

## 2020-09-30 NOTE — Telephone Encounter (Signed)
Called and left a Vm for patient to call back to schedule an appointment with Dr. Ninfa Linden for gel injection.  Approved, SynviscOne, right knee. [place] deductible has been met Covered at 100% after Medicare pays  No Co-pay No PA required

## 2020-10-04 ENCOUNTER — Other Ambulatory Visit: Payer: Self-pay

## 2020-10-04 ENCOUNTER — Ambulatory Visit (INDEPENDENT_AMBULATORY_CARE_PROVIDER_SITE_OTHER): Payer: Medicare Other | Admitting: Family Medicine

## 2020-10-04 ENCOUNTER — Encounter: Payer: Self-pay | Admitting: Family Medicine

## 2020-10-04 VITALS — BP 128/76 | HR 77 | Temp 97.7°F | Ht 67.75 in | Wt 190.8 lb

## 2020-10-04 DIAGNOSIS — G8929 Other chronic pain: Secondary | ICD-10-CM

## 2020-10-04 DIAGNOSIS — M25511 Pain in right shoulder: Secondary | ICD-10-CM

## 2020-10-04 NOTE — Progress Notes (Signed)
Patient: Clinton Newton MRN: [id number] DOB: 06-04-1951 PCP: Orma Flaming, MD     Subjective:  Chief Complaint  Patient presents with  . Shoulder Pain    Right sided; when reaching.    HPI: The patient is a 69 y.o. male who presents today for right shoulder pain, when reaching that started 3 months ago. He states he has pain with reach back and up in the posterior aspect of his shoulder. He has no pain with upward movement in front of him. He has pain if he moves his arm behind his back. He is right handed. He denies any trauma, work out or over use. He will occasionally lie on his right side and the pain wakes him up. He has tried anything for the pain that is otc. Pain is rated as a 8/10 and is sharp in nature when he has it. Pretty much any overhead movement is painful.     Review of Systems  Constitutional: Negative for chills, fatigue and fever.  Musculoskeletal: Positive for arthralgias (right shoulder pain ). Negative for joint swelling, neck pain and neck stiffness.  Neurological: Negative for dizziness and headaches.    Allergies Patient has No Known Allergies.  Past Medical History Patient  has a past medical history of Arthritis, Cataract, Colon polyps, Diverticulitis, GERD (gastroesophageal reflux disease), Hyperlipidemia, and Hypertension.  Surgical History Patient  has a past surgical history that includes Appendectomy; Colonoscopy; Polypectomy; and Pre-malignant / benign skin lesion excision.  Family History Pateint's family history includes Heart disease in his father.  Social History Patient  reports that he has never smoked. He has never used smokeless tobacco. He reports current alcohol use of about 7.0 standard drinks of alcohol per week. He reports that he does not use drugs.    Objective: Vitals:   10/04/20 1017  BP: 128/76  Pulse: 77  Temp: 97.7 F (36.5 C)  TempSrc: Temporal  SpO2: 97%  Weight: 190 lb 12.8 oz (86.5 kg)  Height: 5' 7.75" (1.721  m)    Body mass index is 29.23 kg/m.  Physical Exam Vitals reviewed.  Constitutional:      Appearance: Normal appearance. He is normal weight.  Pulmonary:     Effort: Pulmonary effort is normal.  Musculoskeletal:        General: Tenderness present.     Comments: Right shoulder +passive arc, +gerber, +neers +empty can.  TTP at posterior aspect of humeral head (teres minor/infraspintus)  Neurological:     Mental Status: He is alert.        Assessment/plan: 1. Chronic right shoulder pain Concern for rotator cuff pathology vs. Frozen shoulder. Do not have xray here so will refer to sports med so they can xray/inject if needed and further eval/treat. Declines any medication at this time. Discussed if needs PT we can do here.  - Ambulatory referral to Sports Medicine   This visit occurred during the SARS-CoV-2 public health emergency.  Safety protocols were in place, including screening questions prior to the visit, additional usage of staff PPE, and extensive cleaning of exam room while observing appropriate contact time as indicated for disinfecting solutions.      Return if symptoms worsen or fail to improve.   Orma Flaming, MD [place]   10/04/2020

## 2020-10-04 NOTE — Patient Instructions (Signed)
Concern for rotator cuff pathology or frozen shoulder. Referral to sports med done so they can do everything (xray/inject/) MRI if needed. We do have PT here if needed.   Mikki Santee!!! Give beth a hug! Dr. Rogers Blocker

## 2020-10-09 ENCOUNTER — Ambulatory Visit (INDEPENDENT_AMBULATORY_CARE_PROVIDER_SITE_OTHER): Payer: Medicare Other | Admitting: Orthopaedic Surgery

## 2020-10-09 ENCOUNTER — Encounter: Payer: Self-pay | Admitting: Orthopaedic Surgery

## 2020-10-09 DIAGNOSIS — G8929 Other chronic pain: Secondary | ICD-10-CM

## 2020-10-09 DIAGNOSIS — M25561 Pain in right knee: Secondary | ICD-10-CM

## 2020-10-09 DIAGNOSIS — M1711 Unilateral primary osteoarthritis, right knee: Secondary | ICD-10-CM | POA: Diagnosis not present

## 2020-10-09 MED ORDER — HYLAN G-F 20 48 MG/6ML IX SOSY
48.0000 mg | PREFILLED_SYRINGE | INTRA_ARTICULAR | Status: AC | PRN
  Administered 2020-10-09: 48 mg via INTRA_ARTICULAR

## 2020-10-09 NOTE — Progress Notes (Signed)
   Procedure Note  Patient: Clinton Newton             Date of Birth: 07/02/1951           MRN: [id number]             Visit Date: 10/09/2020  Procedures: Visit Diagnoses:  1. Chronic pain of right knee   2. Unilateral primary osteoarthritis, right knee     Large Joint Inj: R knee on 10/09/2020 3:16 PM Indications: pain and diagnostic evaluation Details: 22 G 1.5 in needle, superolateral approach  Arthrogram: No  Medications: 48 mg Hylan 48 MG/6ML Outcome: tolerated well, no immediate complications Procedure, treatment alternatives, risks and benefits explained, specific risks discussed. Consent was given by the patient. Immediately prior to procedure a time out was called to verify the correct patient, procedure, equipment, support staff and site/side marked as required. Patient was prepped and draped in the usual sterile fashion.    The patient is here today for scheduled hyaluronic acid injection in his right knee to treat the pain from osteoarthritis.  He has tried failed other forms of conservative treatment.  There is injection of the areas with Synvisc 1.  I explained the rationale behind injection such as this as well as the risk and benefits involved.  His right knee has no significant effusion today but a painful arc of motion.  Overall he is doing great.  I did place Synvisc 1 in his right knee without difficulty.  All questions and concerns were answered and addressed.  Follow-up will be as needed.

## 2020-10-14 ENCOUNTER — Encounter: Payer: Self-pay | Admitting: Family Medicine

## 2020-10-15 NOTE — Progress Notes (Signed)
Subjective:   I, Clinton Newton, LAT, ATC acting as a scribe for Clinton Leader, MD.  I'm seeing this patient as a consultation for Dr. Joya Newton. Note will be routed back to referring provider/PCP.  CC: Chronic right shoulder pain  HPI: Pt is a 69yo male c/o chronic pain in R shoulder. Pt R reports shoulder pain has been ongoing approx 3 months, but has been getting worse recently w/ no known MOI. Pt suspects throwing fetching toy for cat to exacerbate shoulder pain. Pt locates pain to all over [facility] joint and describes pain as sharp.   UE numbness/tingling: none UE weakness: yes- when hurting Aggravates: ER, overhead, extension Rx tried: none  Past medical history, Surgical history, Family history, Social history, Allergies, and medications have been entered into the medical record, reviewed.   Review of Systems: No new headache, visual changes, nausea, vomiting, diarrhea, constipation, dizziness, abdominal pain, skin rash, fevers, chills, night sweats, weight loss, swollen lymph nodes, body aches, joint swelling, muscle aches, chest pain, shortness of breath, mood changes, visual or auditory hallucinations.   Objective:    Vitals:   10/16/20 1537  BP: (!) 142/86  Pulse: 73  SpO2: 97%   General: Well Developed, well nourished, and in no acute distress.  Neuro/Psych: Alert and oriented x3, extra-ocular muscles intact, able to move all 4 extremities, sensation grossly intact. Skin: Warm and dry, no rashes noted.  Respiratory: Not using accessory muscles, speaking in full sentences, trachea midline.  Cardiovascular: Pulses palpable, no extremity edema. Abdomen: Does not appear distended. MSK: right shoulder normal-appearing Normal motion pain with abduction and internal rotation. Strength intact abduction external/internal rotation. Positive Hawkins and Neer's test. Positive empty can test. Negative Yergason's and speeds test.  Lab and Radiology Results Procedure: Real-time  Ultrasound Guided Injection of right shoulder subacromial bursa Device: Philips Affiniti 50G Images permanently stored and available for review in PACS Ultrasound examination of shoulder prior to injection reveals intact rotator cuff tendons and biceps tendon.  Mild subacromial bursitis present.  AC DJD is present Verbal informed consent obtained.  Discussed risks and benefits of procedure. Warned about infection bleeding damage to structures skin hypopigmentation and fat atrophy among others. Patient expresses understanding and agreement Time-out conducted.   Noted no overlying erythema, induration, or other signs of local infection.   Skin prepped in a sterile fashion.   Local anesthesia: Topical Ethyl chloride.   With sterile technique and under real time ultrasound guidance:  40 mg of Kenalog and 2 mL of Marcaine injected into subacromial bursa. Fluid seen entering the bursa.   Completed without difficulty   Pain immediately resolved suggesting accurate placement of the medication.   Advised to call if fevers/chills, erythema, induration, drainage, or persistent bleeding.   Images permanently stored and available for review in the ultrasound unit.  Impression: Technically successful ultrasound guided injection.     Impression and Recommendations:    Assessment and Plan: 69 y.o. male with right shoulder pain due to subacromial bursitis and rotator cuff tendinopathy.  Plan to treat with physical therapy and subacromial injection.. Recheck back in 8 weeks if not improved.  Return sooner if needed.  PDMP not reviewed this encounter. Orders Placed This Encounter  Procedures  . [place] LIMITED JOINT SPACE STRUCTURES UP RIGHT(NO LINKED CHARGES)    Standing Status:   Future    Number of Occurrences:   1    Standing Expiration Date:   04/16/2021    Order Specific Question:   Reason for  Exam (SYMPTOM  OR DIAGNOSIS REQUIRED)    Answer:   chronic right shoulder pain    Order Specific Question:    Preferred imaging location?    Answer:   [place]  . Ambulatory referral to Physical Therapy    Referral Priority:   Routine    Referral Type:   Physical Medicine    Referral Reason:   Specialty Services Required    Requested Specialty:   Physical Therapy   No orders of the defined types were placed in this encounter.   Discussed warning signs or symptoms. Please see discharge instructions. Patient expresses understanding.   The above documentation has been reviewed and is accurate and complete Clinton Newton, M.D.

## 2020-10-16 ENCOUNTER — Other Ambulatory Visit: Payer: Self-pay

## 2020-10-16 ENCOUNTER — Ambulatory Visit: Payer: Self-pay

## 2020-10-16 ENCOUNTER — Ambulatory Visit: Payer: Medicare Other | Admitting: Family Medicine

## 2020-10-16 ENCOUNTER — Ambulatory Visit (INDEPENDENT_AMBULATORY_CARE_PROVIDER_SITE_OTHER): Payer: Medicare Other | Admitting: Family Medicine

## 2020-10-16 VITALS — BP 142/86 | HR 73 | Ht 67.75 in | Wt 191.6 lb

## 2020-10-16 DIAGNOSIS — G8929 Other chronic pain: Secondary | ICD-10-CM

## 2020-10-16 DIAGNOSIS — M25511 Pain in right shoulder: Secondary | ICD-10-CM

## 2020-10-16 NOTE — Patient Instructions (Addendum)
Thank you for coming in today.  I've referred you to Physical Therapy.  Let us know if you don't hear from them in one week.  Call or go to the ER if you develop a large red swollen joint with extreme pain or oozing puss.   Recheck in 8 weeks if not better.   Let me know sooner if this is not working.

## 2020-10-23 ENCOUNTER — Encounter: Payer: Self-pay | Admitting: Family Medicine

## 2020-10-23 MED ORDER — NORTRIPTYLINE HCL 25 MG PO CAPS: 25.0000 mg | ORAL_CAPSULE | Freq: Every evening | ORAL | 2 refills | Status: DC | PRN

## 2020-10-23 NOTE — Addendum Note (Signed)
Addended by: Rodolph Bong on: 10/23/2020 01:05 PM   Modules accepted: Orders

## 2020-10-30 ENCOUNTER — Other Ambulatory Visit: Payer: Self-pay | Admitting: Family Medicine

## 2020-10-30 NOTE — Telephone Encounter (Signed)
Please advise regarding 90 day refill.

## 2020-11-05 ENCOUNTER — Encounter: Payer: Self-pay | Admitting: Family Medicine

## 2020-11-07 ENCOUNTER — Encounter: Payer: Self-pay | Admitting: Physical Therapy

## 2020-11-07 ENCOUNTER — Ambulatory Visit (INDEPENDENT_AMBULATORY_CARE_PROVIDER_SITE_OTHER): Payer: Medicare Other | Admitting: Physical Therapy

## 2020-11-07 ENCOUNTER — Other Ambulatory Visit: Payer: Self-pay

## 2020-11-07 DIAGNOSIS — M25511 Pain in right shoulder: Secondary | ICD-10-CM | POA: Diagnosis not present

## 2020-11-09 ENCOUNTER — Encounter: Payer: Self-pay | Admitting: Physical Therapy

## 2020-11-09 NOTE — Patient Instructions (Signed)
Shoulder flexion, cane x 10, 2x/day 2 hands behind back IR stretch 5 sec x 10, 2x/day

## 2020-11-10 NOTE — Therapy (Signed)
[facility] [address] [place], [place], [address] Phone: [phone]   Fax:  [phone]  Physical Therapy Evaluation  Patient Details  Name: Clinton Newton MRN: [id number] Date of Birth: [birth year] Referring Provider (PT): [name]   Encounter Date: 11/07/2020   PT End of Session - 11/09/20 1517    Visit Number 1    Number of Visits 12    Date for PT Re-Evaluation 12/19/20    Authorization Type Medicare    PT Start Time 1430    PT Stop Time 1515    PT Time Calculation (min) 45 min    Activity Tolerance Patient tolerated treatment well    Behavior During Therapy [facility] for tasks assessed/performed           Past Medical History:  Diagnosis Date  . Arthritis    L3-L4  . Cataract   . Colon polyps   . Diverticulitis   . GERD (gastroesophageal reflux disease)   . Hyperlipidemia   . Hypertension     Past Surgical History:  Procedure Laterality Date  . APPENDECTOMY    . COLONOSCOPY    . POLYPECTOMY    . PRE-MALIGNANT / BENIGN SKIN LESION EXCISION      There were no vitals filed for this visit.    Subjective Assessment - 11/09/20 1515    Subjective Pt with increased pain in R shoulder, thinks he hurt it with increased throwing. R handed. Pt is retired. Pain has improved some since MD visit. States most difficulty reaching out and behind.    Limitations House hold activities;Lifting    Patient Stated Goals Decreased pain    Currently in Pain? Yes    Pain Score 10-Worst pain ever    Pain Location Shoulder    Pain Orientation Right    Pain Descriptors / Indicators Sharp    Pain Type Acute pain    Pain Radiating Towards increased sharp pain up to 10/10 with reaching out and behind.    Pain Onset More than a month ago    Pain Frequency Intermittent    Aggravating Factors  reaching behind, sleeping              [facility] PT Assessment - 11/10/20 0001      Assessment   Medical Diagnosis R shoulder pain     Referring Provider (PT) [name]    Hand Dominance Right    Prior Therapy no      Balance Screen   Has the patient fallen in the past 6 months No      Prior Function   Level of Independence Independent      Cognition   Overall Cognitive Status Within Functional Limits for tasks assessed      AROM   Right Shoulder Flexion 130 Degrees    Right Shoulder Internal Rotation --   limited/painful IR behind the back/  to belt line     PROM   Right Shoulder Flexion 145 Degrees      Strength   Overall Strength Within functional limits for tasks performed    Right Shoulder Flexion 4/5    Right Shoulder ABduction 4-/5    Right Shoulder Internal Rotation 4/5    Right Shoulder External Rotation 4/5      Palpation   Palpation comment Hypomobile GHJ, mostly for IR;      Special Tests   Other special tests Pain with reaching out to side, behind back;  Objective measurements completed on examination: See above findings.       [facility] Adult PT Treatment/Exercise - 11/10/20 0001      Exercises   Exercises Shoulder      Shoulder Exercises: Supine   Internal Rotation 15 reps;AROM    Flexion AAROM;15 reps    Flexion Limitations cane      Shoulder Exercises: Stretch   Internal Rotation Stretch 5 reps    Internal Rotation Stretch Limitations 2 hands behind back 5 sec x 5;      Manual Therapy   Manual Therapy Joint mobilization;Soft tissue mobilization;Passive ROM    Joint Mobilization Post and Inf mobs gr 3;    Passive ROM R GHJ all motions                  PT Education - 11/09/20 1517    Education Details PT POC, Exam findings, HEP    Person(s) Educated Patient    Methods Explanation;Demonstration;Tactile cues;Verbal cues;Handout    Comprehension Verbalized understanding;Returned demonstration;Verbal cues required;Tactile cues required;Need further instruction            PT Short Term Goals - 11/09/20 1521      PT SHORT TERM GOAL  #1   Title Pt to be independent wtih initial HEP    Time 2    Period Weeks    Status New    Target Date 11/21/20      PT SHORT TERM GOAL #2   Title Pt to demo improved IR by at least 10 deg .    Time 2    Period Weeks    Status New    Target Date 11/21/20             PT Long Term Goals - 11/09/20 1523      PT LONG TERM GOAL #1   Title Pt to be indepedent with final HEP    Time 6    Period Weeks    Status New    Target Date 12/19/20      PT LONG TERM GOAL #2   Title Pt to demo ROM of R shoulder to be WNL for elevation, rotation, and IR behind the back, without pain, to improve ability for reaching and IADLs.    Time 6    Period Weeks    Status New    Target Date 12/19/20      PT LONG TERM GOAL #3   Title Pt to report decreased pain in R shoulder to 0-2/10 with activity    Time 6    Period Weeks    Status New    Target Date 12/19/20      PT LONG TERM GOAL #4   Title Pt to demo R shoulder strength to be at least 4+/5 to improve ability for lifting, carrying and IADLS.    Time 6    Period Weeks    Status New    Target Date 12/19/20                  Plan - 11/10/20 0810    Clinical Impression Statement Pt presents with primary complaint of increased pain in R shoulder. He has ROM limitations, as well as pain. IR behind back quite limited and painful. Pt with decreased ability for lifting, reaching, carrying and elevation. Pt to benefit from skilled PT to improve deficits and pain.    Examination-Activity Limitations Lift;Dressing;Carry;Reach Overhead    Examination-Participation Restrictions Cleaning;Community Activity;Driving;Shop;Yard Work    Risk analyst  Making Stable/Uncomplicated    Clinical Decision Making Low    Rehab Potential Good    PT Frequency 2x / week    PT Duration 6 weeks    PT Treatment/Interventions ADLs/Self Care Home Management;Cryotherapy;Electrical Stimulation;Iontophoresis 4mg /ml Dexamethasone;Moist  Heat;Traction;Ultrasound;DME Instruction;Therapeutic exercise;Functional mobility training;Neuromuscular re-education;Patient/family education;Passive range of motion;Dry needling;Taping;Vasopneumatic Device;Spinal Manipulations;Joint Manipulations    Consulted and Agree with Plan of Care Patient           Patient will benefit from skilled therapeutic intervention in order to improve the following deficits and impairments:  Decreased range of motion,Decreased activity tolerance,Pain,Hypomobility,Decreased mobility,Decreased strength  Visit Diagnosis: Acute pain of right shoulder     Problem List Patient Active Problem List   Diagnosis Date Noted  . Ascending aorta dilation (HCC) 10/12/2019  . Lumbar facet arthropathy 11/20/2016  . Lumbar degenerative disc disease 11/20/2016  . Chronic right-sided low back pain without sciatica 11/20/2016  . Reflex sympathetic dystrophy 10/29/2016  . Knee pain - sees ortho 03/14/2014  . Hyperlipemia 08/31/2007  . Essential hypertension 08/31/2007   Lyndee Hensen, PT, DPT 8:15 AM  11/10/20   [facility] [place], [place], [address] Phone: [phone]   Fax:  [phone]  Name: Clinton Newton MRN: [id number] Date of Birth: 1951/01/22

## 2020-11-13 ENCOUNTER — Ambulatory Visit (INDEPENDENT_AMBULATORY_CARE_PROVIDER_SITE_OTHER): Payer: Medicare Other | Admitting: Physical Therapy

## 2020-11-13 ENCOUNTER — Encounter: Payer: Self-pay | Admitting: Physical Therapy

## 2020-11-13 ENCOUNTER — Other Ambulatory Visit: Payer: Self-pay

## 2020-11-13 DIAGNOSIS — M25511 Pain in right shoulder: Secondary | ICD-10-CM

## 2020-11-13 NOTE — Patient Instructions (Signed)
Access Code: [email or web address] URL: [email or web address] Date: 11/13/2020 Prepared by: Lyndee Hensen  Exercises Supine Shoulder Flexion with Dowel - 1 x daily - 1 sets - 10 reps Standing Shoulder Scaption - 1 x daily - 1-2 sets - 10 reps Standing Shoulder Internal Rotation Stretch with Hands Behind Back - 2 x daily - 10 reps - 10 hold Standing Shoulder Posterior Capsule Stretch - 2 x daily - 3 reps - 10 hold Supine Shoulder Internal Rotation Stretch - 2 x daily - 1 sets - 10 reps - 5 hold Sidelying Shoulder ER with Towel and Dumbbell - 1 x daily - 1-2 sets - 10 reps Standing Row with Anchored Resistance - 1 x daily - 2 sets - 10 reps

## 2020-11-13 NOTE — Therapy (Signed)
[facility] [address] [place], [place], [address] Phone: [phone]   Fax:  [phone]  Physical Therapy Treatment  Patient Details  Name: Clinton Newton MRN: [id number] Date of Birth: [birth year] Referring Provider (PT): [name]   Encounter Date: 11/13/2020   PT End of Session - 11/13/20 1153    Visit Number 2    Number of Visits 12    Date for PT Re-Evaluation 12/19/20    Authorization Type Medicare    PT Start Time 1100    PT Stop Time 1140    PT Time Calculation (min) 40 min    Activity Tolerance Patient tolerated treatment well    Behavior During Therapy [facility] for tasks assessed/performed           Past Medical History:  Diagnosis Date  . Arthritis    L3-L4  . Cataract   . Colon polyps   . Diverticulitis   . GERD (gastroesophageal reflux disease)   . Hyperlipidemia   . Hypertension     Past Surgical History:  Procedure Laterality Date  . APPENDECTOMY    . COLONOSCOPY    . POLYPECTOMY    . PRE-MALIGNANT / BENIGN SKIN LESION EXCISION      There were no vitals filed for this visit.   Subjective Assessment - 11/13/20 1152    Subjective Pt states doing well, has been doing HEP, feels motion is improving.    Currently in Pain? Yes    Pain Score 7     Pain Location Shoulder    Pain Orientation Right    Pain Descriptors / Indicators Sharp    Pain Type Acute pain    Pain Onset More than a month ago    Pain Frequency Intermittent                             [facility] Adult PT Treatment/Exercise - 11/13/20 0001      Exercises   Exercises Shoulder      Shoulder Exercises: Supine   External Rotation 15 reps    External Rotation Weight (lbs) 2    Internal Rotation 15 reps;AROM    Flexion AAROM;15 reps    Flexion Limitations cane      Shoulder Exercises: Sidelying   External Rotation 15 reps    External Rotation Weight (lbs) 2      Shoulder Exercises: Standing   Flexion AROM;15 reps     ABduction 15 reps;AROM    Row 20 reps    Theraband Level (Shoulder Row) Level 2 (Red)      Shoulder Exercises: Pulleys   Flexion 3 minutes      Shoulder Exercises: Stretch   Internal Rotation Stretch 5 reps    Internal Rotation Stretch Limitations 2 hands behind back 5 sec x 5;  Towel x 10;      Manual Therapy   Manual Therapy Joint mobilization;Soft tissue mobilization;Passive ROM    Joint Mobilization Post and Inf mobs gr 3;    Passive ROM R GHJ all motions                    PT Short Term Goals - 11/09/20 1521      PT SHORT TERM GOAL #1   Title Pt to be independent wtih initial HEP    Time 2    Period Weeks    Status New    Target Date 11/21/20  PT SHORT TERM GOAL #2   Title Pt to demo improved IR by at least 10 deg .    Time 2    Period Weeks    Status New    Target Date 11/21/20             PT Long Term Goals - 11/09/20 1523      PT LONG TERM GOAL #1   Title Pt to be indepedent with final HEP    Time 6    Period Weeks    Status New    Target Date 12/19/20      PT LONG TERM GOAL #2   Title Pt to demo ROM of R shoulder to be WNL for elevation, rotation, and IR behind the back, without pain, to improve ability for reaching and IADLs.    Time 6    Period Weeks    Status New    Target Date 12/19/20      PT LONG TERM GOAL #3   Title Pt to report decreased pain in R shoulder to 0-2/10 with activity    Time 6    Period Weeks    Status New    Target Date 12/19/20      PT LONG TERM GOAL #4   Title Pt to demo R shoulder strength to be at least 4+/5 to improve ability for lifting, carrying and IADLS.    Time 6    Period Weeks    Status New    Target Date 12/19/20                 Plan - 11/13/20 1155    Clinical Impression Statement Pt with good ability for ther ex today. Improving ROM, and improving pain with IR. Plan to progress as tolerated .    Examination-Activity Limitations Lift;Dressing;Carry;Reach Overhead     Examination-Participation Restrictions Cleaning;Community Activity;Driving;Shop;Yard Work    Stability/Clinical Decision Making Stable/Uncomplicated    Rehab Potential Good    PT Frequency 2x / week    PT Duration 6 weeks    PT Treatment/Interventions ADLs/Self Care Home Management;Cryotherapy;Electrical Stimulation;Iontophoresis 4mg /ml Dexamethasone;Moist Heat;Traction;Ultrasound;DME Instruction;Therapeutic exercise;Functional mobility training;Neuromuscular re-education;Patient/family education;Passive range of motion;Dry needling;Taping;Vasopneumatic Device;Spinal Manipulations;Joint Manipulations    Consulted and Agree with Plan of Care Patient           Patient will benefit from skilled therapeutic intervention in order to improve the following deficits and impairments:  Decreased range of motion,Decreased activity tolerance,Pain,Hypomobility,Decreased mobility,Decreased strength  Visit Diagnosis: Acute pain of right shoulder     Problem List Patient Active Problem List   Diagnosis Date Noted  . Ascending aorta dilation (HCC) 10/12/2019  . Lumbar facet arthropathy 11/20/2016  . Lumbar degenerative disc disease 11/20/2016  . Chronic right-sided low back pain without sciatica 11/20/2016  . Reflex sympathetic dystrophy 10/29/2016  . Knee pain - sees ortho 03/14/2014  . Hyperlipemia 08/31/2007  . Essential hypertension 08/31/2007    Lyndee Hensen, PT, DPT 11:59 AM  11/13/20    [facility] [place], [place], [address] Phone: [phone]   Fax:  [phone]  Name: Clinton Newton MRN: [id number] Date of Birth: [birth year]

## 2020-11-19 ENCOUNTER — Encounter: Payer: Medicare Other | Admitting: Physical Therapy

## 2020-11-21 ENCOUNTER — Other Ambulatory Visit: Payer: Self-pay

## 2020-11-21 ENCOUNTER — Encounter: Payer: Self-pay | Admitting: Physical Therapy

## 2020-11-21 ENCOUNTER — Ambulatory Visit (INDEPENDENT_AMBULATORY_CARE_PROVIDER_SITE_OTHER): Payer: Medicare Other | Admitting: Physical Therapy

## 2020-11-21 DIAGNOSIS — M25511 Pain in right shoulder: Secondary | ICD-10-CM | POA: Diagnosis not present

## 2020-11-21 NOTE — Patient Instructions (Signed)
Access Code: [email or web address] URL: [email or web address] Date: 11/21/2020 Prepared by: Lyndee Hensen  Exercises Supine Shoulder Flexion with Dowel - 1 x daily - 1 sets - 10 reps Standing Shoulder Scaption - 1 x daily - 1-2 sets - 10 reps Standing Shoulder Internal Rotation Stretch with Hands Behind Back - 2 x daily - 10 reps - 10 hold Standing Shoulder Posterior Capsule Stretch - 2 x daily - 3 reps - 10 hold Standing Row with Anchored Resistance - 1 x daily - 2 sets - 10 reps Shoulder Internal Rotation with Resistance - 1 x daily - 2 sets - 10 reps Shoulder External Rotation with Anchored Resistance - 1 x daily - 2 sets - 10 reps Doorway Pec Stretch at 90 Degrees Abduction - 2 x daily - 3 reps - 30 hold

## 2020-11-25 ENCOUNTER — Encounter: Payer: Self-pay | Admitting: Physical Therapy

## 2020-11-25 NOTE — Therapy (Addendum)
[facility] [address] [place], [place], [address] Phone: [phone]   Fax:  [phone]  Physical Therapy Treatment  Patient Details  Name: Clinton Newton MRN: [id number] Date of Birth: 1951/02/09 Referring Provider (PT): [name]   Encounter Date: 11/21/2020   PT End of Session - 11/25/20 1637     Visit Number 3    Number of Visits 12    Date for PT Re-Evaluation 12/19/20    Authorization Type Medicare    PT Start Time 8412    PT Stop Time 1300    PT Time Calculation (min) 40 min    Activity Tolerance Patient tolerated treatment well    Behavior During Therapy [facility] for tasks assessed/performed             Past Medical History:  Diagnosis Date   Arthritis    L3-L4   Cataract    Colon polyps    Diverticulitis    GERD (gastroesophageal reflux disease)    Hyperlipidemia    Hypertension     Past Surgical History:  Procedure Laterality Date   APPENDECTOMY     COLONOSCOPY     POLYPECTOMY     PRE-MALIGNANT / BENIGN SKIN LESION EXCISION      There were no vitals filed for this visit.   Subjective Assessment - 11/25/20 1636     Subjective Pt still notes pain with certain movments    Currently in Pain? Yes    Pain Score 5     Pain Location Shoulder    Pain Orientation Right    Pain Descriptors / Indicators Sharp    Pain Type Acute pain    Pain Onset More than a month ago    Pain Frequency Intermittent                               [facility] Adult PT Treatment/Exercise - 11/25/20 0001       Exercises   Exercises Shoulder      Shoulder Exercises: Supine   External Rotation 15 reps    External Rotation Weight (lbs) 3    External Rotation Limitations 90/90    Other Supine Exercises D2 diagonals, maually resisted ROM x 15;      Shoulder Exercises: Standing   External Rotation 15 reps    Theraband Level (Shoulder External Rotation) Level 3 (Green)    Internal Rotation 15 reps     Theraband Level (Shoulder Internal Rotation) Level 3 (Green)    Flexion AROM;15 reps    ABduction 15 reps;AROM    Row 20 reps    Theraband Level (Shoulder Row) Level 3 (Green)      Shoulder Exercises: Pulleys   Flexion 3 minutes      Shoulder Exercises: Stretch   Internal Rotation Stretch 5 reps    Internal Rotation Stretch Limitations 2 hands behind back 5 sec x 5;  Towel x 10;      Manual Therapy   Manual Therapy Joint mobilization;Soft tissue mobilization;Passive ROM    Joint Mobilization Post and Inf mobs gr 3;                    PT Education - 11/25/20 1636     Education Details Reviewed HEP    Person(s) Educated Patient    Methods Explanation;Demonstration;Tactile cues;Verbal cues;Handout    Comprehension Verbalized understanding;Returned demonstration;Verbal cues required;Tactile cues required;Need further instruction  PT Short Term Goals - 11/09/20 1521       PT SHORT TERM GOAL #1   Title Pt to be independent wtih initial HEP    Time 2    Period Weeks    Status New    Target Date 11/21/20      PT SHORT TERM GOAL #2   Title Pt to demo improved IR by at least 10 deg .    Time 2    Period Weeks    Status New    Target Date 11/21/20               PT Long Term Goals - 11/09/20 1523       PT LONG TERM GOAL #1   Title Pt to be indepedent with final HEP    Time 6    Period Weeks    Status New    Target Date 12/19/20      PT LONG TERM GOAL #2   Title Pt to demo ROM of R shoulder to be WNL for elevation, rotation, and IR behind the back, without pain, to improve ability for reaching and IADLs.    Time 6    Period Weeks    Status New    Target Date 12/19/20      PT LONG TERM GOAL #3   Title Pt to report decreased pain in R shoulder to 0-2/10 with activity    Time 6    Period Weeks    Status New    Target Date 12/19/20      PT LONG TERM GOAL #4   Title Pt to demo R shoulder strength to be at least 4+/5 to improve  ability for lifting, carrying and IADLS.    Time 6    Period Weeks    Status New    Target Date 12/19/20                   Plan - 11/25/20 1638     Clinical Impression Statement Pt improving with ability for ROM and strengthening without pain. Does have increased pain mostly with reaching behind and out to side. Plan to work towards d/c. Rotational Strengthening added to HEP today.    Examination-Activity Limitations Lift;Dressing;Carry;Reach Overhead    Examination-Participation Restrictions Cleaning;Community Activity;Driving;Shop;Yard Work    Stability/Clinical Decision Making Stable/Uncomplicated    Rehab Potential Good    PT Frequency 2x / week    PT Duration 6 weeks    PT Treatment/Interventions ADLs/Self Care Home Management;Cryotherapy;Electrical Stimulation;Iontophoresis 63m/ml Dexamethasone;Moist Heat;Traction;Ultrasound;DME Instruction;Therapeutic exercise;Functional mobility training;Neuromuscular re-education;Patient/family education;Passive range of motion;Dry needling;Taping;Vasopneumatic Device;Spinal Manipulations;Joint Manipulations    Consulted and Agree with Plan of Care Patient             Patient will benefit from skilled therapeutic intervention in order to improve the following deficits and impairments:  Decreased range of motion,Decreased activity tolerance,Pain,Hypomobility,Decreased mobility,Decreased strength  Visit Diagnosis: Acute pain of right shoulder     Problem List Patient Active Problem List   Diagnosis Date Noted   [place] 10/12/2019   Lumbar facet arthropathy 11/20/2016   Lumbar degenerative disc disease 11/20/2016   Chronic right-sided low back pain without sciatica 11/20/2016   Reflex sympathetic dystrophy 10/29/2016   Knee pain - sees ortho 03/14/2014   Hyperlipemia 08/31/2007   Essential hypertension 08/31/2007    LLyndee Hensen PT, DPT 4:41 PM  11/25/20    [email or web address]  [address] Phone: [phone]   Fax:  [phone]  Name: Clinton Newton MRN: [id number] Date of Birth: Apr 13, 1951    PHYSICAL THERAPY DISCHARGE SUMMARY  Visits from Start of Care: 3  Plan: Patient agrees to discharge.  Patient goals were partially met. Patient is being discharged due to- not returning since last visit, was doing well at last visit.    Lyndee Hensen, PT, DPT 9:01 AM  01/19/22

## 2020-11-26 ENCOUNTER — Encounter: Payer: Medicare Other | Admitting: Physical Therapy

## 2020-12-01 DIAGNOSIS — Z23 Encounter for immunization: Secondary | ICD-10-CM | POA: Diagnosis not present

## 2020-12-02 ENCOUNTER — Encounter: Payer: Self-pay | Admitting: Family Medicine

## 2020-12-02 ENCOUNTER — Other Ambulatory Visit: Payer: Self-pay

## 2020-12-02 DIAGNOSIS — I10 Essential (primary) hypertension: Secondary | ICD-10-CM

## 2020-12-02 MED ORDER — BISOPROLOL FUMARATE 5 MG PO TABS: 2.5000 mg | ORAL_TABLET | Freq: Every day | ORAL | 3 refills | Status: DC

## 2020-12-03 ENCOUNTER — Encounter: Payer: Medicare Other | Admitting: Physical Therapy

## 2020-12-09 ENCOUNTER — Other Ambulatory Visit: Payer: Self-pay | Admitting: Family Medicine

## 2021-01-06 DIAGNOSIS — D3132 Benign neoplasm of left choroid: Secondary | ICD-10-CM | POA: Diagnosis not present

## 2021-01-06 DIAGNOSIS — H524 Presbyopia: Secondary | ICD-10-CM | POA: Diagnosis not present

## 2021-01-06 DIAGNOSIS — H2513 Age-related nuclear cataract, bilateral: Secondary | ICD-10-CM | POA: Diagnosis not present

## 2021-01-22 ENCOUNTER — Ambulatory Visit: Payer: Medicare Other | Admitting: Family Medicine

## 2021-01-23 ENCOUNTER — Encounter: Payer: Self-pay | Admitting: Family Medicine

## 2021-01-27 ENCOUNTER — Encounter: Payer: Self-pay | Admitting: Family Medicine

## 2021-01-27 ENCOUNTER — Ambulatory Visit (INDEPENDENT_AMBULATORY_CARE_PROVIDER_SITE_OTHER): Payer: Medicare Other | Admitting: Family Medicine

## 2021-01-27 ENCOUNTER — Other Ambulatory Visit: Payer: Self-pay

## 2021-01-27 VITALS — BP 128/78 | HR 72 | Temp 97.0°F | Ht 67.75 in | Wt 187.2 lb

## 2021-01-27 DIAGNOSIS — I798 Other disorders of arteries, arterioles and capillaries in diseases classified elsewhere: Secondary | ICD-10-CM

## 2021-01-27 DIAGNOSIS — I77810 Thoracic aortic ectasia: Secondary | ICD-10-CM

## 2021-01-27 DIAGNOSIS — Z125 Encounter for screening for malignant neoplasm of prostate: Secondary | ICD-10-CM

## 2021-01-27 DIAGNOSIS — Z1152 Encounter for screening for COVID-19: Secondary | ICD-10-CM | POA: Diagnosis not present

## 2021-01-27 DIAGNOSIS — I10 Essential (primary) hypertension: Secondary | ICD-10-CM | POA: Diagnosis not present

## 2021-01-27 LAB — COMPREHENSIVE METABOLIC PANEL
ALT: 34 U/L (ref 0–53)
AST: 25 U/L (ref 0–37)
Albumin: 4.6 g/dL (ref 3.5–5.2)
Alkaline Phosphatase: 58 U/L (ref 39–117)
BUN: 13 mg/dL (ref 6–23)
CO2: 28 mEq/L (ref 19–32)
Calcium: 9.4 mg/dL (ref 8.4–10.5)
Chloride: 106 mEq/L (ref 96–112)
Creatinine, Ser: 0.64 mg/dL (ref 0.40–1.50)
GFR: 97.42 mL/min (ref >60.00)
Glucose, Bld: 104 mg/dL — ABNORMAL HIGH (ref 70–99)
Potassium: 4 mEq/L (ref 3.5–5.1)
Sodium: 142 mEq/L (ref 135–145)
Total Bilirubin: 0.5 mg/dL (ref 0.2–1.2)
Total Protein: 6.9 g/dL (ref 6.0–8.3)

## 2021-01-27 LAB — CBC WITH DIFFERENTIAL/PLATELET
Basophils Absolute: 0 10*3/uL (ref 0.0–0.1)
Basophils Relative: 0.3 % (ref 0.0–3.0)
Eosinophils Absolute: 0.2 10*3/uL (ref 0.0–0.7)
Eosinophils Relative: 3.6 % (ref 0.0–5.0)
HCT: 45.1 % (ref 39.0–52.0)
Hemoglobin: 15.7 g/dL (ref 13.0–17.0)
Lymphocytes Relative: 30.3 % (ref 12.0–46.0)
Lymphs Abs: 1.6 10*3/uL (ref 0.7–4.0)
MCHC: 34.9 g/dL (ref 30.0–36.0)
MCV: 92.6 fl (ref 78.0–100.0)
Monocytes Absolute: 0.7 10*3/uL (ref 0.1–1.0)
Monocytes Relative: 12.6 % — ABNORMAL HIGH (ref 3.0–12.0)
Neutro Abs: 2.8 10*3/uL (ref 1.4–7.7)
Neutrophils Relative %: 53.2 % (ref 43.0–77.0)
Platelets: 211 10*3/uL (ref 150.0–400.0)
RBC: 4.87 Mil/uL (ref 4.22–5.81)
RDW: 13.1 % (ref 11.5–15.5)
WBC: 5.3 10*3/uL (ref 4.0–10.5)

## 2021-01-27 LAB — PSA, MEDICARE: PSA: 1.85 ng/ml (ref 0.10–4.00)

## 2021-01-27 MED ORDER — ZOLPIDEM TARTRATE 5 MG PO TABS
5.0000 mg | ORAL_TABLET | Freq: Every evening | ORAL | 0 refills | Status: DC | PRN
Start: 2021-01-27 — End: 2021-07-07

## 2021-01-27 NOTE — Patient Instructions (Signed)
-  routine labs today -when you come back in 6 months you need to be fasting for cholesterol.   -will get echo ordered for heart, they will call you with this.   -you are doing great! Keep up the good work.  Dr. Rogers Blocker

## 2021-01-27 NOTE — Progress Notes (Signed)
Patient: Clinton Newton MRN: [id number] DOB: October 18, 1951 PCP: Orma Flaming, MD     Subjective:  Chief Complaint  Patient presents with  . Hypertension  . Lab work    He is requesting blood work for [employer], and PSA.    HPI: The patient is a 70 y.o. male who presents today for HTN. He is requested blood work for Covid Antibodies, and PSA.   Hypertension: Here for follow up of hypertension.  Currently on norvasc 10mg /day, bisoprolol 2.5mg  daily.  Takes medication as prescribed and denies any side effects. Exercise includes walking. Weight has been stable. Denies any chest pain, headaches, shortness of breath, vision changes, swelling in lower extremities.   Would like his PSA level checked as well. Last checked 2 years ago and wnl. About 2 months ago he started to have some symptoms of straining to urinate and weak stream. He started natural supplements and this has resolved. No nocturia.   He would also like his covid antibodies checked. Had his 4th covid shot in 11/2020.   Review of Systems  Constitutional: Negative for chills, fatigue and fever.  HENT: Negative for congestion, dental problem, ear pain, hearing loss and trouble swallowing.   Eyes: Negative for visual disturbance.  Respiratory: Negative for cough, chest tightness and shortness of breath.   Cardiovascular: Negative for chest pain, palpitations and leg swelling.  Gastrointestinal: Negative for abdominal pain, blood in stool, diarrhea and nausea.  Endocrine: Negative for cold intolerance, polydipsia, polyphagia and polyuria.  Genitourinary: Negative for dysuria and hematuria.  Musculoskeletal: Negative for arthralgias.  Skin: Negative for rash.  Neurological: Negative for dizziness and headaches.  Psychiatric/Behavioral: Negative for dysphoric mood and sleep disturbance. The patient is not nervous/anxious.     Allergies Patient has No Known Allergies.  Past Medical History Patient  has a past medical  history of Arthritis, Cataract, Colon polyps, Diverticulitis, GERD (gastroesophageal reflux disease), Hyperlipidemia, and Hypertension.  Surgical History Patient  has a past surgical history that includes Appendectomy; Colonoscopy; Polypectomy; and Pre-malignant / benign skin lesion excision.  Family History Pateint's family history includes Heart disease in his father.  Social History Patient  reports that he has never smoked. He has never used smokeless tobacco. He reports current alcohol use of about 7.0 standard drinks of alcohol per week. He reports that he does not use drugs.    Objective: Vitals:   01/27/21 1351 01/27/21 1438  BP: (!) 148/88 128/78  Pulse: 72   Temp: (!) 97 F (36.1 C)   TempSrc: Temporal   SpO2: 97%   Weight: 187 lb 3.2 oz (84.9 kg)   Height: 5' 7.75" (1.721 m)     Body mass index is 28.67 kg/m.  Physical Exam Vitals reviewed.  Constitutional:      Appearance: Normal appearance. He is well-developed and normal weight.  HENT:     Head: Normocephalic and atraumatic.     Right Ear: Tympanic membrane, ear canal and external ear normal.     Left Ear: Tympanic membrane, ear canal and external ear normal.  Eyes:     Conjunctiva/sclera: Conjunctivae normal.     Pupils: Pupils are equal, round, and reactive to light.  Neck:     Thyroid: No thyromegaly.     Vascular: No carotid bruit.  Cardiovascular:     Rate and Rhythm: Normal rate and regular rhythm.     Pulses: Normal pulses.     Heart sounds: Normal heart sounds. No murmur heard.   Pulmonary:  Effort: Pulmonary effort is normal.     Breath sounds: Normal breath sounds.  Abdominal:     General: Bowel sounds are normal. There is no distension.     Palpations: Abdomen is soft.     Tenderness: There is no abdominal tenderness.  Musculoskeletal:     Cervical back: Normal range of motion and neck supple.  Lymphadenopathy:     Cervical: No cervical adenopathy.  Skin:    General: Skin is warm  and dry.     Capillary Refill: Capillary refill takes less than 2 seconds.     Findings: No rash.  Neurological:     General: No focal deficit present.     Mental Status: He is alert and oriented to person, place, and time.     Cranial Nerves: No cranial nerve deficit.     Coordination: Coordination normal.     Deep Tendon Reflexes: Reflexes normal.  Psychiatric:        Mood and Affect: Mood normal.        Behavior: Behavior normal.        Assessment/plan: 1. Essential hypertension Blood pressure is to goal. Continue current anti-hypertensive medications of norvasc 10mg /day and bisoprolol 2.5mg /day . Refills not given and routine lab work will be done today. Recommended routine exercise and healthy diet including DASH diet and mediterranean diet. Encouraged weight loss. F/u in 6 months.   - Microalbumin / creatinine urine ratio - CBC with Differential/Platelet - Comprehensive metabolic panel  2. Prostate cancer screening  - PSA, Medicare ( [place] only)  3. Ascending aorta dilation (HCC) -needs repeat echo. Due in 09/2020.   4. Encounter for screening for COVID-19  - SARS CoV2 Serology(COVID19) AB(IgG,IgM),Immunoassay    This visit occurred during the SARS-CoV-2 public health emergency.  Safety protocols were in place, including screening questions prior to the visit, additional usage of staff PPE, and extensive cleaning of exam room while observing appropriate contact time as indicated for disinfecting solutions.     Return in about 6 months (around 07/29/2021) for routine f/u with fasting labs. Orma Flaming, MD [place]   01/27/2021

## 2021-01-28 LAB — MICROALBUMIN / CREATININE URINE RATIO
Creatinine,U: 105.6 mg/dL
Microalb Creat Ratio: 1.7 mg/g (ref 0.0–30.0)
Microalb, Ur: 1.8 mg/dL (ref 0.0–1.9)

## 2021-01-28 LAB — SARS COV-2 SEROLOGY(COVID-19)AB(IGG,IGM),IMMUNOASSAY
SARS CoV-2 AB IgG: NEGATIVE
SARS CoV-2 IgM: NEGATIVE

## 2021-01-28 NOTE — Addendum Note (Signed)
Addended by: Jacob Moores on: 01/28/2021 02:01 PM   Modules accepted: Orders

## 2021-01-29 ENCOUNTER — Other Ambulatory Visit: Payer: Self-pay | Admitting: Family Medicine

## 2021-01-29 DIAGNOSIS — R739 Hyperglycemia, unspecified: Secondary | ICD-10-CM

## 2021-01-31 ENCOUNTER — Encounter: Payer: Self-pay | Admitting: Family Medicine

## 2021-02-10 ENCOUNTER — Other Ambulatory Visit: Payer: Self-pay

## 2021-02-10 MED ORDER — METHOCARBAMOL 750 MG PO TABS
750.0000 mg | ORAL_TABLET | Freq: Three times a day (TID) | ORAL | 0 refills | Status: DC
Start: 2021-02-10 — End: 2022-11-17

## 2021-02-18 NOTE — Progress Notes (Signed)
I, Peterson Lombard, LAT, ATC acting as a scribe for [name], MD.  Clinton Newton is a 70 y.o. male who presents to [place] at [facility] today for continued chronic R shoulder pain. Pt was last seen by Dr. Georgina Snell on 10/16/20 and was given a subacromial steroid injection and was referred to PT of which he completed 3 visits (last PT session was 11/21/20). Today, pt reports he has R-sided neck pain. Pt states that during his 2nd and 3rd PT sessions he was developing neck pain that was waking him up at night. Pt states that he R shoulder has cont to bother him. Pt reports that shoulder pain never fully resolved.  Radiates: yes- into R-side of neck UE Numbness/tingling: no UE Weakness: no Aggravates: IR, ext Treatments tried: IBU, Tylenol, Naproxen  Pt also c/o R elbow pain x 8 weeks. Pt locates pain to lateral aspect of elbow around the lateral epicondyl. Pt reports he like to do water aerobics and has been doing a lot of water dumbbells.  Radiates: no Grip strength: yes Numbness/tingling: no Aggravates: trying to grip things Treatments tried: naproxen, IBU  Pertinent review of systems: No fevers or chills  Relevant historical information: RSD history ankle   Exam:  BP 124/76   Pulse 74   Ht 5' 7.75" (1.721 m)   Wt 187 lb 3.2 oz (84.9 kg)   SpO2 96%   BMI 28.67 kg/m  General: Well Developed, well nourished, and in no acute distress.   MSK: Right shoulder normal-appearing Nontender. Range of motion intact abduction limited internal rotation normal external rotation. Pain with abduction. Intact strength. Positive Hawkins and Neer's test.  Positive empty can test. Negative Yergason's and speeds test.  Right elbow normal-appearing Tender palpation lateral epicondyle. Normal elbow motion and strength. Pain with wrist and finger extension.    Lab and Radiology Results  X-ray right shoulder images obtained today personally and independently  interpreted No acute fractures.  Minimal degenerative changes. Await formal radiology review    Assessment and Plan: 70 y.o. male with persistent right shoulder pain failing conservative management with physical therapy and home exercise program and subacromial injection.  Plan for MRI to further characterize cause of pain and determine next steps including possibly surgery.  Additionally patient has right elbow pain thought to be lateral epicondylitis.  Plan for home exercise program as taught in clinic by myself and ATC.   PDMP not reviewed this encounter. Orders Placed This Encounter  Procedures  . [place] LIMITED JOINT SPACE STRUCTURES UP RIGHT(NO LINKED CHARGES)    Standing Status:   Future    Number of Occurrences:   1    Standing Expiration Date:   08/21/2021    Order Specific Question:   Reason for Exam (SYMPTOM  OR DIAGNOSIS REQUIRED)    Answer:   right shoulder pain    Order Specific Question:   Preferred imaging location?    Answer:   [place] [facility]  . DG Shoulder Right    Standing Status:   Future    Number of Occurrences:   1    Standing Expiration Date:   02/19/2022    Order Specific Question:   Reason for Exam (SYMPTOM  OR DIAGNOSIS REQUIRED)    Answer:   eval shoulder pain    Order Specific Question:   Preferred imaging location?    Answer:   [facility]  . MR SHOULDER RIGHT WO CONTRAST    Standing Status:  Future    Standing Expiration Date:   02/19/2022    Order Specific Question:   What is the patient's sedation requirement?    Answer:   No Sedation    Order Specific Question:   Does the patient have a pacemaker or implanted devices?    Answer:   No    Order Specific Question:   Preferred imaging location?    Answer:   [facility] (table limit-550lbs)   No orders of the defined types were placed in this encounter.    Discussed warning signs or symptoms. Please see discharge instructions. Patient expresses  understanding.   The above documentation has been reviewed and is accurate and complete [name], M.D.

## 2021-02-19 ENCOUNTER — Ambulatory Visit (INDEPENDENT_AMBULATORY_CARE_PROVIDER_SITE_OTHER): Payer: Medicare Other

## 2021-02-19 ENCOUNTER — Ambulatory Visit (INDEPENDENT_AMBULATORY_CARE_PROVIDER_SITE_OTHER): Payer: Medicare Other | Admitting: Family Medicine

## 2021-02-19 ENCOUNTER — Ambulatory Visit: Payer: Self-pay

## 2021-02-19 ENCOUNTER — Other Ambulatory Visit: Payer: Self-pay

## 2021-02-19 VITALS — BP 124/76 | HR 74 | Ht 67.75 in | Wt 187.2 lb

## 2021-02-19 DIAGNOSIS — M25511 Pain in right shoulder: Secondary | ICD-10-CM | POA: Diagnosis not present

## 2021-02-19 DIAGNOSIS — M7711 Lateral epicondylitis, right elbow: Secondary | ICD-10-CM | POA: Insufficient documentation

## 2021-02-19 DIAGNOSIS — G8929 Other chronic pain: Secondary | ICD-10-CM | POA: Diagnosis not present

## 2021-02-19 NOTE — Patient Instructions (Addendum)
Thank you for coming in today.  Please get an Xray today before you leave  You should hear from MRI scheduling within 1 week. If you do not hear please let me know.   Do the home exercises for the elbow. View at [email or web address] using code: [facility]  Theraband flexbar may help.   Tennis Elbow  Tennis elbow is irritation and swelling (inflammation) in your outer forearm, near your elbow. Swelling affects the tissues that connect muscle to bone (tendons). Tennis elbow can happen playing any sport or doing any job where you use your elbow too much. It is caused by doing the same motion over and over. What are the causes? This condition is often caused by playing sports or doing work where you need to keep moving your forearm the same way. Sometimes, it may be caused by a sudden injury. What increases the risk? You are more likely to get tennis elbow if you play tennis or another racket sport. You also have a higher risk if you often use your hands for work. This includes:  People who use computers.  Architect workers.  People who work in a factory.  Musicians.  Cooks.  Cashiers. What are the signs or symptoms?  Pain and tenderness in your forearm and the outer part of your elbow. You may have pain all the time or only when you use your arm.  A burning feeling. This starts in your elbow and spreads down your arm.  A weak grip in your hand. How is this treated? Resting and icing your arm is often the first treatment. Your doctor may also recommend:  Medicines to reduce pain and swelling.  An elbow strap.  Physical therapy. This may include massage or exercises or both.  An elbow brace. If these do not help your symptoms get better, your doctor may recommend surgery. Follow these instructions at home: If you have a brace or strap:  Wear the brace or strap as told by your doctor. Take it off only as told by your doctor.  Check the skin around the brace or strap  every day. Tell your doctor if you see problems.  Loosen it if your fingers: ? Tingle. ? Become numb. ? Turn cold and blue.  Keep the brace or strap clean.  If the brace or strap is not waterproof: ? Do not let it get wet. ? Cover it with a watertight covering when you take a bath or a shower. Managing pain, stiffness, and swelling  If told, put ice on the injured area. To do this: ? If you have a removable brace or strap, take it off as told by your doctor. ? Put ice in a plastic bag. ? Place a towel between your skin and the bag. ? Leave the ice on for 20 minutes, 2-3 times a day. ? Take off the ice if your skin turns bright red. This is very important. If you cannot feel pain, heat, or cold, you have a greater risk of damage to the area.  Move your fingers often.   Activity  Rest your elbow and wrist. Avoid activities that can cause elbow problems as told by your doctor.  Do exercises as told by your doctor.  If you lift an object, lift it with your palm facing up. Lifestyle  If your tennis elbow is caused by sports, check your equipment and make sure that: ? You are using it the right way. ? It fits you well.  If your tennis  elbow is caused by work or computer use, take breaks often to stretch your arm. Talk with your manager about how you can make your condition better at work. General instructions  Take over-the-counter and prescription medicines only as told by your doctor.  Do not smoke or use any products that contain nicotine or tobacco. If you need help quitting, ask your doctor.  Keep all follow-up visits. How is this prevented?  Before and after being active: ? Warm up and stretch before being active. ? Cool down and stretch after being active. ? Give your body time to rest between activities.  While being active: ? Make sure to use equipment that fits you. ? If you play tennis, put power in your stroke with your lower body. Avoid using your arm  only.  Maintain physical fitness. This includes: ? Strength. ? Flexibility. ? Endurance.  Do exercises to strengthen the forearm muscles. Contact a doctor if:  Your pain does not get better with treatment.  Your pain gets worse.  You have weakness in your forearm, hand, or fingers.  You cannot feel your forearm, hand, or fingers. Get help right away if:  Your pain is very bad.  You cannot move your wrist. Summary  Tennis elbow is irritation and swelling (inflammation) in your outer forearm, near your elbow.  Tennis elbow is caused by doing the same motion over and over.  Rest your elbow and wrist. Avoid activities as told by your doctor.  If told, put ice on the injured area for 20 minutes, 2-3 times a day. This information is not intended to replace advice given to you by your health care provider. Make sure you discuss any questions you have with your health care provider. Document Revised: 04/23/2020 Document Reviewed: 04/23/2020 Elsevier Patient Education  [place].

## 2021-02-21 NOTE — Progress Notes (Signed)
Right shoulder x-ray looks normal to radiology

## 2021-02-25 ENCOUNTER — Other Ambulatory Visit: Payer: Self-pay

## 2021-02-25 ENCOUNTER — Ambulatory Visit (HOSPITAL_COMMUNITY): Payer: Medicare Other | Attending: Cardiovascular Disease

## 2021-02-25 DIAGNOSIS — I798 Other disorders of arteries, arterioles and capillaries in diseases classified elsewhere: Secondary | ICD-10-CM | POA: Insufficient documentation

## 2021-02-25 DIAGNOSIS — I77810 Thoracic aortic ectasia: Secondary | ICD-10-CM | POA: Insufficient documentation

## 2021-02-25 LAB — ECHOCARDIOGRAM LIMITED
Area-P 1/2: 2.87 cm2
S' Lateral: 3.1 cm

## 2021-03-03 IMAGING — MR MR KNEE*R* W/O CM
4 of 6 series · 19 of 40 positions shown · non-contrast
Comparison: Right knee x-rays dated July 22, 2020.

CLINICAL DATA: Right knee pain for the past 4 months. No injury or
prior surgery.

EXAM:
MRI OF THE RIGHT KNEE WITHOUT CONTRAST
TECHNIQUE: Multiplanar, multisequence MR imaging of the knee was performed. No
intravenous contrast was administered.

[Series 3: T2 fat-sat · axial · 4.0mm · 0.31mm/px · z∈[-65,+10]mm · 3 of 27 slices shown (1 of 2)]
[im 5/27]
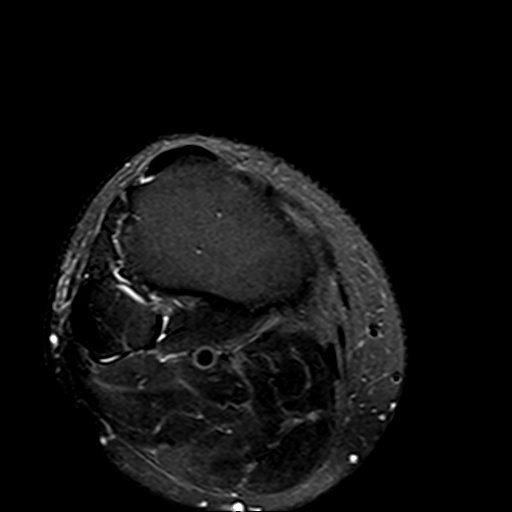
[im 14/27]
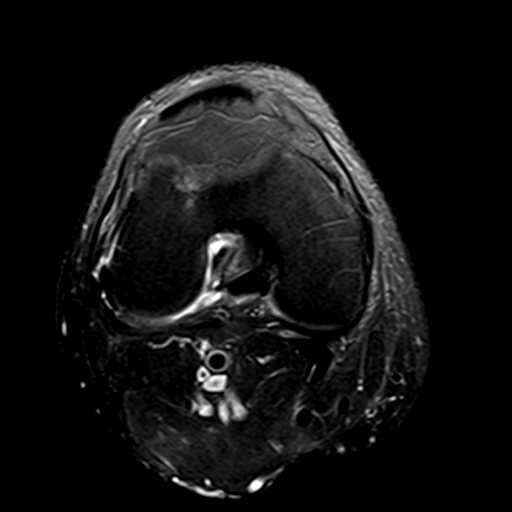
[im 22/27]
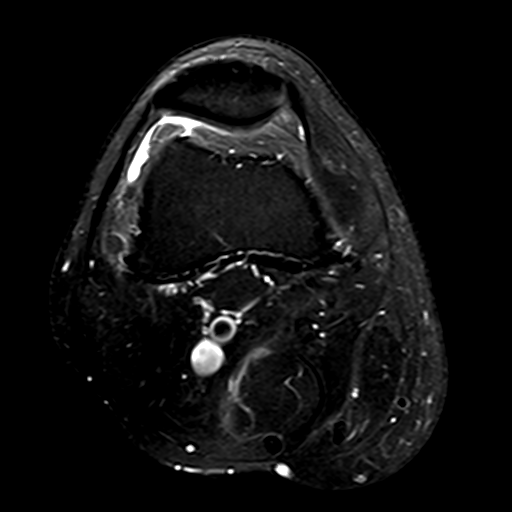

[Series 5: T2 fat-sat · coronal · 4.0mm · 0.29mm/px · 3 of 25 slices shown (2 of 2)]
[im 5/25]
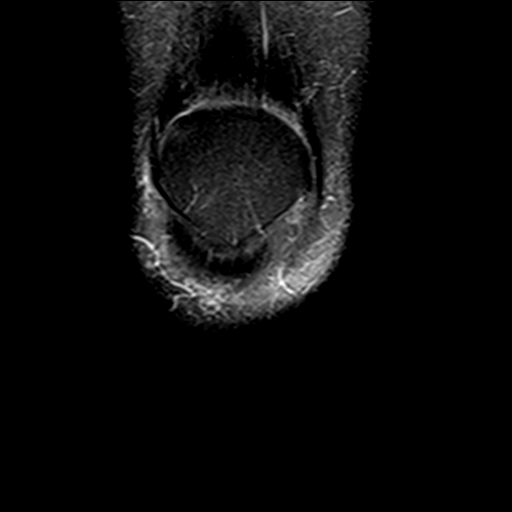
[im 15/25]
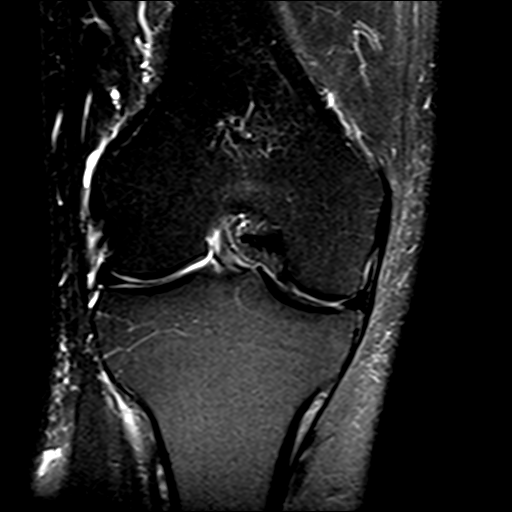
[im 25/25]
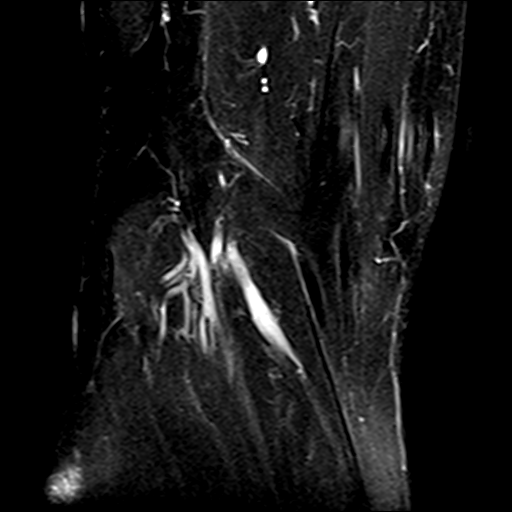

[Series 6: PD fat-sat · coronal · 3.0mm · 0.29mm/px · 7 of 28 slices shown (1 of 2)]
[im 1/28]
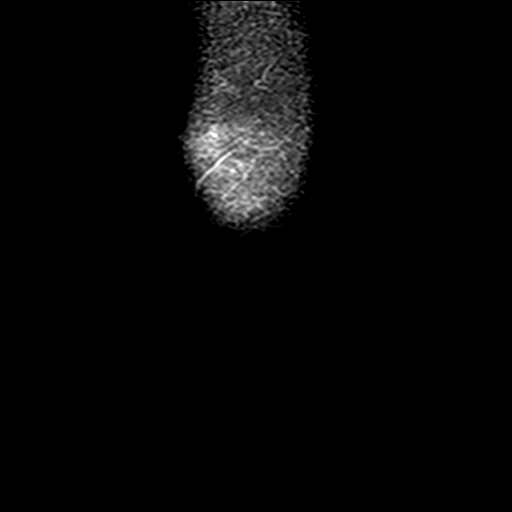
[im 5/28]
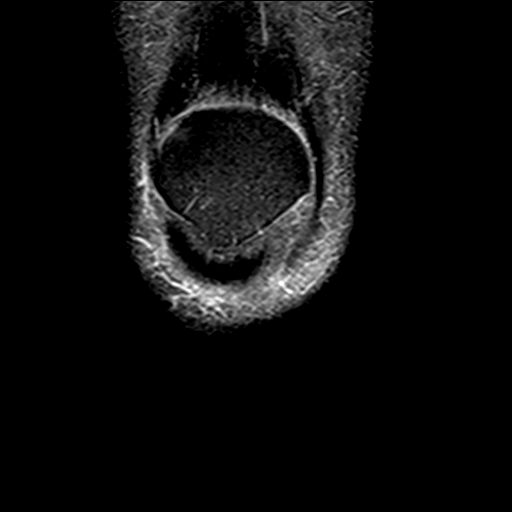
[im 10/28]
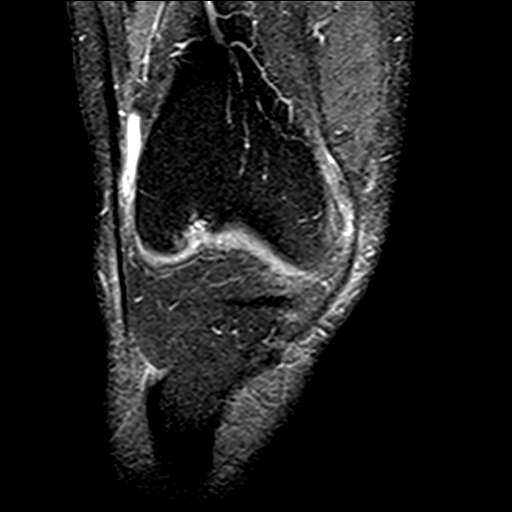
[im 14/28]
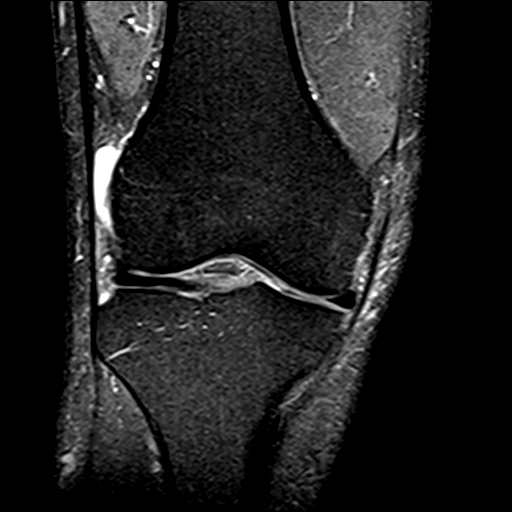
[im 19/28]
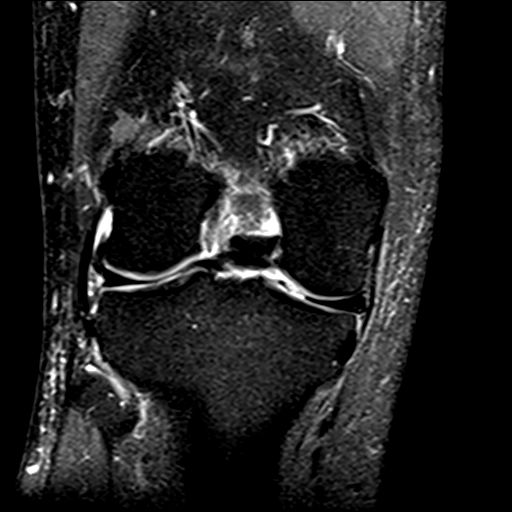
[im 23/28]
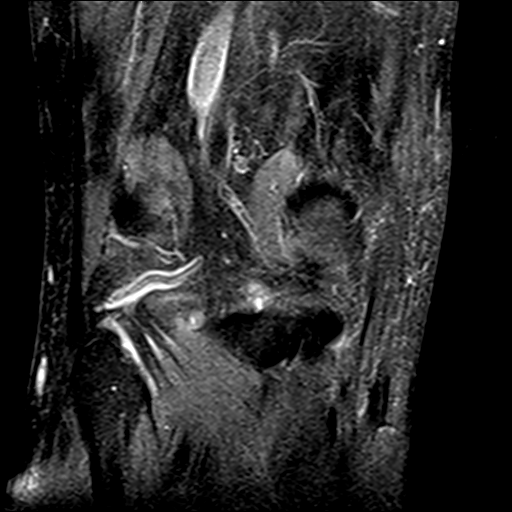
[im 28/28]
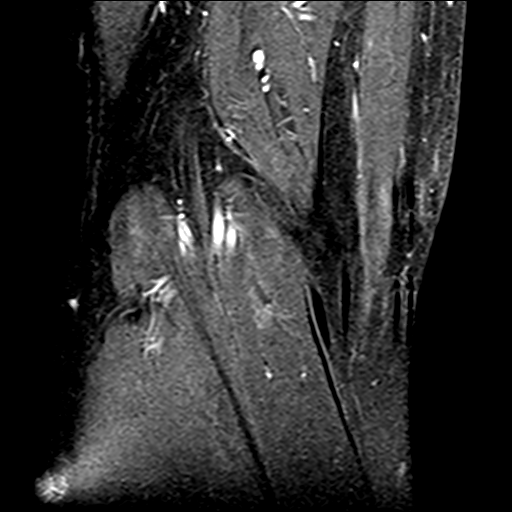

[Series 7: PD fat-sat · sagittal · 3.0mm · 0.29mm/px · 6 of 30 slices shown (2 of 2)]
[im 1/30]
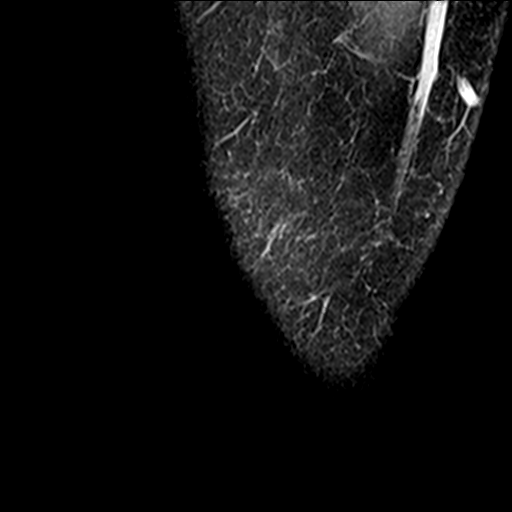
[im 5/30]
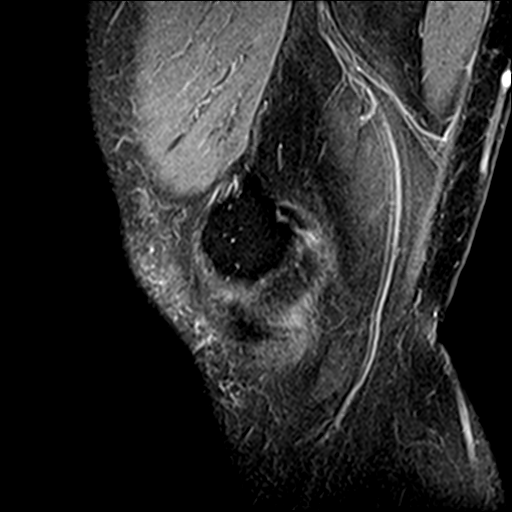
[im 10/30]
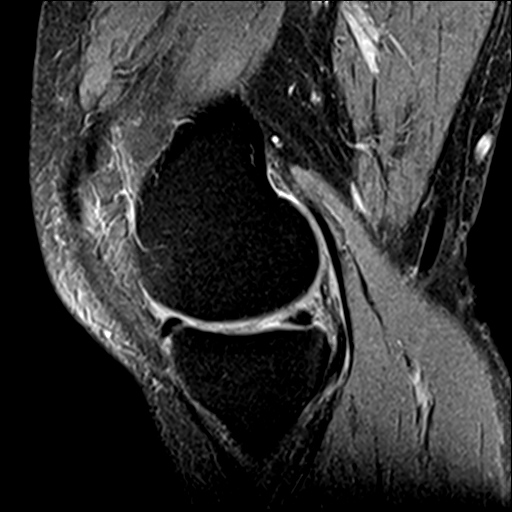
[im 15/30]
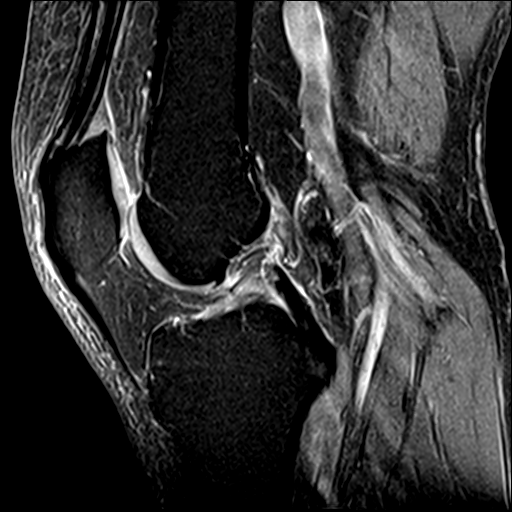
[im 20/30]
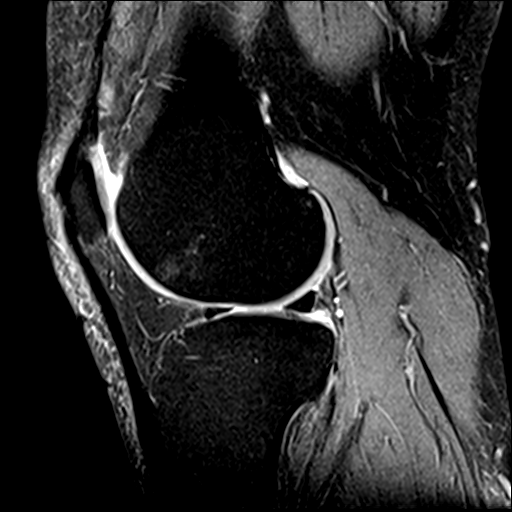
[im 25/30]
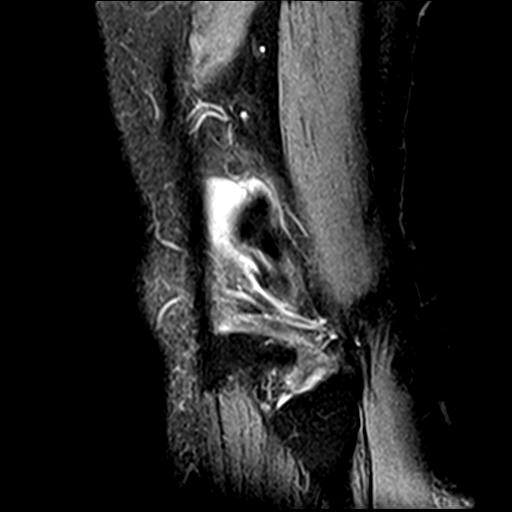

[19 of 40 positions shown; findings below may reference images not displayed]

FINDINGS: MENISCI

Medial meniscus: Free edge fraying of posterior horn. Otherwise
intact.

Lateral meniscus:  Intact.

LIGAMENTS

Cruciates:  Intact ACL and PCL.

Collaterals: Medial collateral ligament is intact. Lateral
collateral ligament complex is intact.

CARTILAGE

Patellofemoral: 1.1 cm full-thickness cartilage defect over the
lateral trochlea with underlying subchondral marrow edema.
Superficial fissuring and irregularity over the lateral patellar
facet.

Medial:  Mild partial-thickness cartilage loss.

Lateral:  No chondral defect.

Joint:  No joint effusion. Normal Hoffa's fat. No plical thickening.

Popliteal Fossa:  No Baker cyst. Intact popliteus tendon.

Extensor Mechanism: Intact quadriceps tendon and patellar tendon.
Intact medial and lateral patellar retinaculum. Intact MPFL.

Bones: No acute fracture or dislocation. No suspicious bone lesion.

Other: None.
IMPRESSION: 1. Free edge fraying of the medial meniscus posterior horn.
2. Mild medial and patellofemoral compartment osteoarthritis. 1.1 cm
full-thickness cartilage defect over the lateral trochlea.

## 2021-03-10 ENCOUNTER — Ambulatory Visit
Admission: RE | Admit: 2021-03-10 | Discharge: 2021-03-10 | Disposition: A | Discharge status: Home / Self Care | Payer: Medicare Other | Source: Ambulatory Visit | Attending: Family Medicine | Admitting: Family Medicine

## 2021-03-10 ENCOUNTER — Other Ambulatory Visit: Payer: Self-pay

## 2021-03-10 DIAGNOSIS — M7551 Bursitis of right shoulder: Secondary | ICD-10-CM | POA: Diagnosis not present

## 2021-03-10 DIAGNOSIS — M19011 Primary osteoarthritis, right shoulder: Secondary | ICD-10-CM | POA: Diagnosis not present

## 2021-03-10 DIAGNOSIS — M75101 Unspecified rotator cuff tear or rupture of right shoulder, not specified as traumatic: Secondary | ICD-10-CM | POA: Diagnosis not present

## 2021-03-10 DIAGNOSIS — M7521 Bicipital tendinitis, right shoulder: Secondary | ICD-10-CM | POA: Diagnosis not present

## 2021-03-10 DIAGNOSIS — M25511 Pain in right shoulder: Secondary | ICD-10-CM

## 2021-03-10 DIAGNOSIS — G8929 Other chronic pain: Secondary | ICD-10-CM

## 2021-03-11 NOTE — Progress Notes (Signed)
MRI shoulder shows supraspinatus rotator cuff tendinitis with a tiny rotator cuff tear and shoulder bursitis and biceps tendinitis.  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  There is also some shoulder arthritis and a possible labrum tear as well.  You have an appointment with Dr. Ninfa Linden tomorrow who will go over the results with you in more detail and discuss potential further treatment options.  One of the options will be an injection into the shoulder joint which he may be able to do and I could do as well.  He may offer you surgery as well.  Check back with me as needed.

## 2021-03-12 ENCOUNTER — Ambulatory Visit (INDEPENDENT_AMBULATORY_CARE_PROVIDER_SITE_OTHER): Payer: Medicare Other | Admitting: Orthopaedic Surgery

## 2021-03-12 ENCOUNTER — Encounter: Payer: Self-pay | Admitting: Orthopaedic Surgery

## 2021-03-12 DIAGNOSIS — M25511 Pain in right shoulder: Secondary | ICD-10-CM | POA: Diagnosis not present

## 2021-03-12 DIAGNOSIS — G8929 Other chronic pain: Secondary | ICD-10-CM

## 2021-03-12 DIAGNOSIS — M25561 Pain in right knee: Secondary | ICD-10-CM | POA: Diagnosis not present

## 2021-03-12 MED ORDER — METHYLPREDNISOLONE ACETATE 40 MG/ML IJ SUSP
40.0000 mg | INTRAMUSCULAR | Status: AC | PRN
  Administered 2021-03-12: 40 mg via INTRA_ARTICULAR

## 2021-03-12 MED ORDER — LIDOCAINE HCL 1 % IJ SOLN
3.0000 mL | INTRAMUSCULAR | Status: AC | PRN
  Administered 2021-03-12: 3 mL

## 2021-03-12 NOTE — Progress Notes (Signed)
Office Visit Note   Patient: Clinton Newton           Date of Birth: [birth year]           MRN: [id number] Visit Date: 03/12/2021              Requested by: Orma Flaming, [address] [place],  [place] [address] PCP: Orma Flaming, MD   Assessment & Plan: Visit Diagnoses:  1. Chronic right shoulder pain   2. Chronic pain of right knee     Plan: I talked about the MRI of his right shoulder.  I feel that is reasonable to try 1 more steroid injection in the right shoulder since his been 6 months since his last injection and he is not showing signs of arthrofibrosis at all.  His range of motion is full.  He agreed to this treatment plan and tolerated steroid injection well.  My next step would be considering an arthroscopic intervention with that right shoulder.  I showed him the MRI of his shoulder as well as a shoulder model.  As far as his right knee goes, there was no effusion aspirate in the knee and I did place a steroid injection in it easily.  1 consideration would be a patellofemoral arthroplasty if the knee starts hurting him bad enough.  He should still avoid hills and stairs is much as possible and work on Forensic scientist.  I would like to see him back in 6 weeks to see how he is doing from both of these injections.  All questions and concerns were answered and addressed.  Follow-Up Instructions: Return in about 6 weeks (around 04/23/2021).   Orders:  Orders Placed This Encounter  Procedures  . Large Joint Inj  . Large Joint Inj   No orders of the defined types were placed in this encounter.     Procedures: Large Joint Inj: R knee on 03/12/2021 3:32 PM Indications: diagnostic evaluation and pain Details: 22 G 1.5 in needle, superolateral approach  Arthrogram: No  Medications: 3 mL lidocaine 1 %; 40 mg methylPREDNISolone acetate 40 MG/ML Outcome: tolerated well, no immediate complications Procedure, treatment alternatives, risks and benefits explained,  specific risks discussed. Consent was given by the patient. Immediately prior to procedure a time out was called to verify the correct patient, procedure, equipment, support staff and site/side marked as required. Patient was prepped and draped in the usual sterile fashion.   Large Joint Inj: R subacromial bursa on 03/12/2021 3:32 PM Indications: pain and diagnostic evaluation Details: 22 G 1.5 in needle  Arthrogram: No  Medications: 3 mL lidocaine 1 %; 40 mg methylPREDNISolone acetate 40 MG/ML Outcome: tolerated well, no immediate complications Procedure, treatment alternatives, risks and benefits explained, specific risks discussed. Consent was given by the patient. Immediately prior to procedure a time out was called to verify the correct patient, procedure, equipment, support staff and site/side marked as required. Patient was prepped and draped in the usual sterile fashion.       Clinical Data: No additional findings.   Subjective: Chief Complaint  Patient presents with  . Right Knee - Pain  The patient comes in today for follow-up for his right knee but also for evaluation treatment of his right shoulder.  He has had MRI of the right shoulder.  I was able to review that today.  He hurts with overhead activities and reaching behind and mainly with the right shoulder.  He denies any injury.  He denies any  neck pain.  There is no radicular component of his pain with his shoulder.  The right knee is also been hurting with activities.  He is only 70 years old and we did MRI of the right knee and it showed significant arthritic changes at the trochlear groove but otherwise the medial lateral compartments were pristine and well-maintained and there was no meniscal tear.  A steroid injection has helped in the past and his right shoulder and his right knee.  He has had no other acute change in medical status.  He is not a diabetic.  HPI  Review of Systems He currently denies any headache,  chest pain, shortness of breath, fever, chills, nausea, vomiting  Objective: Vital Signs: There were no vitals taken for this visit.  Physical Exam He is alert and orient x3 and in no acute distress Ortho Exam Examination of his right shoulder shows 5 out of 5 strength of the rotator cuff and excellent range of motion.  He hurts some with external rotation and reaching behind him with the right shoulder.  He uses his rotator cuff appropriately to abduct and rotate his shoulder.  There is some mild signs of impingement.  His liftoff is negative.  Examination of his right knee shows no effusion with some global tenderness.  The right knee is ligamentously stable. Specialty Comments:  No specialty comments available.  Imaging: No results found. The MRI of his right shoulder is reviewed.  There is a small partial-thickness rotator cuff tear and some mild tendinosis and mild bursitis in the shoulder.  There is no muscle atrophy at all.  There is some degenerative superior labral tearing as well.  PMFS History: Patient Active Problem List   Diagnosis Date Noted  . Lateral epicondylitis, right elbow 02/19/2021  . Ascending aorta dilation (HCC) 10/12/2019  . Lumbar facet arthropathy 11/20/2016  . Lumbar degenerative disc disease 11/20/2016  . Chronic right-sided low back pain without sciatica 11/20/2016  . Reflex sympathetic dystrophy 10/29/2016  . Knee pain - sees ortho 03/14/2014  . Hyperlipemia 08/31/2007  . Essential hypertension 08/31/2007   Past Medical History:  Diagnosis Date  . Arthritis    L3-L4  . Cataract   . Colon polyps   . Diverticulitis   . GERD (gastroesophageal reflux disease)   . Hyperlipidemia   . Hypertension     Family History  Problem Relation Age of Onset  . Heart disease Father   . Colon cancer Neg Hx   . Pancreatic cancer Neg Hx   . Rectal cancer Neg Hx   . Stomach cancer Neg Hx   . Esophageal cancer Neg Hx     Past Surgical History:  Procedure  Laterality Date  . APPENDECTOMY    . COLONOSCOPY    . POLYPECTOMY    . PRE-MALIGNANT / BENIGN SKIN LESION EXCISION     Social History   Occupational History  . Occupation: Retired  Tobacco Use  . Smoking status: Never Smoker  . Smokeless tobacco: Never Used  Vaping Use  . Vaping Use: Never used  Substance and Sexual Activity  . Alcohol use: Yes    Alcohol/week: 7.0 standard drinks    Types: 7 Glasses of wine per week  . Drug use: No  . Sexual activity: Yes

## 2021-03-26 ENCOUNTER — Other Ambulatory Visit: Payer: Self-pay | Admitting: Family Medicine

## 2021-04-23 ENCOUNTER — Other Ambulatory Visit: Payer: Self-pay

## 2021-04-23 ENCOUNTER — Ambulatory Visit (INDEPENDENT_AMBULATORY_CARE_PROVIDER_SITE_OTHER): Payer: Medicare Other | Admitting: Orthopaedic Surgery

## 2021-04-23 ENCOUNTER — Encounter: Payer: Self-pay | Admitting: Orthopaedic Surgery

## 2021-04-23 DIAGNOSIS — G8929 Other chronic pain: Secondary | ICD-10-CM | POA: Diagnosis not present

## 2021-04-23 DIAGNOSIS — M25561 Pain in right knee: Secondary | ICD-10-CM

## 2021-04-23 DIAGNOSIS — M25511 Pain in right shoulder: Secondary | ICD-10-CM | POA: Diagnosis not present

## 2021-04-23 NOTE — Progress Notes (Signed)
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  He has had at least 2 injections in the shoulder with the last one being about 6 weeks ago.  It has been a year I believe since his first injection.  He says it did help some but he still having pain with reaching behind him and over activities with that right shoulder and limited motion.  He does feel that recent getting back into water aerobics is helping.  He is also been dealing with patellofemoral arthritis of the right knee.  He does have good range of motion of his right shoulder but definitely has positive Neer and Hawkins signs and signs of impingement of the shoulder.  His rotator cuff itself feels strong.  Previous MRI of the shoulder showed tendinosis of the rotator cuff and just partial tearing of the rotator cuff.  At this point he will continue his water aerobics an option under some exercises to try for his shoulder.  I would like to see him back in just 3 weeks and at that visit placed another steroid injection in the right shoulder subacromial outlet.  He agrees with this treatment plan.  All question concerns were answered and addressed.

## 2021-05-08 ENCOUNTER — Other Ambulatory Visit: Payer: Self-pay | Admitting: Family Medicine

## 2021-05-08 NOTE — Telephone Encounter (Signed)
Med was d/c by pt's PCP on 01/27/21 and Dr. Georgina Snell has not seen pt since April 2022.

## 2021-05-09 ENCOUNTER — Encounter: Payer: Self-pay | Admitting: Family Medicine

## 2021-05-09 ENCOUNTER — Ambulatory Visit (INDEPENDENT_AMBULATORY_CARE_PROVIDER_SITE_OTHER): Payer: Medicare Other | Admitting: Family Medicine

## 2021-05-09 ENCOUNTER — Other Ambulatory Visit: Payer: Self-pay

## 2021-05-09 VITALS — BP 122/77 | HR 72 | Temp 98.0°F | Ht 67.75 in | Wt 186.8 lb

## 2021-05-09 DIAGNOSIS — I77810 Thoracic aortic ectasia: Secondary | ICD-10-CM | POA: Diagnosis not present

## 2021-05-09 DIAGNOSIS — G8929 Other chronic pain: Secondary | ICD-10-CM | POA: Diagnosis not present

## 2021-05-09 DIAGNOSIS — G4700 Insomnia, unspecified: Secondary | ICD-10-CM

## 2021-05-09 DIAGNOSIS — I10 Essential (primary) hypertension: Secondary | ICD-10-CM | POA: Diagnosis not present

## 2021-05-09 DIAGNOSIS — E782 Mixed hyperlipidemia: Secondary | ICD-10-CM | POA: Diagnosis not present

## 2021-05-09 DIAGNOSIS — F419 Anxiety disorder, unspecified: Secondary | ICD-10-CM | POA: Insufficient documentation

## 2021-05-09 DIAGNOSIS — M545 Low back pain, unspecified: Secondary | ICD-10-CM | POA: Diagnosis not present

## 2021-05-09 DIAGNOSIS — N401 Enlarged prostate with lower urinary tract symptoms: Secondary | ICD-10-CM | POA: Insufficient documentation

## 2021-05-09 DIAGNOSIS — N400 Enlarged prostate without lower urinary tract symptoms: Secondary | ICD-10-CM | POA: Insufficient documentation

## 2021-05-09 DIAGNOSIS — N3943 Post-void dribbling: Secondary | ICD-10-CM | POA: Diagnosis not present

## 2021-05-09 NOTE — Assessment & Plan Note (Signed)
>>  ASSESSMENT AND PLAN FOR CHRONIC RIGHT-SIDED LOW BACK PAIN WITHOUT SCIATICA WRITTEN ON 05/09/2021 11:43 AM BY PARKER, CALEB M, MD  On Robaxin  375 mg 3 times daily as needed.  Overall his symptoms are stable though still has some issues with low back pain.  He will be following up with orthopedic soon.

## 2021-05-09 NOTE — Assessment & Plan Note (Signed)
Has followed with urology in the past but no longer sees them.  He takes Beta-sitosterol and saw palmetto supplements which control his symptoms.

## 2021-05-09 NOTE — Assessment & Plan Note (Signed)
Uses BuSpar as needed which works well.

## 2021-05-09 NOTE — Patient Instructions (Signed)
It was very nice to see you today!  I am glad you are doing well.  No changes today.  Please let me know if the nausea or vomiting returns.  We will see back in the spring for your annual checkup.  Please come back to see me sooner if needed.  Take care, Dr Jerline Pain  PLEASE NOTE:  If you had any lab tests please let us know if you have not heard back within a few days. You may see your results on mychart before we have a chance to review them but we will give you a call once they are reviewed by [place]. If we ordered any referrals today, please let us know if you have not heard from their office within the next week.   Please try these tips to maintain a healthy lifestyle:  Eat at least 3 REAL meals and 1-2 snacks per day.  Aim for no more than 5 hours between eating.  If you eat breakfast, please do so within one hour of getting up.   Each meal should contain half fruits/vegetables, one quarter protein, and one quarter carbs (no bigger than a computer mouse)  Cut down on sweet beverages. This includes juice, soda, and sweet tea.   Drink at least 1 glass of water with each meal and aim for at least 8 glasses per day  Exercise at least 150 minutes every week.

## 2021-05-09 NOTE — Progress Notes (Signed)
Clinton Newton is a 70 y.o. male who presents today for an office visit.  HE is transferring care to [place] as his PCP.   Assessment/Plan:  New/Acute Problems: Nausea Likely mild viral gastroenteritis versus food poisoning.  We will continue with watchful waiting.  He will let me know if symptoms return.  Chronic Problems Addressed Today: Insomnia Stable on Ambien as needed.  We will refill as needed.  BPH (benign prostatic hyperplasia) Has followed with urology in the past but no longer sees them.  He takes Beta-sitosterol and saw palmetto supplements which control his symptoms.  Anxiety Uses BuSpar as needed which works well.  Chronic right-sided low back pain without sciatica On Robaxin 375 mg 3 times daily as needed.  Overall his symptoms are stable though still has some issues with low back pain.  He will be following up with orthopedic soon.  Essential hypertension On amlodipine 10 mg daily.  Has a small amount of leg swelling which is manageable.  He is also on bisoprolol 2.5 mg daily and tolerating well.  Hyperlipemia On Crestor 20 mg daily.  Tolerating well.  We can check lipids when he comes back in for CPE next spring.  Ascending aorta dilation (HCC) Stable on last echocardiogram in May 2022     Subjective:  HPI:  See A/p for status of chronic conditions.  He had a GI illness a few weeks ago with some nausea and vomiting for couple of days.  This resolved but had recurrence of one-time episode of vomiting a few days ago.  No abdominal pain.  No fevers or chills.  Symptoms of been stable over the last couple of days with no recurrence.  PMH:  The following were reviewed and entered/updated in epic: Past Medical History:  Diagnosis Date   Arthritis    L3-L4   Cataract    Colon polyps    Diverticulitis    GERD (gastroesophageal reflux disease)    Hyperlipidemia    Hypertension    Patient Active Problem List   Diagnosis Date Noted   Anxiety 05/09/2021   BPH  (benign prostatic hyperplasia) 05/09/2021   Ascending aorta dilation ([facility]) 10/12/2019   Lumbar degenerative disc disease 11/20/2016   Chronic right-sided low back pain without sciatica 11/20/2016   Insomnia 03/14/2014   Hyperlipemia 08/31/2007   Essential hypertension 08/31/2007   Past Surgical History:  Procedure Laterality Date   APPENDECTOMY     COLONOSCOPY     POLYPECTOMY     PRE-MALIGNANT / BENIGN SKIN LESION EXCISION      Family History  Problem Relation Age of Onset   Heart disease Father    Colon cancer Neg Hx    Pancreatic cancer Neg Hx    Rectal cancer Neg Hx    Stomach cancer Neg Hx    Esophageal cancer Neg Hx     Medications- reviewed and updated Current Outpatient Medications  Medication Sig Dispense Refill   amLODipine (NORVASC) 10 MG tablet TAKE 1 TABLET BY MOUTH EVERY DAY 90 tablet 3   ascorbic acid (VITAMIN C) 1000 MG tablet Take 1,000 mg by mouth daily.     bisoprolol (ZEBETA) 5 MG tablet Take 0.5 tablets (2.5 mg total) by mouth daily. 45 tablet 3   busPIRone (BUSPAR) 7.5 MG tablet TAKE 1 TABLET (7.5 MG TOTAL) BY MOUTH 3 (THREE) TIMES DAILY. 270 tablet 1   cholecalciferol (VITAMIN D) 1000 UNITS tablet Take 1,000 Units by mouth daily.     Flaxseed, Linseed, (FLAXSEED OIL  PO) Take by mouth as directed.     methocarbamol (ROBAXIN-750) 750 MG tablet Take 1 tablet (750 mg total) by mouth 3 (three) times daily. 270 tablet 0   Misc Natural Products (BETA-SITOSTEROL PLANT STEROLS PO)      Multiple Vitamin (MULTIVITAMIN) tablet Take 1 tablet by mouth daily.     Omega-3 Fatty Acids (FISH OIL PO) Take by mouth daily.     Probiotic Product (PROBIOTIC PO) Take by mouth daily.     rosuvastatin (CRESTOR) 20 MG tablet TAKE 1 TABLET BY MOUTH EVERY DAY 90 tablet 3   Saw Palmetto 500 MG CAPS      vitamin B-12 (CYANOCOBALAMIN) 1000 MCG tablet Take 1,000 mcg by mouth daily.     zolpidem (AMBIEN) 5 MG tablet Take 1 tablet (5 mg total) by mouth at bedtime as needed. for sleep 20  tablet 0   No current facility-administered medications for this visit.    Allergies-reviewed and updated No Known Allergies  Social History   Socioeconomic History   Marital status: Married    Spouse name: Not on file   Number of children: 0   Years of education: Not on file   Highest education level: Not on file  Occupational History   Occupation: Retired  Tobacco Use   Smoking status: Never   Smokeless tobacco: Never  Vaping Use   Vaping Use: Never used  Substance and Sexual Activity   Alcohol use: Yes    Alcohol/week: 7.0 standard drinks    Types: 7 Glasses of wine per week   Drug use: No   Sexual activity: Yes  Other Topics Concern   Not on file  Social History Narrative   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, does art - glass, painting, metal, body painting      Home Situation: lives with wife      Lifestyle: working out on a regular basis; diet is healthy         Social Determinants of Radio broadcast assistant Strain: Not on Art therapist Insecurity: Not on file  Transportation Needs: Not on file  Physical Activity: Not on file  Stress: Not on file  Social Connections: Not on file          Objective:  Physical Exam: BP 122/77   Pulse 72   Temp 98 F (36.7 C) (Temporal)   Ht 5' 7.75" (1.721 m)   Wt 186 lb 12.8 oz (84.7 kg)   SpO2 97%   BMI 28.61 kg/m   Gen: No acute distress, resting comfortably CV: Regular rate and rhythm with no murmurs appreciated Pulm: Normal work of breathing, clear to auscultation bilaterally with no crackles, wheezes, or rhonchi Neuro: Grossly normal, moves all extremities Psych: Normal affect and thought content  Time Spent: 50 minutes of total time was spent on the date of the encounter performing the following actions: chart review prior to seeing the patient including recent visits with previous PCP, obtaining history, performing a medically necessary exam, counseling on the treatment  plan, placing orders, and documenting in our EHR.        Algis Greenhouse. Jerline Pain, MD 05/09/2021 11:44 AM

## 2021-05-09 NOTE — Assessment & Plan Note (Signed)
Stable on Ambien as needed.  We will refill as needed.

## 2021-05-09 NOTE — Assessment & Plan Note (Signed)
On Crestor 20 mg daily.  Tolerating well.  We can check lipids when he comes back in for CPE next spring.

## 2021-05-09 NOTE — Assessment & Plan Note (Signed)
On amlodipine 10 mg daily.  Has a small amount of leg swelling which is manageable.  He is also on bisoprolol 2.5 mg daily and tolerating well.

## 2021-05-09 NOTE — Assessment & Plan Note (Signed)
On Robaxin 375 mg 3 times daily as needed.  Overall his symptoms are stable though still has some issues with low back pain.  He will be following up with orthopedic soon.

## 2021-05-09 NOTE — Assessment & Plan Note (Signed)
Stable on last echocardiogram in May 2022

## 2021-05-14 ENCOUNTER — Encounter: Payer: Self-pay | Admitting: Orthopaedic Surgery

## 2021-05-14 ENCOUNTER — Ambulatory Visit (INDEPENDENT_AMBULATORY_CARE_PROVIDER_SITE_OTHER): Payer: Medicare Other | Admitting: Orthopaedic Surgery

## 2021-05-14 DIAGNOSIS — G8929 Other chronic pain: Secondary | ICD-10-CM

## 2021-05-14 DIAGNOSIS — M25511 Pain in right shoulder: Secondary | ICD-10-CM | POA: Diagnosis not present

## 2021-05-14 MED ORDER — METHYLPREDNISOLONE ACETATE 40 MG/ML IJ SUSP
40.0000 mg | INTRAMUSCULAR | Status: AC | PRN
  Administered 2021-05-14: 40 mg via INTRA_ARTICULAR

## 2021-05-14 MED ORDER — LIDOCAINE HCL 1 % IJ SOLN
3.0000 mL | INTRAMUSCULAR | Status: AC | PRN
  Administered 2021-05-14: 3 mL

## 2021-05-14 NOTE — Progress Notes (Signed)
Office Visit Note   Patient: Clinton Newton           Date of Birth: April 24, 1951           MRN: [id number] Visit Date: 05/14/2021              Requested by: Orma Flaming, MD [place],  [place] [address] PCP: Vivi Barrack, MD   Assessment & Plan: Visit Diagnoses:  1. Chronic right shoulder pain     Plan: We both agreed to try a steroid injection in his right shoulder subacromial outlet today.  He did tolerate the injection well.  He will continue to use his shoulder and I will see him back in 3 months to see how he is doing overall but no x-rays are needed.  Follow-Up Instructions: Return in about 3 months (around 08/14/2021).   Orders:  Orders Placed This Encounter  Procedures   Large Joint Inj   No orders of the defined types were placed in this encounter.     Procedures: Large Joint Inj: R subacromial bursa on 05/14/2021 1:04 PM Indications: pain and diagnostic evaluation Details: 22 G 1.5 in needle  Arthrogram: No  Medications: 3 mL lidocaine 1 %; 40 mg methylPREDNISolone acetate 40 MG/ML Outcome: tolerated well, no immediate complications Procedure, treatment alternatives, risks and benefits explained, specific risks discussed. Consent was given by the patient. Immediately prior to procedure a time out was called to verify the correct patient, procedure, equipment, support staff and site/side marked as required. Patient was prepped and draped in the usual sterile fashion.      Clinical Data: No additional findings.   Subjective: Chief Complaint  Patient presents with   Right Shoulder - Follow-up  The patient is well-known to me.  He has been dealing with right shoulder impingement syndrome.  He is 70 years old.  He did does get into the pool and treads water.  Different activities reaching behind him and overhead and far away from him does cause pain in the shoulder on the right side.  He denies any significant weakness.  Previous MRI does  show tendinosis of the rotator cuff was just slight partial tearing and decrease of the subacromial outlet.  It has been a while since we have injected the shoulder and I thought this may be a good idea to try this today as a see.  He has had no other acute change in medical status.  HPI  Review of Systems   Objective: Vital Signs: There were no vitals taken for this visit.  Physical Exam He is alert and orient x3 and in no acute distress Ortho Exam Examination of his right shoulder does show signs of impingement with good range of motion overall. Specialty Comments:  No specialty comments available.  Imaging: No results found.   PMFS History: Patient Active Problem List   Diagnosis Date Noted   Anxiety 05/09/2021   BPH (benign prostatic hyperplasia) 05/09/2021   Ascending aorta dilation ([facility]) 10/12/2019   Lumbar degenerative disc disease 11/20/2016   Chronic right-sided low back pain without sciatica 11/20/2016   Insomnia 03/14/2014   Hyperlipemia 08/31/2007   Essential hypertension 08/31/2007   Past Medical History:  Diagnosis Date   Arthritis    L3-L4   Cataract    Colon polyps    Diverticulitis    GERD (gastroesophageal reflux disease)    Hyperlipidemia    Hypertension     Family History  Problem Relation Age of Onset  Heart disease Father    Colon cancer Neg Hx    Pancreatic cancer Neg Hx    Rectal cancer Neg Hx    Stomach cancer Neg Hx    Esophageal cancer Neg Hx     Past Surgical History:  Procedure Laterality Date   APPENDECTOMY     COLONOSCOPY     POLYPECTOMY     PRE-MALIGNANT / BENIGN SKIN LESION EXCISION     Social History   Occupational History   Occupation: Retired  Tobacco Use   Smoking status: Never   Smokeless tobacco: Never  Vaping Use   Vaping Use: Never used  Substance and Sexual Activity   Alcohol use: Yes    Alcohol/week: 7.0 standard drinks    Types: 7 Glasses of wine per week   Drug use: No   Sexual activity: Yes

## 2021-05-23 ENCOUNTER — Encounter: Payer: Self-pay | Admitting: Family

## 2021-05-23 ENCOUNTER — Telehealth: Payer: Medicare Other | Admitting: Family

## 2021-05-23 ENCOUNTER — Encounter: Payer: Self-pay | Admitting: Family Medicine

## 2021-05-23 DIAGNOSIS — U071 COVID-19: Secondary | ICD-10-CM | POA: Diagnosis not present

## 2021-05-23 MED ORDER — MOLNUPIRAVIR EUA 200MG CAPSULE: 4.0000 | ORAL_CAPSULE | Freq: Two times a day (BID) | ORAL | 0 refills | Status: AC

## 2021-05-23 NOTE — Telephone Encounter (Signed)
Patient is called in wanting an appointment, advised that we had no open appointments to go through [facility] and request a virtual. Patient verbalized okay.

## 2021-05-23 NOTE — Progress Notes (Signed)
Virtual Visit Consent   Clinton Newton, you are scheduled for a virtual visit with a [facility] provider today.     Just as with appointments in the office, your consent must be obtained to participate.  Your consent will be active for this visit and any virtual visit you may have with one of our providers in the next 365 days.     If you have a MyChart account, a copy of this consent can be sent to you electronically.  All virtual visits are billed to your insurance company just like a traditional visit in the office.    As this is a virtual visit, video technology does not allow for your provider to perform a traditional examination.  This may limit your provider's ability to fully assess your condition.  If your provider identifies any concerns that need to be evaluated in person or the need to arrange testing (such as labs, EKG, etc.), we will make arrangements to do so.     Although advances in technology are sophisticated, we cannot ensure that it will always work on either your end or our end.  If the connection with a video visit is poor, the visit may have to be switched to a telephone visit.  With either a video or telephone visit, we are not always able to ensure that we have a secure connection.     I need to obtain your verbal consent now.   Are you willing to proceed with your visit today?    Clinton Newton has provided verbal consent on 05/23/2021 for a virtual visit (video or telephone).   Evelina Dun, FNP   Date: 05/23/2021 12:01 PM   Virtual Visit via Video Note   I, Evelina Dun, connected with  Clinton Newton  ([id number], [birth year]) on 05/23/21 at 12:00 PM EDT by a video-enabled telemedicine application and verified that I am speaking with the correct person using two identifiers.  Location: Patient: Virtual Visit Location Patient: Home Provider: Virtual Visit Location Provider: Home   I discussed the limitations of evaluation and management by telemedicine and the  availability of in person appointments. The patient expressed understanding and agreed to proceed.    History of Present Illness: Clinton Newton is a 70 y.o. who identifies as a male who was assigned male at birth, and is being seen today for COVID. He reports his symptoms started yesterday and tested positive this morning.   HPI: Cough This is a new problem. The current episode started yesterday. The problem has been unchanged. The problem occurs every few minutes. The cough is Non-productive. Associated symptoms include nasal congestion and postnasal drip. Pertinent negatives include no chills, ear congestion, ear pain, fever, headaches, myalgias, shortness of breath or wheezing. Treatments tried: tylenol.   Problems:  Patient Active Problem List   Diagnosis Date Noted   Anxiety 05/09/2021   BPH (benign prostatic hyperplasia) 05/09/2021   Ascending aorta dilation (HCC) 10/12/2019   Lumbar degenerative disc disease 11/20/2016   Chronic right-sided low back pain without sciatica 11/20/2016   Insomnia 03/14/2014   Hyperlipemia 08/31/2007   Essential hypertension 08/31/2007    Allergies: No Known Allergies Medications:  Current Outpatient Medications:    molnupiravir EUA 200 mg CAPS, Take 4 capsules (800 mg total) by mouth 2 (two) times daily for 5 days., Disp: 40 capsule, Rfl: 0   amLODipine (NORVASC) 10 MG tablet, TAKE 1 TABLET BY MOUTH EVERY DAY, Disp: 90 tablet, Rfl: 3   ascorbic acid (VITAMIN C) 1000  MG tablet, Take 1,000 mg by mouth daily., Disp: , Rfl:    bisoprolol (ZEBETA) 5 MG tablet, Take 0.5 tablets (2.5 mg total) by mouth daily., Disp: 45 tablet, Rfl: 3   busPIRone (BUSPAR) 7.5 MG tablet, TAKE 1 TABLET (7.5 MG TOTAL) BY MOUTH 3 (THREE) TIMES DAILY., Disp: 270 tablet, Rfl: 1   cholecalciferol (VITAMIN D) 1000 UNITS tablet, Take 1,000 Units by mouth daily., Disp: , Rfl:    Flaxseed, Linseed, (FLAXSEED OIL PO), Take by mouth as directed., Disp: , Rfl:    methocarbamol (ROBAXIN-750)  750 MG tablet, Take 1 tablet (750 mg total) by mouth 3 (three) times daily., Disp: 270 tablet, Rfl: 0   Misc Natural Products (BETA-SITOSTEROL PLANT STEROLS PO), , Disp: , Rfl:    Multiple Vitamin (MULTIVITAMIN) tablet, Take 1 tablet by mouth daily., Disp: , Rfl:    Omega-3 Fatty Acids (FISH OIL PO), Take by mouth daily., Disp: , Rfl:    Probiotic Product (PROBIOTIC PO), Take by mouth daily., Disp: , Rfl:    rosuvastatin (CRESTOR) 20 MG tablet, TAKE 1 TABLET BY MOUTH EVERY DAY, Disp: 90 tablet, Rfl: 3   Saw Palmetto 500 MG CAPS, , Disp: , Rfl:    vitamin B-12 (CYANOCOBALAMIN) 1000 MCG tablet, Take 1,000 mcg by mouth daily., Disp: , Rfl:    zolpidem (AMBIEN) 5 MG tablet, Take 1 tablet (5 mg total) by mouth at bedtime as needed. for sleep, Disp: 20 tablet, Rfl: 0  Observations/Objective: Patient is well-developed, well-nourished in no acute distress.  Resting comfortably at home.  Head is normocephalic, atraumatic.  No labored breathing.  Speech is clear and coherent with logical content.  Patient is alert and oriented at baseline.    Assessment and Plan: 1. COVID-19 virus detected - molnupiravir EUA 200 mg CAPS; Take 4 capsules (800 mg total) by mouth 2 (two) times daily for 5 days.  Dispense: 40 capsule; Refill: 0 COVID positive, rest, force fluids, tylenol as needed, Quarantine for at least 5 days and fever free, report any worsening symptoms such as increased shortness of breath, swelling, or continued high fevers.  Possible adverse effects discussed   Follow Up Instructions: I discussed the assessment and treatment plan with the patient. The patient was provided an opportunity to ask questions and all were answered. The patient agreed with the plan and demonstrated an understanding of the instructions.  A copy of instructions were sent to the patient via MyChart.  The patient was advised to call back or seek an in-person evaluation if the symptoms worsen or if the condition fails to  improve as anticipated.  Time:  I spent 11 minutes with the patient via telehealth technology discussing the above problems/concerns.    Evelina Dun, FNP

## 2021-05-27 ENCOUNTER — Telehealth: Payer: Medicare Other | Admitting: Family Medicine

## 2021-05-27 ENCOUNTER — Encounter: Payer: Self-pay | Admitting: Family Medicine

## 2021-06-02 DIAGNOSIS — M67911 Unspecified disorder of synovium and tendon, right shoulder: Secondary | ICD-10-CM | POA: Diagnosis not present

## 2021-06-06 ENCOUNTER — Ambulatory Visit: Payer: Medicare Other | Admitting: Orthopaedic Surgery

## 2021-06-09 ENCOUNTER — Telehealth: Payer: Self-pay | Admitting: Family Medicine

## 2021-06-09 NOTE — Telephone Encounter (Signed)
Copied from [facility] [id number]. Topic: Medicare AWV >> Jun 09, 2021 10:44 AM Harris-Coley, Hannah Beat wrote: Reason for CRM: Left message for patient to schedule Annual Wellness Visit.  Please schedule with Nurse Health Advisor Charlott Rakes, RN at [facility].  Please call [phone] ask for [facility]

## 2021-06-16 DIAGNOSIS — Z23 Encounter for immunization: Secondary | ICD-10-CM | POA: Diagnosis not present

## 2021-06-25 DIAGNOSIS — M25511 Pain in right shoulder: Secondary | ICD-10-CM | POA: Diagnosis not present

## 2021-06-25 DIAGNOSIS — M6281 Muscle weakness (generalized): Secondary | ICD-10-CM | POA: Diagnosis not present

## 2021-06-25 DIAGNOSIS — M25611 Stiffness of right shoulder, not elsewhere classified: Secondary | ICD-10-CM | POA: Diagnosis not present

## 2021-07-02 DIAGNOSIS — M67911 Unspecified disorder of synovium and tendon, right shoulder: Secondary | ICD-10-CM | POA: Diagnosis not present

## 2021-07-02 DIAGNOSIS — M25511 Pain in right shoulder: Secondary | ICD-10-CM | POA: Diagnosis not present

## 2021-07-02 DIAGNOSIS — M7541 Impingement syndrome of right shoulder: Secondary | ICD-10-CM | POA: Diagnosis not present

## 2021-07-02 DIAGNOSIS — M25611 Stiffness of right shoulder, not elsewhere classified: Secondary | ICD-10-CM | POA: Diagnosis not present

## 2021-07-02 DIAGNOSIS — M6281 Muscle weakness (generalized): Secondary | ICD-10-CM | POA: Diagnosis not present

## 2021-07-04 ENCOUNTER — Encounter: Payer: Self-pay | Admitting: Family Medicine

## 2021-07-04 DIAGNOSIS — M7541 Impingement syndrome of right shoulder: Secondary | ICD-10-CM | POA: Diagnosis not present

## 2021-07-04 DIAGNOSIS — M25511 Pain in right shoulder: Secondary | ICD-10-CM | POA: Diagnosis not present

## 2021-07-04 DIAGNOSIS — M6281 Muscle weakness (generalized): Secondary | ICD-10-CM | POA: Diagnosis not present

## 2021-07-04 DIAGNOSIS — M25611 Stiffness of right shoulder, not elsewhere classified: Secondary | ICD-10-CM | POA: Diagnosis not present

## 2021-07-07 MED ORDER — ZOLPIDEM TARTRATE 5 MG PO TABS: 5.0000 mg | ORAL_TABLET | Freq: Every evening | ORAL | 0 refills | Status: DC | PRN

## 2021-07-08 DIAGNOSIS — M7541 Impingement syndrome of right shoulder: Secondary | ICD-10-CM | POA: Diagnosis not present

## 2021-07-08 DIAGNOSIS — M25611 Stiffness of right shoulder, not elsewhere classified: Secondary | ICD-10-CM | POA: Diagnosis not present

## 2021-07-08 DIAGNOSIS — M6281 Muscle weakness (generalized): Secondary | ICD-10-CM | POA: Diagnosis not present

## 2021-07-08 DIAGNOSIS — M25511 Pain in right shoulder: Secondary | ICD-10-CM | POA: Diagnosis not present

## 2021-07-18 DIAGNOSIS — M6281 Muscle weakness (generalized): Secondary | ICD-10-CM | POA: Diagnosis not present

## 2021-07-18 DIAGNOSIS — M25511 Pain in right shoulder: Secondary | ICD-10-CM | POA: Diagnosis not present

## 2021-07-18 DIAGNOSIS — M25611 Stiffness of right shoulder, not elsewhere classified: Secondary | ICD-10-CM | POA: Diagnosis not present

## 2021-07-18 DIAGNOSIS — M7541 Impingement syndrome of right shoulder: Secondary | ICD-10-CM | POA: Diagnosis not present

## 2021-07-23 DIAGNOSIS — M7541 Impingement syndrome of right shoulder: Secondary | ICD-10-CM | POA: Diagnosis not present

## 2021-07-23 DIAGNOSIS — M6281 Muscle weakness (generalized): Secondary | ICD-10-CM | POA: Diagnosis not present

## 2021-07-23 DIAGNOSIS — M25511 Pain in right shoulder: Secondary | ICD-10-CM | POA: Diagnosis not present

## 2021-07-23 DIAGNOSIS — M25611 Stiffness of right shoulder, not elsewhere classified: Secondary | ICD-10-CM | POA: Diagnosis not present

## 2021-07-30 DIAGNOSIS — M67911 Unspecified disorder of synovium and tendon, right shoulder: Secondary | ICD-10-CM | POA: Diagnosis not present

## 2021-08-01 DIAGNOSIS — M7541 Impingement syndrome of right shoulder: Secondary | ICD-10-CM | POA: Diagnosis not present

## 2021-08-01 DIAGNOSIS — M6281 Muscle weakness (generalized): Secondary | ICD-10-CM | POA: Diagnosis not present

## 2021-08-01 DIAGNOSIS — M25611 Stiffness of right shoulder, not elsewhere classified: Secondary | ICD-10-CM | POA: Diagnosis not present

## 2021-08-01 DIAGNOSIS — M25511 Pain in right shoulder: Secondary | ICD-10-CM | POA: Diagnosis not present

## 2021-08-05 ENCOUNTER — Encounter: Payer: Self-pay | Admitting: Family Medicine

## 2021-08-05 ENCOUNTER — Telehealth: Payer: Self-pay | Admitting: Family Medicine

## 2021-08-05 NOTE — Telephone Encounter (Signed)
Called and left a message about visit scheduled for tomorrow about gel shot.  Asked for call back.

## 2021-08-06 ENCOUNTER — Ambulatory Visit: Payer: Self-pay

## 2021-08-06 ENCOUNTER — Ambulatory Visit (INDEPENDENT_AMBULATORY_CARE_PROVIDER_SITE_OTHER): Payer: Medicare Other | Admitting: Family Medicine

## 2021-08-06 ENCOUNTER — Other Ambulatory Visit: Payer: Self-pay

## 2021-08-06 DIAGNOSIS — M1711 Unilateral primary osteoarthritis, right knee: Secondary | ICD-10-CM

## 2021-08-06 DIAGNOSIS — G8929 Other chronic pain: Secondary | ICD-10-CM

## 2021-08-06 DIAGNOSIS — M25561 Pain in right knee: Secondary | ICD-10-CM | POA: Diagnosis not present

## 2021-08-06 NOTE — Progress Notes (Signed)
  Clinton Newton presents to clinic today for Orthovisc injection right knee 1/3  Procedure: Real-time Ultrasound Guided Injection of right knee superior lateral patellar space Device: Philips Affiniti 50G Images permanently stored and available for review in PACS Verbal informed consent obtained.  Discussed risks and benefits of procedure. Warned about infection bleeding damage to structures skin hypopigmentation and fat atrophy among others. Patient expresses understanding and agreement Time-out conducted.   Noted no overlying erythema, induration, or other signs of local infection.   Skin prepped in a sterile fashion.   Local anesthesia: Topical Ethyl chloride.   With sterile technique and under real time ultrasound guidance: Orthovisc 30 mg injected into knee joint. Fluid seen entering the joint capsule.   Completed without difficulty   Advised to call if fevers/chills, erythema, induration, drainage, or persistent bleeding.   Images permanently stored and available for review in the ultrasound unit.  Impression: Technically successful ultrasound guided injection.   Lot number: [id number]  Return in 1 week for Orthovisc injection right knee 2/3

## 2021-08-06 NOTE — Patient Instructions (Signed)
Thank you for coming in today.   You received an injection today. Seek immediate medical attention if the joint becomes red, extremely painful, or is oozing fluid.   Please schedule your second Orthovisc injection for next week and the 3rd for the following week.

## 2021-08-08 DIAGNOSIS — M7541 Impingement syndrome of right shoulder: Secondary | ICD-10-CM | POA: Diagnosis not present

## 2021-08-08 DIAGNOSIS — M25611 Stiffness of right shoulder, not elsewhere classified: Secondary | ICD-10-CM | POA: Diagnosis not present

## 2021-08-08 DIAGNOSIS — M6281 Muscle weakness (generalized): Secondary | ICD-10-CM | POA: Diagnosis not present

## 2021-08-08 DIAGNOSIS — M25511 Pain in right shoulder: Secondary | ICD-10-CM | POA: Diagnosis not present

## 2021-08-13 ENCOUNTER — Ambulatory Visit (INDEPENDENT_AMBULATORY_CARE_PROVIDER_SITE_OTHER): Payer: Medicare Other | Admitting: Family Medicine

## 2021-08-13 ENCOUNTER — Ambulatory Visit: Payer: Self-pay

## 2021-08-13 ENCOUNTER — Other Ambulatory Visit: Payer: Self-pay

## 2021-08-13 DIAGNOSIS — M6281 Muscle weakness (generalized): Secondary | ICD-10-CM | POA: Diagnosis not present

## 2021-08-13 DIAGNOSIS — M25561 Pain in right knee: Secondary | ICD-10-CM | POA: Diagnosis not present

## 2021-08-13 DIAGNOSIS — G8929 Other chronic pain: Secondary | ICD-10-CM | POA: Diagnosis not present

## 2021-08-13 DIAGNOSIS — M25511 Pain in right shoulder: Secondary | ICD-10-CM | POA: Diagnosis not present

## 2021-08-13 DIAGNOSIS — M25611 Stiffness of right shoulder, not elsewhere classified: Secondary | ICD-10-CM | POA: Diagnosis not present

## 2021-08-13 DIAGNOSIS — M7541 Impingement syndrome of right shoulder: Secondary | ICD-10-CM | POA: Diagnosis not present

## 2021-08-13 DIAGNOSIS — M1711 Unilateral primary osteoarthritis, right knee: Secondary | ICD-10-CM | POA: Diagnosis not present

## 2021-08-13 NOTE — Patient Instructions (Signed)
Thank you for coming in today.   You received an injection today. Seek immediate medical attention if the joint becomes red, extremely painful, or is oozing fluid.   See you next week for the 3rd gel shot.

## 2021-08-13 NOTE — Progress Notes (Signed)
Clinton Newton presents to clinic today for Orthovisc injection right knee 2/3  Procedure: Real-time Ultrasound Guided Injection of right knee superior lateral patellar space Device: Philips Affiniti 50G Images permanently stored and available for review in PACS Verbal informed consent obtained.  Discussed risks and benefits of procedure. Warned about infection bleeding damage to structures skin hypopigmentation and fat atrophy among others. Patient expresses understanding and agreement Time-out conducted.   Noted no overlying erythema, induration, or other signs of local infection.   Skin prepped in a sterile fashion.   Local anesthesia: Topical Ethyl chloride.   With sterile technique and under real time ultrasound guidance: Orthovisc 30 mg injected into knee joint. Fluid seen entering the joint capsule.   Completed without difficulty   Pain immediately resolved suggesting accurate placement of the medication.   Advised to call if fevers/chills, erythema, induration, drainage, or persistent bleeding.   Images permanently stored and available for review in the ultrasound unit.  Impression: Technically successful ultrasound guided injection.   Lot number: [id number]  Return in 1 week for Orthovisc injection 3/3 right knee

## 2021-08-14 ENCOUNTER — Ambulatory Visit: Payer: Medicare Other | Admitting: Orthopaedic Surgery

## 2021-08-22 ENCOUNTER — Ambulatory Visit: Payer: Medicare Other | Admitting: Family Medicine

## 2021-08-25 ENCOUNTER — Ambulatory Visit: Payer: Self-pay

## 2021-08-25 ENCOUNTER — Other Ambulatory Visit: Payer: Self-pay

## 2021-08-25 ENCOUNTER — Ambulatory Visit (INDEPENDENT_AMBULATORY_CARE_PROVIDER_SITE_OTHER): Payer: Medicare Other | Admitting: Family Medicine

## 2021-08-25 DIAGNOSIS — G8929 Other chronic pain: Secondary | ICD-10-CM | POA: Diagnosis not present

## 2021-08-25 DIAGNOSIS — M25561 Pain in right knee: Secondary | ICD-10-CM

## 2021-08-25 DIAGNOSIS — M1711 Unilateral primary osteoarthritis, right knee: Secondary | ICD-10-CM | POA: Diagnosis not present

## 2021-08-25 NOTE — Patient Instructions (Signed)
Good to see you today.  You had your final R knee Orthovisc injection.  Call or go to the ER if you develop a large red swollen joint with extreme pain or oozing puss.   Follow-up as needed.

## 2021-08-25 NOTE — Progress Notes (Signed)
Burnham presents to clinic today for Orthovisc injection right knee 3/3  Procedure: Real-time Ultrasound Guided Injection of right knee superior lateral patellar space Device: Philips Affiniti 50G Images permanently stored and available for review in PACS Verbal informed consent obtained.  Discussed risks and benefits of procedure. Warned about infection bleeding damage to structures skin hypopigmentation and fat atrophy among others. Patient expresses understanding and agreement Time-out conducted.   Noted no overlying erythema, induration, or other signs of local infection.   Skin prepped in a sterile fashion.   Local anesthesia: Topical Ethyl chloride.   With sterile technique and under real time ultrasound guidance: Orthovisc 30 mg injected into knee joint. Fluid seen entering the joint capsule.   Completed without difficulty   Advised to call if fevers/chills, erythema, induration, drainage, or persistent bleeding.   Images permanently stored and available for review in the ultrasound unit.  Impression: Technically successful ultrasound guided injection.  Lot number: [id number]  Recheck as needed

## 2021-08-27 DIAGNOSIS — M25511 Pain in right shoulder: Secondary | ICD-10-CM | POA: Diagnosis not present

## 2021-08-27 DIAGNOSIS — M6281 Muscle weakness (generalized): Secondary | ICD-10-CM | POA: Diagnosis not present

## 2021-08-27 DIAGNOSIS — M25611 Stiffness of right shoulder, not elsewhere classified: Secondary | ICD-10-CM | POA: Diagnosis not present

## 2021-08-27 DIAGNOSIS — M7541 Impingement syndrome of right shoulder: Secondary | ICD-10-CM | POA: Diagnosis not present

## 2021-08-29 DIAGNOSIS — Z23 Encounter for immunization: Secondary | ICD-10-CM | POA: Diagnosis not present

## 2021-09-03 DIAGNOSIS — M6281 Muscle weakness (generalized): Secondary | ICD-10-CM | POA: Diagnosis not present

## 2021-09-03 DIAGNOSIS — M25611 Stiffness of right shoulder, not elsewhere classified: Secondary | ICD-10-CM | POA: Diagnosis not present

## 2021-09-03 DIAGNOSIS — M25511 Pain in right shoulder: Secondary | ICD-10-CM | POA: Diagnosis not present

## 2021-09-03 DIAGNOSIS — M7541 Impingement syndrome of right shoulder: Secondary | ICD-10-CM | POA: Diagnosis not present

## 2021-10-01 DIAGNOSIS — D2272 Melanocytic nevi of left lower limb, including hip: Secondary | ICD-10-CM | POA: Diagnosis not present

## 2021-10-01 DIAGNOSIS — D225 Melanocytic nevi of trunk: Secondary | ICD-10-CM | POA: Diagnosis not present

## 2021-10-01 DIAGNOSIS — D2261 Melanocytic nevi of right upper limb, including shoulder: Secondary | ICD-10-CM | POA: Diagnosis not present

## 2021-10-01 DIAGNOSIS — D2372 Other benign neoplasm of skin of left lower limb, including hip: Secondary | ICD-10-CM | POA: Diagnosis not present

## 2021-10-01 DIAGNOSIS — L814 Other melanin hyperpigmentation: Secondary | ICD-10-CM | POA: Diagnosis not present

## 2021-10-01 DIAGNOSIS — L821 Other seborrheic keratosis: Secondary | ICD-10-CM | POA: Diagnosis not present

## 2021-10-01 DIAGNOSIS — D2262 Melanocytic nevi of left upper limb, including shoulder: Secondary | ICD-10-CM | POA: Diagnosis not present

## 2021-10-01 DIAGNOSIS — D1801 Hemangioma of skin and subcutaneous tissue: Secondary | ICD-10-CM | POA: Diagnosis not present

## 2021-10-22 ENCOUNTER — Other Ambulatory Visit: Payer: Self-pay

## 2021-10-22 ENCOUNTER — Other Ambulatory Visit (HOSPITAL_BASED_OUTPATIENT_CLINIC_OR_DEPARTMENT_OTHER): Payer: Self-pay

## 2021-10-22 ENCOUNTER — Encounter (HOSPITAL_BASED_OUTPATIENT_CLINIC_OR_DEPARTMENT_OTHER): Payer: Self-pay | Admitting: Nurse Practitioner

## 2021-10-22 ENCOUNTER — Ambulatory Visit (INDEPENDENT_AMBULATORY_CARE_PROVIDER_SITE_OTHER): Payer: Medicare Other | Admitting: Nurse Practitioner

## 2021-10-22 VITALS — BP 117/82 | HR 81 | Ht 68.0 in | Wt 192.0 lb

## 2021-10-22 DIAGNOSIS — F419 Anxiety disorder, unspecified: Secondary | ICD-10-CM

## 2021-10-22 DIAGNOSIS — I10 Essential (primary) hypertension: Secondary | ICD-10-CM

## 2021-10-22 DIAGNOSIS — R739 Hyperglycemia, unspecified: Secondary | ICD-10-CM | POA: Diagnosis not present

## 2021-10-22 DIAGNOSIS — N401 Enlarged prostate with lower urinary tract symptoms: Secondary | ICD-10-CM | POA: Diagnosis not present

## 2021-10-22 DIAGNOSIS — I77810 Thoracic aortic ectasia: Secondary | ICD-10-CM | POA: Diagnosis not present

## 2021-10-22 DIAGNOSIS — E782 Mixed hyperlipidemia: Secondary | ICD-10-CM | POA: Diagnosis not present

## 2021-10-22 DIAGNOSIS — N3943 Post-void dribbling: Secondary | ICD-10-CM

## 2021-10-22 DIAGNOSIS — G4700 Insomnia, unspecified: Secondary | ICD-10-CM | POA: Diagnosis not present

## 2021-10-22 MED ORDER — ZOLPIDEM TARTRATE 5 MG PO TABS
5.0000 mg | ORAL_TABLET | Freq: Every evening | ORAL | 0 refills | Status: DC | PRN
Start: 2021-10-22 — End: 2022-01-13

## 2021-10-22 NOTE — Assessment & Plan Note (Signed)
BP well controlled today on amlodipine 10mg  and bisoprolol 1.25-2.5mg  daily.  No side effects of medication reported today. No alarm symptoms present.  Discussed the option of treatment for BPH with alpha blocker medication and may need to taper down or stop BP medication if hypotension results.  We will monitor labs today and evaluate.

## 2021-10-22 NOTE — Assessment & Plan Note (Signed)
Well controlled on rosuvastatin.  Will monitor labs today for evaluation.  No alarm symptoms present.  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  Will follow closely

## 2021-10-22 NOTE — Assessment & Plan Note (Signed)
Intermittent insomnia when traveling.  Very infrequent use of ambien during these times with no concerns for daytime sleepiness or altered mental state.  Recommend continue to use sparingly, as this is not recommended as chronic daily medication in patients older than 65.  Will continue to monitor.  Refill provided today

## 2021-10-22 NOTE — Assessment & Plan Note (Signed)
Intermittent anxiety symptoms, mostly related to social activities.  Reports well controlled with PRN buspar.  OK to continue medication as needed.

## 2021-10-22 NOTE — Patient Instructions (Addendum)
Thank you for choosing [place] at [facility] for your Primary Care needs. I am excited for the opportunity to partner with you to meet your health care goals. It was a pleasure meeting you today!  Recommendations from today's visit: I will review your prostate labs and then we will determine the best treatment for the inflammation.  If there are any other concerns we will let you know.  I will send in the refill for Ambien for you today Please feel free to let me know if you have any questions or concerns  Information on diet, exercise, and health maintenance recommendations are listed below. This is information to help you be sure you are on track for optimal health and monitoring.   Please look over this and let us know if you have any questions or if you have completed any of the health maintenance outside of [place] so that we can be sure your records are up to date.  ___________________________________________________________ About Me: I am an Adult-Geriatric Nurse Practitioner with a background in caring for patients for more than 20 years with a strong intensive care background. I provide primary care and sports medicine services to patients age 91 and older within this office. My education had a strong focus on caring for the older adult population, which I am passionate about. I am also the director of the APP Fellowship with [facility].   My desire is to provide you with the best service through preventive medicine and supportive care. I consider you a part of the medical team and value your input. I work diligently to ensure that you are heard and your needs are met in a safe and effective manner. I want you to feel comfortable with me as your provider and want you to know that your health concerns are important to me.  For your information, our office hours are: Monday, Tuesday, and Thursday 8:00 AM - 5:00 PM Wednesday and Friday 8:00 AM - 12:00 PM.   In  my time away from the office I am teaching new APP's within the system and am unavailable, but my partner, Dr. Burnard Bunting is in the office for emergent needs.   If you have questions or concerns, please call our office at [phone] or send [place] a MyChart message and we will respond as quickly as possible.  ____________________________________________________________ MyChart:  For all urgent or time sensitive needs we ask that you please call the office to avoid delays. Our number is [phone]. MyChart is not constantly monitored and due to the large volume of messages a day, replies may take up to 72 business hours.  MyChart Policy: MyChart allows for you to see your visit notes, after visit summary, provider recommendations, lab and tests results, make an appointment, request refills, and contact your provider or the office for non-urgent questions or concerns. Providers are seeing patients during normal business hours and do not have built in time to review MyChart messages.  We ask that you allow a minimum of 3 business days for responses to [employer]. For this reason, please do not send urgent requests through [place]. Please call the office at [phone]. New and ongoing conditions may require a visit. We have virtual and in person visit available for your convenience.  Complex MyChart concerns may require a visit. Your provider may request you schedule a virtual or in person visit to ensure we are providing the best care possible. MyChart messages sent after 11:00 AM on  Friday will not be received by the provider until Monday morning.    Lab and Test Results: You will receive your lab and test results on MyChart as soon as they are completed and results have been sent by the lab or testing facility. Due to this service, you will receive your results BEFORE your provider.  I review lab and tests results each morning prior to seeing patients. Some results require collaboration with  other providers to ensure you are receiving the most appropriate care. For this reason, we ask that you please allow a minimum of 3-5 business days from the time the ALL results have been received for your provider to receive and review lab and test results and contact you about these.  Most lab and test result comments from the provider will be sent through [place]. Your provider may recommend changes to the plan of care, follow-up visits, repeat testing, ask questions, or request an office visit to discuss these results. You may reply directly to this message or call the office at [phone] to provide information for the provider or set up an appointment. In some instances, you will be called with test results and recommendations. Please let us know if this is preferred and we will make note of this in your chart to provide this for you.    If you have not heard a response to your lab or test results in 5 business days from all results returning to [place], please call the office to let us know. We ask that you please avoid calling prior to this time unless there is an emergent concern. Due to high call volumes, this can delay the resulting process.  After Hours: For all non-emergency after hours needs, please call the office at [phone] and select the option to reach the on-call provider service. On-call services are shared between multiple [place] offices and therefore it will not be possible to speak directly with your provider. On-call providers may provide medical advice and recommendations, but are unable to provide refills for maintenance medications.  For all emergency or urgent medical needs after normal business hours, we recommend that you seek care at the closest Urgent Care or Emergency Department to ensure appropriate treatment in a timely manner.  [facility] has a 24 hour emergency room located on the ground floor for your convenience.   Urgent Concerns  During the Business Day Providers are seeing patients from 8AM to [place] with a busy schedule and are most often not able to respond to non-urgent calls until the end of the day or the next business day. If you should have URGENT concerns during the day, please call and speak to the nurse or schedule a same day appointment so that we can address your concern without delay.   Thank you, again, for choosing me as your health care partner. I appreciate your trust and look forward to learning more about you.   Worthy Keeler, DNP, AGNP-c ___________________________________________________________  Health Maintenance Recommendations Screening Testing Mammogram Every 1 -2 years based on history and risk factors Starting at age 85 Pap Smear Ages 21-39 every 3 years Ages 59-65 every 5 years with HPV testing More frequent testing may be required based on results and history Colon Cancer Screening Every 1-10 years based on test performed, risk factors, and history Starting at age 59 Bone Density Screening Every 2-10 years based on history Starting at age 78 for women Recommendations for men differ based on medication usage, history,  and risk factors AAA Screening One time ultrasound Men 37-47 years old who have every smoked Lung Cancer Screening Low Dose Lung CT every 12 months Age 20-80 years with a 30 pack-year smoking history who still smoke or who have quit within the last 15 years  Screening Labs Routine  Labs: Complete Blood Count (CBC), Complete Metabolic Panel (CMP), Cholesterol (Lipid Panel) Every 6-12 months based on history and medications May be recommended more frequently based on current conditions or previous results Hemoglobin A1c Lab Every 3-12 months based on history and previous results Starting at age 24 or earlier with diagnosis of diabetes, high cholesterol, BMI >26, and/or risk factors Frequent monitoring for patients with diabetes to ensure blood sugar  control Thyroid Panel (TSH w/ T3 & T4) Every 6 months based on history, symptoms, and risk factors May be repeated more often if on medication HIV One time testing for all patients 47 and older May be repeated more frequently for patients with increased risk factors or exposure Hepatitis C One time testing for all patients 61 and older May be repeated more frequently for patients with increased risk factors or exposure Gonorrhea, Chlamydia Every 12 months for all sexually active persons 13-24 years Additional monitoring may be recommended for those who are considered high risk or who have symptoms PSA Men 97-80 years old with risk factors Additional screening may be recommended from age 71-69 based on risk factors, symptoms, and history  Vaccine Recommendations Tetanus Booster All adults every 10 years Flu Vaccine All patients 6 months and older every year COVID Vaccine All patients 12 years and older Initial dosing with booster May recommend additional booster based on age and health history HPV Vaccine 2 doses all patients age 62-26 Dosing may be considered for patients over 26 Shingles Vaccine (Shingrix) 2 doses all adults 29 years and older Pneumonia (Pneumovax 23) All adults 8 years and older May recommend earlier dosing based on health history Pneumonia (Prevnar 72) All adults 22 years and older Dosed 1 year after Pneumovax 23  Additional Screening, Testing, and Vaccinations may be recommended on an individualized basis based on family history, health history, risk factors, and/or exposure.  __________________________________________________________  Diet Recommendations for All Patients  I recommend that all patients maintain a diet low in saturated fats, carbohydrates, and cholesterol. While this can be challenging at first, it is not impossible and small changes can make big differences.  Things to try: Decreasing the amount of soda, sweet tea, and/or juice to  one or less per day and replace with water While water is always the first choice, if you do not like water you may consider adding a water additive without sugar to improve the taste other sugar free drinks Replace potatoes with a brightly colored vegetable at dinner Use healthy oils, such as canola oil or olive oil, instead of butter or hard margarine Limit your bread intake to two pieces or less a day Replace regular pasta with low carb pasta options Bake, broil, or grill foods instead of frying Monitor portion sizes  Eat smaller, more frequent meals throughout the day instead of large meals  An important thing to remember is, if you love foods that are not great for your health, you don't have to give them up completely. Instead, allow these foods to be a reward when you have done well. Allowing yourself to still have special treats every once in a while is a nice way to tell yourself thank you for working hard to  keep yourself healthy.   Also remember that every day is a new day. If you have a bad day and "fall off the wagon", you can still climb right back up and keep moving along on your journey!  We have resources available to help you!  Some websites that may be helpful include: www.http://carter.biz/  Www.VeryWellFit.com _____________________________________________________________  Activity Recommendations for All Patients  I recommend that all adults get at least 20 minutes of moderate physical activity that elevates your heart rate at least 5 days out of the week.  Some examples include: Walking or jogging at a pace that allows you to carry on a conversation Cycling (stationary bike or outdoors) Water aerobics Yoga Weight lifting Dancing If physical limitations prevent you from putting stress on your joints, exercise in a pool or seated in a chair are excellent options.  Do determine your MAXIMUM heart rate for activity: YOUR AGE - 220 = MAX HeartRate   Remember! Do not  push yourself too hard.  Start slowly and build up your pace, speed, weight, time in exercise, etc.  Allow your body to rest between exercise and get good sleep. You will need more water than normal when you are exerting yourself. Do not wait until you are thirsty to drink. Drink with a purpose of getting in at least 8, 8 ounce glasses of water a day plus more depending on how much you exercise and sweat.    If you begin to develop dizziness, chest pain, abdominal pain, jaw pain, shortness of breath, headache, vision changes, lightheadedness, or other concerning symptoms, stop the activity and allow your body to rest. If your symptoms are severe, seek emergency evaluation immediately. If your symptoms are concerning, but not severe, please let us know so that we can recommend further evaluation.

## 2021-10-22 NOTE — Assessment & Plan Note (Signed)
BPH with historical evaluation and treatment with Dr. Jeffie Pollock.  Symptoms are bothersome and no longer controlled with supplements at this time. Discussed option to begin Alpha adrenergic receptor antagonist for treatment of this, but would first like to monitor PSA level to ensure that this is not worsening or showing signs of further evaluation.  If PSA stable, will consider terazosin/doxazosin and possible taper of amlodipine as these should help with blood pressure. If PSA changing, will consider referral back to urology for further evaluation and recommendations.

## 2021-10-22 NOTE — Assessment & Plan Note (Signed)
Chart review shows stable appearance in May 2022 with no concerning findings.  BP is well controlled today. No alarm symptoms present.  No recommended follow-up on echocardiogram report.  May consider serial review in 2 years to ensure stability and closely monitor BP and cholesterol levels.

## 2021-10-22 NOTE — Progress Notes (Signed)
Orma Render, DNP, AGNP-c [facility] [address]   [facility], [place] [address] [phone]  New patient visit   Patient: Clinton Newton   DOB: 07-Nov-1950   70 y.o. Male  MRN: [id number] Visit Date: 10/22/2021  Patient Care Team: Richard Ritchey, Coralee Pesa, NP as PCP - General (Nurse Practitioner) Irine Seal, MD as Attending Physician (Urology) Gaynelle Arabian, MD as Consulting Physician (Orthopedic Surgery) Crista Luria, MD as Consulting Physician (Dermatology) Ladene Artist, MD as Consulting Physician (Gastroenterology) Luberta Mutter, MD as Consulting Physician (Ophthalmology)  Today's healthcare provider: Orma Render, NP   Chief Complaint  Patient presents with   New Patient (Initial Visit)    Patient presents today to establish care. Patient stated he would like a complete panel of lab work today.    Subjective    HPI  Clinton Newton is a pleasant 70 y.o. male who presents today as a new patient to establish care.    He endorses concerns today of "popping" sensation in the left side of his jaw when chewing that has been going on for a few weeks. He denies any known injury. He has been using the right side of his mouth when eating foods that require more chewing. He denies pain.   He also endorses a history of symptoms of BPH with slow/weak stream, sensation of incomplete emptying, terminal dribble, difficulty starting/stopping stream, and nocturia. He has seen Dr. Jeffie Pollock with urology in the past and was diagnosed with BPH, PSA levels were normal at that time. He has been taking supplements, which initially helped with his symptoms, but he did not feel they were helping any longer, therefore he stopped taking them.   He endorses a history of HTN for which he is taking amlodipine and bisoprolol. He endorses excellent control of his BP on this regimen. He does have a history of intermittent tachycardia, which is why he started the beta  blocker. He tells me that this helps keep his heart rate at a normal level and he does not notice any palpitations. He has no known side effects of the medications.   He is taking rosuvastatin for cholesterol management. He has no side effects or concerns with the medication.   He reports using ambien as needed for sleep. He mostly uses this during travel and tolerates the medication well.  He uses buspar PRN for increased anxiety provoking situations and reports this works very well for him.   He endorses a history of rotator cuff injury for which he has undergone physical therapy and injections. The symptoms have improved significantly, but he is still experiencing some pain and decreased ROM in the left shoulder. He is currently seeing Dr. Georgina Snell for this.   He is married and lives at home with his wife. 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  He endorses feeling safe at home and in his environment. He does not smoke or use recreational drugs. He endorses wine on occasion, but no more than 1 at a time.   Past Medical History:  Diagnosis Date   Arthritis    L3-L4   Cataract    Colon polyps    Diverticulitis    GERD (gastroesophageal reflux disease)    Hyperlipidemia    Hypertension    Past Surgical History:  Procedure Laterality Date   APPENDECTOMY     COLONOSCOPY     POLYPECTOMY  PRE-MALIGNANT / BENIGN SKIN LESION EXCISION     Family Status  Relation Name Status   PGF  Deceased   PGM  Deceased   MGF  Deceased   MGM  Deceased   Father  (Not Specified)   Neg Hx  (Not Specified)   Family History  Problem Relation Age of Onset   Heart disease Father    Colon cancer Neg Hx    Pancreatic cancer Neg Hx    Rectal cancer Neg Hx    Stomach cancer Neg Hx    Esophageal cancer Neg Hx    Social History   Socioeconomic History   Marital status: Married    Spouse name: Not on file   Number of children: 0   Years of education: Not on  file   Highest education level: Not on file  Occupational History   Occupation: Retired  Tobacco Use   Smoking status: Never   Smokeless tobacco: Never  Vaping Use   Vaping Use: Never used  Substance and Sexual Activity   Alcohol use: Yes    Alcohol/week: 7.0 standard drinks    Types: 7 Glasses of wine per week   Drug use: No   Sexual activity: Yes  Other Topics Concern   Not on file  Social History Narrative   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   bisoprolol (ZEBETA) 5 MG tablet Take 0.5 tablets (2.5 mg total) by mouth daily.   busPIRone (BUSPAR) 7.5 MG tablet TAKE 1 TABLET (7.5 MG TOTAL) BY MOUTH 3 (THREE) TIMES DAILY.   cholecalciferol (VITAMIN D) 1000 UNITS tablet Take 1,000 Units by mouth daily.   Flaxseed, Linseed, (FLAXSEED OIL PO) Take by mouth as directed.   methocarbamol (ROBAXIN-750) 750 MG tablet Take 1 tablet (750 mg total) by mouth 3 (three) times daily.   Misc Natural Products (BETA-SITOSTEROL PLANT STEROLS PO)    Multiple Vitamin (MULTIVITAMIN) tablet Take 1 tablet by mouth daily.   Omega-3 Fatty Acids (FISH OIL PO) Take by mouth daily.   Probiotic Product (PROBIOTIC PO) Take by mouth daily.   rosuvastatin (CRESTOR) 20 MG tablet TAKE 1 TABLET BY MOUTH EVERY DAY   Saw Palmetto 500 MG CAPS    vitamin B-12 (CYANOCOBALAMIN) 1000 MCG tablet Take 1,000 mcg by mouth  daily.   [DISCONTINUED] zolpidem (AMBIEN) 5 MG tablet Take 1 tablet (5 mg total) by mouth at bedtime as needed. for sleep   No facility-administered medications prior to visit.   No Known Allergies  Immunization History  Administered Date(s) Administered   Fluad Quad(high Dose 65+) 06/23/2019, 07/24/2020   Influenza Split 09/19/2012   Influenza Whole 08/30/2007, 07/11/2010   Influenza, High Dose Seasonal PF 06/23/2018   Influenza,inj,Quad PF,6+ Mos 07/18/2013, 06/28/2015, 07/07/2017   Influenza-Unspecified 08/25/2014, 07/07/2016,  06/26/2017, 06/16/2021   PFIZER(Purple Top)SARS-COV-2 Vaccination 11/30/2019, 12/21/2019, 06/17/2020   Pfizer Covid-19 Vaccine Bivalent Booster 57yrs & up 08/29/2021   Pneumococcal Conjugate-13 01/11/2018   Pneumococcal Polysaccharide-23 06/23/2019   Td 04/28/2007, 09/09/2017   Zoster Recombinat (Shingrix) 05/14/2020, 10/02/2020   Zoster, Live 03/22/2013    Health Maintenance  Topic Date Due   COLONOSCOPY (Pts 45-65yrs Insurance coverage will need to be confirmed)  11/09/2022   TETANUS/TDAP  09/10/2027   Pneumonia Vaccine 72+ Years old  Completed   INFLUENZA VACCINE  Completed   COVID-19 Vaccine  Completed   Hepatitis C Screening  Completed   Zoster Vaccines- Shingrix  Completed   HPV VACCINES  Aged Out    Patient Care Team: Charday Capetillo, Coralee Pesa, NP as PCP - General (Nurse Practitioner) Irine Seal, MD as Attending Physician (Urology) Gaynelle Arabian, MD as Consulting Physician (Orthopedic Surgery) Crista Luria, MD as Consulting Physician (Dermatology) Ladene Artist, MD as Consulting Physician (Gastroenterology) Luberta Mutter, MD as Consulting Physician (Ophthalmology)  Review of Systems All review of systems negative except what is listed in the HPI   Objective    BP 117/82    Pulse 81    Ht 5\' 8"  (1.727 m)    Wt 192 lb (87.1 kg)    SpO2 96%    BMI 29.19 kg/m  Physical Exam Vitals and nursing note reviewed.  Constitutional:       Appearance: Normal appearance. He is normal weight.  HENT:     Head: Normocephalic.  Eyes:     Extraocular Movements: Extraocular movements intact.     Pupils: Pupils are equal, round, and reactive to light.  Neck:     Vascular: No carotid bruit.  Cardiovascular:     Rate and Rhythm: Normal rate and regular rhythm.     Pulses: Normal pulses.     Heart sounds: Normal heart sounds.  Pulmonary:     Effort: Pulmonary effort is normal.     Breath sounds: Normal breath sounds.  Abdominal:     General: Bowel sounds are normal. There is no distension.     Palpations: Abdomen is soft.     Tenderness: There is no abdominal tenderness. There is no right CVA tenderness or left CVA tenderness.  Musculoskeletal:        General: Normal range of motion.     Cervical back: Normal range of motion.     Right lower leg: No edema.     Left lower leg: No edema.  Lymphadenopathy:     Cervical: No cervical adenopathy.  Skin:    General: Skin is warm and dry.     Capillary Refill: Capillary refill takes less than 2 seconds.  Neurological:     General: No focal deficit present.     Mental Status: He is alert and oriented to person, place, and time.  Psychiatric:        Mood and Affect: Mood normal.        Behavior: Behavior normal.        Thought Content: Thought content normal.        Judgment: Judgment normal.    Depression Screen PHQ 2/9 Scores 05/09/2021 07/24/2020 02/22/2020 12/25/2019  PHQ - 2 Score 0 0 0 0  PHQ- 9 Score - - - 0  Exception Documentation - - - -   No results found for any visits on 10/22/21.  Assessment & Plan      Problem List Items Addressed This Visit     Hyperlipemia  Well controlled on rosuvastatin.  Will monitor labs today for evaluation.  No alarm symptoms present.  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  Will follow closely      Relevant Orders   CBC with Differential/Platelet   Comprehensive metabolic panel   Lipid panel    Hemoglobin A1c   PSA Total (Reflex To Free)   Essential hypertension    BP well controlled today on amlodipine 10mg  and bisoprolol 1.25-2.5mg  daily.  No side effects of medication reported today. No alarm symptoms present.  Discussed the option of treatment for BPH with alpha blocker medication and may need to taper down or stop BP medication if hypotension results.  We will monitor labs today and evaluate.       Relevant Orders   CBC with Differential/Platelet   Comprehensive metabolic panel   Lipid panel   Hemoglobin A1c   PSA Total (Reflex To Free)   Insomnia    Intermittent insomnia when traveling.  Very infrequent use of ambien during these times with no concerns for daytime sleepiness or altered mental state.  Recommend continue to use sparingly, as this is not recommended as chronic daily medication in patients older than 65.  Will continue to monitor.  Refill provided today      Relevant Medications   zolpidem (AMBIEN) 5 MG tablet   Other Relevant Orders   CBC with Differential/Platelet   Comprehensive metabolic panel   Lipid panel   Hemoglobin A1c   PSA Total (Reflex To Free)   Ascending aorta dilation (HCC)    Chart review shows stable appearance in May 2022 with no concerning findings.  BP is well controlled today. No alarm symptoms present.  No recommended follow-up on echocardiogram report.  May consider serial review in 2 years to ensure stability and closely monitor BP and cholesterol levels.       Anxiety    Intermittent anxiety symptoms, mostly related to social activities.  Reports well controlled with PRN buspar.  OK to continue medication as needed.       Relevant Orders   CBC with Differential/Platelet   Comprehensive metabolic panel   Lipid panel   Hemoglobin A1c   PSA Total (Reflex To Free)   BPH (benign prostatic hyperplasia) - Primary    BPH with historical evaluation and treatment with Dr. Jeffie Pollock.  Symptoms are bothersome and no longer  controlled with supplements at this time. Discussed option to begin Alpha adrenergic receptor antagonist for treatment of this, but would first like to monitor PSA level to ensure that this is not worsening or showing signs of further evaluation.  If PSA stable, will consider terazosin/doxazosin and possible taper of amlodipine as these should help with blood pressure. If PSA changing, will consider referral back to urology for further evaluation and recommendations.       Relevant Orders   CBC with Differential/Platelet   Comprehensive metabolic panel   Lipid panel   Hemoglobin A1c   PSA Total (Reflex To Free)   Other Visit Diagnoses     Elevated blood sugar       Relevant Orders   CBC with Differential/Platelet   Comprehensive metabolic panel   Lipid panel   Hemoglobin A1c   PSA Total (Reflex To Free)        Return for TBD based on labs.      Jaritza Duignan, Coralee Pesa, NP, DNP, AGNP-C [facility] at [place]

## 2021-10-23 ENCOUNTER — Encounter (HOSPITAL_BASED_OUTPATIENT_CLINIC_OR_DEPARTMENT_OTHER): Payer: Self-pay | Admitting: Nurse Practitioner

## 2021-10-23 LAB — CBC WITH DIFFERENTIAL/PLATELET
Basophils Absolute: 0 10*3/uL (ref 0.0–0.2)
Basos: 0 %
EOS (ABSOLUTE): 0.1 10*3/uL (ref 0.0–0.4)
Eos: 3 %
Hematocrit: 49.2 % (ref 37.5–51.0)
Hemoglobin: 17.4 g/dL (ref 13.0–17.7)
Immature Grans (Abs): 0 10*3/uL (ref 0.0–0.1)
Immature Granulocytes: 0 %
Lymphocytes Absolute: 1.1 10*3/uL (ref 0.7–3.1)
Lymphs: 23 %
MCH: 33.2 pg — ABNORMAL HIGH (ref 26.6–33.0)
MCHC: 35.4 g/dL (ref 31.5–35.7)
MCV: 94 fL (ref 79–97)
Monocytes Absolute: 0.7 10*3/uL (ref 0.1–0.9)
Monocytes: 14 %
Neutrophils Absolute: 2.8 10*3/uL (ref 1.4–7.0)
Neutrophils: 60 %
Platelets: 204 10*3/uL (ref 150–450)
RBC: 5.24 x10E6/uL (ref 4.14–5.80)
RDW: 12.6 % (ref 11.6–15.4)
WBC: 4.8 10*3/uL (ref 3.4–10.8)

## 2021-10-23 LAB — LIPID PANEL
Chol/HDL Ratio: 2.4 ratio (ref 0.0–5.0)
Cholesterol, Total: 189 mg/dL (ref 100–199)
HDL: 79 mg/dL (ref >39)
LDL Chol Calc (NIH): 70 mg/dL (ref 0–99)
Triglycerides: 250 mg/dL — ABNORMAL HIGH (ref 0–149)
VLDL Cholesterol Cal: 40 mg/dL (ref 5–40)

## 2021-10-23 LAB — PSA TOTAL (REFLEX TO FREE): Prostate Specific Ag, Serum: 2.2 ng/mL (ref 0.0–4.0)

## 2021-10-23 LAB — COMPREHENSIVE METABOLIC PANEL
ALT: 40 IU/L (ref 0–44)
AST: 50 IU/L — ABNORMAL HIGH (ref 0–40)
Albumin/Globulin Ratio: 2.1 (ref 1.2–2.2)
Albumin: 5 g/dL — ABNORMAL HIGH (ref 3.8–4.8)
Alkaline Phosphatase: 78 IU/L (ref 44–121)
BUN/Creatinine Ratio: 17 (ref 10–24)
BUN: 14 mg/dL (ref 8–27)
Bilirubin Total: 0.6 mg/dL (ref 0.0–1.2)
CO2: 24 mmol/L (ref 20–29)
Calcium: 10 mg/dL (ref 8.6–10.2)
Chloride: 105 mmol/L (ref 96–106)
Creatinine, Ser: 0.81 mg/dL (ref 0.76–1.27)
Globulin, Total: 2.4 g/dL (ref 1.5–4.5)
Glucose: 123 mg/dL — ABNORMAL HIGH (ref 70–99)
Potassium: 3.9 mmol/L (ref 3.5–5.2)
Sodium: 145 mmol/L — ABNORMAL HIGH (ref 134–144)
Total Protein: 7.4 g/dL (ref 6.0–8.5)
eGFR: 95 mL/min/{1.73_m2} (ref >59)

## 2021-10-23 LAB — HEMOGLOBIN A1C
Est. average glucose Bld gHb Est-mCnc: 108 mg/dL
Hgb A1c MFr Bld: 5.4 % (ref 4.8–5.6)

## 2021-10-24 ENCOUNTER — Encounter (HOSPITAL_BASED_OUTPATIENT_CLINIC_OR_DEPARTMENT_OTHER): Payer: Self-pay | Admitting: Nurse Practitioner

## 2021-10-30 ENCOUNTER — Other Ambulatory Visit: Payer: Self-pay | Admitting: Nurse Practitioner

## 2021-10-30 ENCOUNTER — Other Ambulatory Visit (HOSPITAL_BASED_OUTPATIENT_CLINIC_OR_DEPARTMENT_OTHER): Payer: Self-pay

## 2021-10-30 DIAGNOSIS — I10 Essential (primary) hypertension: Secondary | ICD-10-CM

## 2021-10-30 DIAGNOSIS — F418 Other specified anxiety disorders: Secondary | ICD-10-CM

## 2021-10-30 MED ORDER — BUSPIRONE HCL 7.5 MG PO TABS
7.5000 mg | ORAL_TABLET | Freq: Three times a day (TID) | ORAL | 0 refills | Status: DC
  Filled 2021-10-30 – 2021-11-18 (×2): qty 270, 90d supply, fill #0

## 2021-10-30 MED ORDER — BISOPROLOL FUMARATE 5 MG PO TABS: 2.5000 mg | ORAL_TABLET | Freq: Every day | ORAL | 3 refills | Status: DC

## 2021-10-30 MED ORDER — ROSUVASTATIN CALCIUM 20 MG PO TABS
20.0000 mg | ORAL_TABLET | Freq: Every day | ORAL | 0 refills | Status: DC
  Filled 2021-10-30 – 2022-01-12 (×2): qty 90, 90d supply, fill #0

## 2021-11-14 ENCOUNTER — Other Ambulatory Visit (HOSPITAL_BASED_OUTPATIENT_CLINIC_OR_DEPARTMENT_OTHER): Payer: Self-pay

## 2021-11-18 ENCOUNTER — Other Ambulatory Visit (HOSPITAL_BASED_OUTPATIENT_CLINIC_OR_DEPARTMENT_OTHER): Payer: Self-pay

## 2021-12-16 ENCOUNTER — Encounter (HOSPITAL_BASED_OUTPATIENT_CLINIC_OR_DEPARTMENT_OTHER): Payer: Self-pay | Admitting: Nurse Practitioner

## 2022-01-02 ENCOUNTER — Other Ambulatory Visit (HOSPITAL_BASED_OUTPATIENT_CLINIC_OR_DEPARTMENT_OTHER): Payer: Self-pay

## 2022-01-06 ENCOUNTER — Other Ambulatory Visit (HOSPITAL_BASED_OUTPATIENT_CLINIC_OR_DEPARTMENT_OTHER): Payer: Self-pay

## 2022-01-06 DIAGNOSIS — H2513 Age-related nuclear cataract, bilateral: Secondary | ICD-10-CM | POA: Diagnosis not present

## 2022-01-06 DIAGNOSIS — D3132 Benign neoplasm of left choroid: Secondary | ICD-10-CM | POA: Diagnosis not present

## 2022-01-06 DIAGNOSIS — H52203 Unspecified astigmatism, bilateral: Secondary | ICD-10-CM | POA: Diagnosis not present

## 2022-01-10 ENCOUNTER — Other Ambulatory Visit: Payer: Self-pay | Admitting: Family Medicine

## 2022-01-12 ENCOUNTER — Other Ambulatory Visit (HOSPITAL_BASED_OUTPATIENT_CLINIC_OR_DEPARTMENT_OTHER): Payer: Self-pay

## 2022-01-13 ENCOUNTER — Other Ambulatory Visit (HOSPITAL_BASED_OUTPATIENT_CLINIC_OR_DEPARTMENT_OTHER): Payer: Self-pay | Admitting: Nurse Practitioner

## 2022-01-13 ENCOUNTER — Other Ambulatory Visit: Payer: Self-pay | Admitting: Family Medicine

## 2022-01-13 DIAGNOSIS — G4700 Insomnia, unspecified: Secondary | ICD-10-CM

## 2022-01-14 ENCOUNTER — Other Ambulatory Visit (HOSPITAL_BASED_OUTPATIENT_CLINIC_OR_DEPARTMENT_OTHER): Payer: Self-pay

## 2022-01-14 ENCOUNTER — Other Ambulatory Visit (HOSPITAL_BASED_OUTPATIENT_CLINIC_OR_DEPARTMENT_OTHER): Payer: Self-pay | Admitting: Nurse Practitioner

## 2022-01-14 MED ORDER — BISOPROLOL FUMARATE 5 MG PO TABS
2.5000 mg | ORAL_TABLET | Freq: Every day | ORAL | 3 refills | Status: DC
  Filled 2022-01-14: qty 45, 90d supply, fill #0
  Filled 2022-04-14: qty 45, 90d supply, fill #1
  Filled 2022-07-10: qty 45, 90d supply, fill #2
  Filled 2022-11-13: qty 45, 90d supply, fill #3

## 2022-01-14 MED ORDER — ZOLPIDEM TARTRATE 5 MG PO TABS
5.0000 mg | ORAL_TABLET | Freq: Every evening | ORAL | 0 refills | Status: DC | PRN
  Filled 2022-01-14: qty 30, 30d supply, fill #0

## 2022-01-15 ENCOUNTER — Other Ambulatory Visit (HOSPITAL_BASED_OUTPATIENT_CLINIC_OR_DEPARTMENT_OTHER): Payer: Self-pay

## 2022-02-10 ENCOUNTER — Other Ambulatory Visit (HOSPITAL_BASED_OUTPATIENT_CLINIC_OR_DEPARTMENT_OTHER): Payer: Self-pay

## 2022-02-10 MED FILL — Amlodipine Besylate Tab 10 MG (Base Equivalent): ORAL | 90 days supply | Qty: 90 | Fill #0 | Status: AC

## 2022-02-13 ENCOUNTER — Encounter (HOSPITAL_BASED_OUTPATIENT_CLINIC_OR_DEPARTMENT_OTHER): Payer: Self-pay

## 2022-02-13 ENCOUNTER — Ambulatory Visit (INDEPENDENT_AMBULATORY_CARE_PROVIDER_SITE_OTHER): Payer: Medicare Other

## 2022-02-13 ENCOUNTER — Telehealth: Payer: Self-pay

## 2022-02-13 DIAGNOSIS — Z0000 Encounter for general adult medical examination without abnormal findings: Secondary | ICD-10-CM | POA: Diagnosis not present

## 2022-02-13 NOTE — Telephone Encounter (Signed)
Attempted to call pt to perform AWV. I left him a vm to call the office YL,RMA ?

## 2022-02-13 NOTE — Progress Notes (Signed)
? ?Subjective:  ? Clinton Newton is a 71 y.o. male who presents for an Initial Medicare Annual Wellness Visit. ?I connected with  Birder Robson on 02/13/22 by a audio enabled telemedicine application and verified that I am speaking with the correct person using two identifiers. ? ?Patient Location: Home ? ?Provider Location: Home Office ? ?I discussed the limitations of evaluation and management by telemedicine. The patient expressed understanding and agreed to proceed.  ? ?   ?Objective:  ?  ?There were no vitals filed for this visit. ?There is no height or weight on file to calculate BMI. ? ? ?  11/09/2020  ?  3:15 PM 02/22/2020  ?  2:00 PM 11/09/2017  ?  8:34 AM 02/05/2017  ?  2:16 PM 12/22/2016  ?  2:07 PM 12/01/2016  ?  3:51 PM 11/20/2016  ?  2:11 PM  ?Advanced Directives  ?Does Patient Have a Medical Advance Directive? No Yes Yes Yes No Yes Yes  ?Type of Advance Directive  Living will;Healthcare Power of [place];Living will  [place];Living will [place];Living will  ?Does patient want to make changes to medical advance directive?  No - Patient declined    No - Patient declined   ?Copy of [place] in Chart?  No - copy requested  No - copy requested  No - copy requested No - copy requested  ?Would patient like information on creating a medical advance directive? No - Patient declined        ? ? ?Current Medications (verified) ?Outpatient Encounter Medications as of 02/13/2022  ?Medication Sig  ? amLODipine (NORVASC) 10 MG tablet TAKE 1 TABLET BY MOUTH EVERY DAY  ? ascorbic acid (VITAMIN C) 1000 MG tablet Take 1,000 mg by mouth daily.  ? bisoprolol (ZEBETA) 5 MG tablet Take 1/2 tablet (2.5 mg total) by mouth daily.  ? busPIRone (BUSPAR) 7.5 MG tablet TAKE 1 TABLET (7.5 MG TOTAL) BY MOUTH 3 (THREE) TIMES DAILY.  ? cholecalciferol (VITAMIN D) 1000 UNITS tablet Take 1,000 Units by mouth daily.  ? Flaxseed,  Linseed, (FLAXSEED OIL PO) Take by mouth as directed.  ? methocarbamol (ROBAXIN-750) 750 MG tablet Take 1 tablet (750 mg total) by mouth 3 (three) times daily.  ? Misc Natural Products (BETA-SITOSTEROL PLANT STEROLS PO)   ? Multiple Vitamin (MULTIVITAMIN) tablet Take 1 tablet by mouth daily.  ? Omega-3 Fatty Acids (FISH OIL PO) Take by mouth daily.  ? Probiotic Product (PROBIOTIC PO) Take by mouth daily.  ? rosuvastatin (CRESTOR) 20 MG tablet TAKE 1 TABLET BY MOUTH EVERY DAY  ? Saw Palmetto 500 MG CAPS   ? vitamin B-12 (CYANOCOBALAMIN) 1000 MCG tablet Take 1,000 mcg by mouth daily.  ? zolpidem (AMBIEN) 5 MG tablet Take 1 tablet (5 mg total) by mouth at bedtime as needed. for sleep  ? ?No facility-administered encounter medications on file as of 02/13/2022.  ? ? ?Allergies (verified) ?Patient has no known allergies.  ? ?History: ?Past Medical History:  ?Diagnosis Date  ? Arthritis   ? L3-L4  ? Cataract   ? Colon polyps   ? Diverticulitis   ? GERD (gastroesophageal reflux disease)   ? Hyperlipidemia   ? Hypertension   ? ?Past Surgical History:  ?Procedure Laterality Date  ? APPENDECTOMY    ? COLONOSCOPY    ? POLYPECTOMY    ? PRE-MALIGNANT / BENIGN SKIN LESION EXCISION    ? ?Family History  ?  Problem Relation Age of Onset  ? Heart disease Father   ? Colon cancer Neg Hx   ? Pancreatic cancer Neg Hx   ? Rectal cancer Neg Hx   ? Stomach cancer Neg Hx   ? Esophageal cancer Neg Hx   ? ?Social History  ? ?Socioeconomic History  ? Marital status: Married  ?  Spouse name: Not on file  ? Number of children: 0  ? Years of education: Not on file  ? Highest education level: Not on file  ?Occupational History  ? Occupation: Retired  ?Tobacco Use  ? Smoking status: Never  ? Smokeless tobacco: Never  ?Vaping Use  ? Vaping Use: Never used  ?Substance and Sexual Activity  ? Alcohol use: Yes  ?  Alcohol/week: 7.0 standard drinks  ?  Types: 7 Glasses of wine per week  ? Drug use: No  ? Sexual activity: Yes  ?Other Topics Concern  ? Not on  file  ?Social History Narrative  ? 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   ? Home Situation: lives with wife  ?   ? Lifestyle: working out on a regular basis; diet is healthy  ?   ?   ? ?Social Determinants of Health  ? ?Financial Resource Strain: Low Risk   ? Difficulty of Paying Living Expenses: Not hard at all  ?Food Insecurity: No Food Insecurity  ? Worried About Charity fundraiser in the Last Year: Never true  ? Ran Out of Food in the Last Year: Never true  ?Transportation Needs: No Transportation Needs  ? Lack of Transportation (Medical): No  ? Lack of Transportation (Non-Medical): No  ?Physical Activity: Sufficiently Active  ? Days of Exercise per Week: 4 days  ? Minutes of Exercise per Session: 50 min  ?Stress: No Stress Concern Present  ? Feeling of Stress : Not at all  ?Social Connections: Moderately Isolated  ? Frequency of Communication with Friends and Family: Never  ? Frequency of Social Gatherings with Friends and Family: More than three times a week  ? Attends Religious Services: Never  ? Active Member of Clubs or Organizations: No  ? Attends Archivist Meetings: Never  ? Marital Status: Married  ? ? ?Tobacco Counseling ?Counseling given: Not Answered ? ? ?Clinical Intake: ? ?Pre-visit preparation completed: Yes ? ?Pain : No/denies pain ? ?  ? ?Diabetes: No ? ?How often do you need to have someone help you when you read instructions, pamphlets, or other written materials from your doctor or pharmacy?: 1 - Never ? ?Diabetic?no ? ?Interpreter Needed?: No ? ?  ? ? ?Activities of Daily Living ? ?  02/13/2022  ?  2:39 PM 02/12/2022  ? 10:05 AM  ?In your present state of health, do you have any difficulty performing the following activities:  ?Hearing? 0 0  ?Vision? 0 0  ?Difficulty concentrating or making decisions? 0 0  ?Walking or climbing stairs? 0 0  ?Dressing or bathing? 0 0  ?Doing errands, shopping? 0 0  ?Preparing  Food and eating ?  N  ?Using the Toilet?  N  ?In the past six months, have you accidently leaked urine?  N  ?Do you have problems with loss of bowel control?  N  ?Managing your Medications?  N  ?Managing your Finances?  N  ?Housekeeping or managing your Housekeeping?  N  ? ? ?Patient Care Team: ?Jacolyn Reedy  E, NP as PCP - General (Nurse Practitioner) ?Irine Seal, MD as Attending Physician (Urology) ?Gaynelle Arabian, MD as Consulting Physician (Orthopedic Surgery) ?Crista Luria, MD as Consulting Physician (Dermatology) ?Ladene Artist, MD as Consulting Physician (Gastroenterology) ?Luberta Mutter, MD as Consulting Physician (Ophthalmology) ? ?Indicate any recent Medical Services you may have received from other than [facility] providers in the past year (date may be approximate). ? ?   ?Assessment:  ? This is a routine wellness examination for Clinton Newton. ? ?Hearing/Vision screen ?No results found. ? ?Dietary issues and exercise activities discussed: ?  ? ? Goals Addressed   ?None ?  ?Depression Screen ? ?  02/13/2022  ?  2:35 PM 05/09/2021  ? 10:55 AM 07/24/2020  ?  8:23 AM 02/22/2020  ?  2:01 PM 12/25/2019  ? 10:47 AM 06/23/2019  ? 10:09 AM 09/28/2018  ?  1:05 PM  ?PHQ 2/9 Scores  ?PHQ - 2 Score 0 0 0 0 0 3 0  ?PHQ- 9 Score     0 4   ?  ?Fall Risk ? ?  02/13/2022  ?  2:39 PM 02/12/2022  ? 10:05 AM 05/09/2021  ? 10:56 AM 01/27/2021  ?  1:54 PM 10/04/2020  ? 10:18 AM  ?Fall Risk   ?Falls in the past year? 0 0 1 0 0  ?Number falls in past yr: 0 0 0    ?Injury with Fall? 0 0 0    ?Risk for fall due to : No Fall Risks    No Fall Risks  ?Follow up Falls evaluation completed      ? ? ?FALL RISK PREVENTION PERTAINING TO THE HOME: ? ?Any stairs in or around the home? Yes  ?If so, are there any without handrails? No  ?Home free of loose throw rugs in walkways, pet beds, electrical cords, etc? Yes  ?Adequate lighting in your home to reduce risk of falls? Yes  ? ?ASSISTIVE DEVICES UTILIZED TO PREVENT FALLS: ? ?Life alert? No  ?Use of a cane,  walker or w/c? No  ?Grab bars in the bathroom? No  ?Shower chair or bench in shower? Yes  ?Elevated toilet seat or a handicapped toilet? No  ? ?Cognitive Function: ?  ?  ? ?  02/13/2022  ?  2:40 PM 02/22/19

## 2022-02-13 NOTE — Patient Instructions (Signed)
Health Maintenance, Male Adopting a healthy lifestyle and getting preventive care are important in promoting health and wellness. Ask your health care provider about: The right schedule for you to have regular tests and exams. Things you can do on your own to prevent diseases and keep yourself healthy. What should I know about diet, weight, and exercise? Eat a healthy diet  Eat a diet that includes plenty of vegetables, fruits, low-fat dairy products, and lean protein. Do not eat a lot of foods that are high in solid fats, added sugars, or sodium. Maintain a healthy weight Body mass index (BMI) is a measurement that can be used to identify possible weight problems. It estimates body fat based on height and weight. Your health care provider can help determine your BMI and help you achieve or maintain a healthy weight. Get regular exercise Get regular exercise. This is one of the most important things you can do for your health. Most adults should: Exercise for at least 150 minutes each week. The exercise should increase your heart rate and make you sweat (moderate-intensity exercise). Do strengthening exercises at least twice a week. This is in addition to the moderate-intensity exercise. Spend less time sitting. Even light physical activity can be beneficial. Watch cholesterol and blood lipids Have your blood tested for lipids and cholesterol at 70 years of age, then have this test every 5 years. You may need to have your cholesterol levels checked more often if: Your lipid or cholesterol levels are high. You are older than 70 years of age. You are at high risk for heart disease. What should I know about cancer screening? Many types of cancers can be detected early and may often be prevented. Depending on your health history and family history, you may need to have cancer screening at various ages. This may include screening for: Colorectal cancer. Prostate cancer. Skin cancer. Lung  cancer. What should I know about heart disease, diabetes, and high blood pressure? Blood pressure and heart disease High blood pressure causes heart disease and increases the risk of stroke. This is more likely to develop in people who have high blood pressure readings or are overweight. Talk with your health care provider about your target blood pressure readings. Have your blood pressure checked: Every 3-5 years if you are 18-39 years of age. Every year if you are 40 years old or older. If you are between the ages of 65 and 75 and are a current or former smoker, ask your health care provider if you should have a one-time screening for abdominal aortic aneurysm (AAA). Diabetes Have regular diabetes screenings. This checks your fasting blood sugar level. Have the screening done: Once every three years after age 45 if you are at a normal weight and have a low risk for diabetes. More often and at a younger age if you are overweight or have a high risk for diabetes. What should I know about preventing infection? Hepatitis B If you have a higher risk for hepatitis B, you should be screened for this virus. Talk with your health care provider to find out if you are at risk for hepatitis B infection. Hepatitis C Blood testing is recommended for: Everyone born from 1945 through 1965. Anyone with known risk factors for hepatitis C. Sexually transmitted infections (STIs) You should be screened each year for STIs, including gonorrhea and chlamydia, if: You are sexually active and are younger than 70 years of age. You are older than 70 years of age and your   health care provider tells you that you are at risk for this type of infection. Your sexual activity has changed since you were last screened, and you are at increased risk for chlamydia or gonorrhea. Ask your health care provider if you are at risk. Ask your health care provider about whether you are at high risk for HIV. Your health care provider  may recommend a prescription medicine to help prevent HIV infection. If you choose to take medicine to prevent HIV, you should first get tested for HIV. You should then be tested every 3 months for as long as you are taking the medicine. Follow these instructions at home: Alcohol use Do not drink alcohol if your health care provider tells you not to drink. If you drink alcohol: Limit how much you have to 0-2 drinks a day. Know how much alcohol is in your drink. In the [place] one drink equals one 12 oz bottle of beer (355 mL), one 5 oz glass of wine (148 mL), or one 1 oz glass of hard liquor (44 mL). Lifestyle Do not use any products that contain nicotine or tobacco. These products include cigarettes, chewing tobacco, and vaping devices, such as e-cigarettes. If you need help quitting, ask your health care provider. Do not use street drugs. Do not share needles. Ask your health care provider for help if you need support or information about quitting drugs. General instructions Schedule regular health, dental, and eye exams. Stay current with your vaccines. Tell your health care provider if: You often feel depressed. You have ever been abused or do not feel safe at home. Summary Adopting a healthy lifestyle and getting preventive care are important in promoting health and wellness. Follow your health care provider's instructions about healthy diet, exercising, and getting tested or screened for diseases. Follow your health care provider's instructions on monitoring your cholesterol and blood pressure. This information is not intended to replace advice given to you by your health care provider. Make sure you discuss any questions you have with your health care provider. Document Revised: 03/03/2021 Document Reviewed: 03/03/2021 Elsevier Patient Education  2023 [employer]  

## 2022-02-18 ENCOUNTER — Ambulatory Visit (INDEPENDENT_AMBULATORY_CARE_PROVIDER_SITE_OTHER): Payer: Medicare Other | Admitting: Family Medicine

## 2022-02-18 ENCOUNTER — Encounter (HOSPITAL_BASED_OUTPATIENT_CLINIC_OR_DEPARTMENT_OTHER): Payer: Self-pay | Admitting: Family Medicine

## 2022-02-18 DIAGNOSIS — M9262 Juvenile osteochondrosis of tarsus, left ankle: Secondary | ICD-10-CM | POA: Insufficient documentation

## 2022-02-18 DIAGNOSIS — M7660 Achilles tendinitis, unspecified leg: Secondary | ICD-10-CM | POA: Diagnosis not present

## 2022-02-18 DIAGNOSIS — M722 Plantar fascial fibromatosis: Secondary | ICD-10-CM | POA: Diagnosis not present

## 2022-02-18 NOTE — Patient Instructions (Addendum)
Plantar Fasciitis Rehab ?Ask your health care provider which exercises are safe for you. Do exercises exactly as told by your health care provider and adjust them as directed. It is normal to feel mild stretching, pulling, tightness, or discomfort as you do these exercises. Stop right away if you feel sudden pain or your pain gets worse. Do not begin these exercises until told by your health care provider. ?Can consider use of Tuli's Heel Cups ?Stretching and range-of-motion exercises ?These exercises warm up your muscles and joints and improve the movement and flexibility of your foot. These exercises also help to relieve pain. ?Plantar fascia stretch ? ?Sit with your left / right leg crossed over your opposite knee. ?Hold your heel with one hand with that thumb near your arch. With your other hand, hold your toes and gently pull them back toward the top of your foot. You should feel a stretch on the base (bottom) of your toes, or the bottom of your foot (plantar fascia), or both. ?Hold this stretch for__________ seconds. ?Slowly release your toes and return to the starting position. ?Repeat __________ times. Complete this exercise __________ times a day. ?Gastrocnemius stretch, standing ?This exercise is also called a calf (gastroc) stretch. It stretches the muscles in the back of the upper calf. ?Stand with your hands against a wall. ?Extend your left / right leg behind you, and bend your front knee slightly. ?Keeping your heels on the floor, your toes facing forward, and your back knee straight, shift your weight toward the wall. Do not arch your back. You should feel a gentle stretch in your upper calf. ?Hold this position for __________ seconds. ?Repeat __________ times. Complete this exercise __________ times a day. ?Soleus stretch, standing ?This exercise is also called a calf (soleus) stretch. It stretches the muscles in the back of the lower calf. ?Stand with your hands against a wall. ?Extend your left /  right leg behind you, and bend your front knee slightly. ?Keeping your heels on the floor and your toes facing forward, bend your back knee and shift your weight slightly over your back leg. You should feel a gentle stretch deep in your lower calf. ?Hold this position for __________ seconds. ?Repeat __________ times. Complete this exercise __________ times a day. ?Gastroc and soleus stretch, standing step ?This exercise stretches the muscles in the back of the lower leg. These muscles are in the upper calf (gastrocnemius) and the lower calf (soleus). ?Stand with the ball of your left / right foot on the front of a step. The ball of your foot is on the walking surface, right under your toes. ?Keep your other foot firmly on the same step. ?Hold on to the wall or a railing for balance. ?Slowly lift your other foot, allowing your body weight to press your heel down over the edge of the front of the step. Keep knee straight and unbent. You should feel a stretch in your calf. ?Hold this position for __________ seconds. ?Return both feet to the step. ?Repeat this exercise with a slight bend in your left / right knee. ?Repeat __________ times with your left / right knee straight and __________ times with your left / right knee bent. Complete this exercise __________ times a day. ?Balance exercise ?This exercise builds your balance and strength control of your arch to help take pressure off your plantar fascia. ?Single leg stand ?If this exercise is too easy, you can try it with your eyes closed or while standing on  a pillow. ?Without shoes, stand near a railing or in a doorway. You may hold on to the railing or door frame as needed. ?Stand on your left / right foot. Keep your big toe down on the floor and lift the arch of your foot. You should feel a stretch across the bottom of your foot and your arch. Do not let your foot roll inward. ?Hold this position for __________ seconds. ?Repeat __________ times. Complete this  exercise __________ times a day. ?This information is not intended to replace advice given to you by your health care provider. Make sure you discuss any questions you have with your health care provider. ?Document Revised: 07/25/2020 Document Reviewed: 07/25/2020 ?Elsevier Patient Education ? [place]. ? ?

## 2022-02-18 NOTE — Assessment & Plan Note (Signed)
Patient presents for evaluation of left heel pain.  He has noticed that over the past few weeks he has been having fairly regular heel pain which will be over both posterior aspect of left heel and plantar aspect of heel.  He will notice pain in both areas typically with walking.  He also feels that there will be pain when using stairs, particularly when going downstairs.  He has not had any associated numbness or tingling.  Does have some increased prominence of her posterior heel, however no swelling otherwise noted.  Pain does feel better when utilizing shoes with more arch support. ?On exam, patient with normal passive and active range of motion at left ankle.  Normal strength for flexion, extension, inversion, eversion.  No pain with these movements.  Does have mild tenderness at Achilles tendon insertion with some increased bony prominence in this area as well.  No significant tenderness to palpation at plantar fascia insertion.  Negative windlass test.  Neurovascular exam is intact ?Feel current symptoms are related to 2 separate processes including insertional Achilles tendinopathy with Haglund's deformity present as well as some component of plantars fasciitis. ?Discussed treatment recommendations, recommend proceeding with conservative measures including home exercise program, physical therapy, discussed use of icing, particularly using frozen water bottle for plantar fasciitis.  Did provide handout today with home exercises which can be utilized, likely physical therapy will also provide some home exercises for patient to utilize ?Also discussed possibility of using Tuli's heel cups, discussed how patient can obtain these ?Plan for follow-up in about 6 to 8 weeks to monitor progress, if not improving as expected, consider imaging ?

## 2022-02-18 NOTE — Progress Notes (Signed)
? ? ?  Procedures performed today:   ? ?None. ? ?Independent interpretation of notes and tests performed by another provider:  ? ?None. ? ?Brief History, Exam, Impression, and Recommendations:   ? ?BP 133/65   Pulse (!) 55   Temp 97.7 ?F (36.5 ?C)   Ht '5\' 8"'$  (1.727 m)   Wt 189 lb 14.4 oz (86.1 kg)   SpO2 98%   BMI 28.87 kg/m?  ? ?Insertional Achilles tendinopathy ?Patient presents for evaluation of left heel pain.  He has noticed that over the past few weeks he has been having fairly regular heel pain which will be over both posterior aspect of left heel and plantar aspect of heel.  He will notice pain in both areas typically with walking.  He also feels that there will be pain when using stairs, particularly when going downstairs.  He has not had any associated numbness or tingling.  Does have some increased prominence of her posterior heel, however no swelling otherwise noted.  Pain does feel better when utilizing shoes with more arch support. ?On exam, patient with normal passive and active range of motion at left ankle.  Normal strength for flexion, extension, inversion, eversion.  No pain with these movements.  Does have mild tenderness at Achilles tendon insertion with some increased bony prominence in this area as well.  No significant tenderness to palpation at plantar fascia insertion.  Negative windlass test.  Neurovascular exam is intact ?Feel current symptoms are related to 2 separate processes including insertional Achilles tendinopathy with Haglund's deformity present as well as some component of plantars fasciitis. ?Discussed treatment recommendations, recommend proceeding with conservative measures including home exercise program, physical therapy, discussed use of icing, particularly using frozen water bottle for plantar fasciitis.  Did provide handout today with home exercises which can be utilized, likely physical therapy will also provide some home exercises for patient to utilize ?Also  discussed possibility of using Tuli's heel cups, discussed how patient can obtain these ?Plan for follow-up in about 6 to 8 weeks to monitor progress, if not improving as expected, consider imaging ? ?Plantar fasciitis of left foot ?See above regarding history and exam as well as recommended management ? ? ?___________________________________________ ?Clinton Bertoni de [place], MD, ABFM, CAQSM ?Primary Care and Sports Medicine ?[facility] ?

## 2022-02-18 NOTE — Assessment & Plan Note (Signed)
See above regarding history and exam as well as recommended management ?

## 2022-02-19 DIAGNOSIS — Z23 Encounter for immunization: Secondary | ICD-10-CM | POA: Diagnosis not present

## 2022-03-30 ENCOUNTER — Encounter: Payer: Self-pay | Admitting: Family Medicine

## 2022-04-01 ENCOUNTER — Ambulatory Visit (HOSPITAL_BASED_OUTPATIENT_CLINIC_OR_DEPARTMENT_OTHER): Payer: Medicare Other | Admitting: Family Medicine

## 2022-04-03 ENCOUNTER — Ambulatory Visit: Payer: Medicare Other

## 2022-04-03 ENCOUNTER — Ambulatory Visit: Payer: Medicare Other | Admitting: Family Medicine

## 2022-04-03 ENCOUNTER — Telehealth: Payer: Self-pay

## 2022-04-03 NOTE — Telephone Encounter (Signed)
Pt was at the office on 04/03/22 for schedule visit to start gel shots. [employer] was approved, however pt had a new Buyer, [employer]. The new card was sent to [employer] to re-confirm authorization. Pt will be called and r/s once we get re-approval.

## 2022-04-06 ENCOUNTER — Ambulatory Visit (INDEPENDENT_AMBULATORY_CARE_PROVIDER_SITE_OTHER): Payer: Medicare Other | Admitting: Family Medicine

## 2022-04-06 ENCOUNTER — Ambulatory Visit: Payer: Medicare Other

## 2022-04-06 ENCOUNTER — Encounter (HOSPITAL_BASED_OUTPATIENT_CLINIC_OR_DEPARTMENT_OTHER): Payer: Self-pay | Admitting: Family Medicine

## 2022-04-06 VITALS — BP 154/81 | HR 94 | Ht 68.0 in | Wt 191.8 lb

## 2022-04-06 DIAGNOSIS — M25551 Pain in right hip: Secondary | ICD-10-CM | POA: Insufficient documentation

## 2022-04-06 DIAGNOSIS — G8929 Other chronic pain: Secondary | ICD-10-CM | POA: Diagnosis not present

## 2022-04-06 DIAGNOSIS — M25561 Pain in right knee: Secondary | ICD-10-CM | POA: Diagnosis not present

## 2022-04-06 DIAGNOSIS — M1711 Unilateral primary osteoarthritis, right knee: Secondary | ICD-10-CM | POA: Diagnosis not present

## 2022-04-06 DIAGNOSIS — M722 Plantar fascial fibromatosis: Secondary | ICD-10-CM

## 2022-04-06 DIAGNOSIS — M7660 Achilles tendinitis, unspecified leg: Secondary | ICD-10-CM

## 2022-04-06 NOTE — Patient Instructions (Addendum)
Good to see you today.  You had a R knee Orthovisc injection.  Call or go to the ER if you develop a large red swollen joint with extreme pain or oozing puss.   Schedule 2 follow-up appointments (1/week for the next 2 weeks) to finish your Orthovisc seried.

## 2022-04-06 NOTE — Progress Notes (Signed)
Clinton Newton presents to clinic today for Orthovisc injection right knee 1/3  Procedure: Real-time Ultrasound Guided Injection of right knee superior lateral patellar space Device: Philips Affiniti 50G Images permanently stored and available for review in PACS Verbal informed consent obtained Discussed risks and benefits of procedure. Warned about infection, bleeding, damage to structures among others. Patient expresses understanding and agreement Time-out conducted.   Noted no overlying erythema, induration, or other signs of local infection.   Skin prepped in a sterile fashion.   Local anesthesia: Topical Ethyl chloride.   With sterile technique and under real time ultrasound guidance: Orthovisc 30 mg injected into knee joint. Fluid seen entering the joint capsule.   Completed without difficulty     Advised to call if fevers/chills, erythema, induration, drainage, or persistent bleeding.   Images permanently stored and available for review in the ultrasound unit.  Impression: Technically successful ultrasound guided injection.   Lot number: [id number]  Return in 1 week for Orthovisc injection right knee 2/3

## 2022-04-06 NOTE — Assessment & Plan Note (Signed)
Symptoms have generally improved in regards to posterior heel pain.  Still has a bump present, however symptoms are significantly improved.

## 2022-04-06 NOTE — Assessment & Plan Note (Addendum)
He does report ongoing issues with right hip pain which is a somewhat new issue for patient.  Pain is primarily over the outside of hip.  Worsened when laying on right side when sleeping.  Some worsening with increased activity.  Has had chronic intermittent right knee pain, denies any significant right hip issues in the past.  No prior right hip imaging. On exam, patient with notable tenderness over right greater trochanter which reproduces symptoms.  No significant tenderness to palpation along IT band.  Patient with normal range of motion of right hip, negative FABER and FADIR, negative logroll.  Patient with good strength for hip abduction and adduction without any significant pain. 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  He would prefer to proceed with conservative measures including OTC medications, home exercises.  He would consider physical therapy as well, however would prefer to start with home exercises and proceed accordingly.  May also be interested in local steroid injection, however would prefer initial interventions as above and then consider steroid injection if symptoms persist.  Feel that this is reasonable Provided with handout today reviewing diagnosis as well as recommended exercises We will plan for follow-up in about 4 to 6 weeks to assess progress.  If patient would like referral to physical therapy prior to this, advised to contact the office.  If patient notices any worsening, recommend contacting us and we can proceed with consideration for imaging, steroid injection

## 2022-04-06 NOTE — Progress Notes (Signed)
    Procedures performed today:    None.  Independent interpretation of notes and tests performed by another provider:   None.  Brief History, Exam, Impression, and Recommendations:    BP (!) 154/81   Pulse 94   Ht '5\' 8"'$  (1.727 m)   Wt 191 lb 12.8 oz (87 kg)   SpO2 97%   BMI 29.16 kg/m   Plantar fasciitis of left foot Patient reports that symptoms related to plantar fasciitis are much improved since last visit.  He has mostly noticed improvement with use of heel cups as well as change in footwear.  He is also doing home exercises which have been very helpful.  Generally feels that symptoms are doing well and does not have any concerns today regarding this  Insertional Achilles tendinopathy Symptoms have generally improved in regards to posterior heel pain.  Still has a bump present, however symptoms are significantly improved.  Right hip pain He does report ongoing issues with right hip pain which is a somewhat new issue for patient.  Pain is primarily over the outside of hip.  Worsened when laying on right side when sleeping.  Some worsening with increased activity.  Has had chronic intermittent right knee pain, denies any significant right hip issues in the past.  No prior right hip imaging. On exam, patient with notable tenderness over right greater trochanter which reproduces symptoms.  No significant tenderness to palpation along IT band.  Patient with normal range of motion of right hip, negative FABER and FADIR, negative logroll.  Patient with good strength for hip abduction and adduction without any significant pain. 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  He would prefer to proceed with conservative measures including OTC medications, home exercises.  He would consider physical therapy as well, however would prefer to start with home exercises and proceed accordingly.  May also be interested in local steroid injection, however would prefer  initial interventions as above and then consider steroid injection if symptoms persist.  Feel that this is reasonable Provided with handout today reviewing diagnosis as well as recommended exercises We will plan for follow-up in about 4 to 6 weeks to assess progress.  If patient would like referral to physical therapy prior to this, advised to contact the office.  If patient notices any worsening, recommend contacting us and we can proceed with consideration for imaging, steroid injection  Return in about 6 weeks (around 05/18/2022) for Right hip pain.   ___________________________________________ Clinton Kaufhold de [place], MD, ABFM, [facility] and [place]

## 2022-04-06 NOTE — Assessment & Plan Note (Signed)
Patient reports that symptoms related to plantar fasciitis are much improved since last visit.  He has mostly noticed improvement with use of heel cups as well as change in footwear.  He is also doing home exercises which have been very helpful.  Generally feels that symptoms are doing well and does not have any concerns today regarding this

## 2022-04-08 ENCOUNTER — Encounter (HOSPITAL_BASED_OUTPATIENT_CLINIC_OR_DEPARTMENT_OTHER): Payer: Self-pay

## 2022-04-08 ENCOUNTER — Ambulatory Visit (HOSPITAL_BASED_OUTPATIENT_CLINIC_OR_DEPARTMENT_OTHER): Payer: Medicare Other | Admitting: Physical Therapy

## 2022-04-13 ENCOUNTER — Ambulatory Visit (INDEPENDENT_AMBULATORY_CARE_PROVIDER_SITE_OTHER): Payer: Medicare Other | Admitting: Family Medicine

## 2022-04-13 ENCOUNTER — Ambulatory Visit: Payer: Self-pay

## 2022-04-13 DIAGNOSIS — M25561 Pain in right knee: Secondary | ICD-10-CM | POA: Diagnosis not present

## 2022-04-13 DIAGNOSIS — G8929 Other chronic pain: Secondary | ICD-10-CM | POA: Diagnosis not present

## 2022-04-13 DIAGNOSIS — M1711 Unilateral primary osteoarthritis, right knee: Secondary | ICD-10-CM

## 2022-04-13 NOTE — Progress Notes (Signed)
Clinton Newton presents to clinic today for Orthovisc injection right knee 2/3  Procedure: Real-time Ultrasound Guided Injection of right knee superior lateral patellar space Device: Philips Affiniti 50G Images permanently stored and available for review in PACS Verbal informed consent obtained.  Discussed risks and benefits of procedure. Warned about infection, bleeding, damage to structures among others. Patient expresses understanding and agreement Time-out conducted.   Noted no overlying erythema, induration, or other signs of local infection.   Skin prepped in a sterile fashion.   Local anesthesia: Topical Ethyl chloride.   With sterile technique and under real time ultrasound guidance: Orthovisc 30 mg injected into knee joint. Fluid seen entering the joint capsule.   Completed without difficulty   Advised to call if fevers/chills, erythema, induration, drainage, or persistent bleeding.   Images permanently stored and available for review in the ultrasound unit.  Impression: Technically successful ultrasound guided injection.   Lot number: [id number]  Return in 1 week for Orthovisc injection right knee 3/3

## 2022-04-13 NOTE — Patient Instructions (Signed)
Thank you for coming in today.   You received an injection today. Seek immediate medical attention if the joint becomes red, extremely painful, or is oozing fluid.   We will see you next week for Orthovisc injection #3

## 2022-04-14 ENCOUNTER — Other Ambulatory Visit (HOSPITAL_BASED_OUTPATIENT_CLINIC_OR_DEPARTMENT_OTHER): Payer: Self-pay

## 2022-04-20 ENCOUNTER — Ambulatory Visit: Payer: Self-pay

## 2022-04-20 ENCOUNTER — Ambulatory Visit (INDEPENDENT_AMBULATORY_CARE_PROVIDER_SITE_OTHER): Payer: Medicare Other | Admitting: Family Medicine

## 2022-04-20 DIAGNOSIS — M25561 Pain in right knee: Secondary | ICD-10-CM | POA: Diagnosis not present

## 2022-04-20 DIAGNOSIS — M1711 Unilateral primary osteoarthritis, right knee: Secondary | ICD-10-CM | POA: Diagnosis not present

## 2022-04-20 DIAGNOSIS — G8929 Other chronic pain: Secondary | ICD-10-CM

## 2022-05-05 ENCOUNTER — Other Ambulatory Visit (HOSPITAL_BASED_OUTPATIENT_CLINIC_OR_DEPARTMENT_OTHER): Payer: Self-pay

## 2022-05-05 ENCOUNTER — Other Ambulatory Visit (HOSPITAL_BASED_OUTPATIENT_CLINIC_OR_DEPARTMENT_OTHER): Payer: Self-pay | Admitting: Nurse Practitioner

## 2022-05-05 ENCOUNTER — Other Ambulatory Visit: Payer: Self-pay | Admitting: Nurse Practitioner

## 2022-05-05 MED ORDER — AMLODIPINE BESYLATE 10 MG PO TABS
ORAL_TABLET | ORAL | 3 refills | Status: DC
  Filled 2022-05-05: qty 90, 90d supply, fill #0
  Filled 2022-08-05: qty 90, 90d supply, fill #1
  Filled 2022-11-13: qty 90, 90d supply, fill #2
  Filled 2023-02-06: qty 90, 90d supply, fill #3

## 2022-05-11 ENCOUNTER — Other Ambulatory Visit (HOSPITAL_BASED_OUTPATIENT_CLINIC_OR_DEPARTMENT_OTHER): Payer: Self-pay

## 2022-05-18 ENCOUNTER — Ambulatory Visit (INDEPENDENT_AMBULATORY_CARE_PROVIDER_SITE_OTHER): Payer: Medicare Other | Admitting: Family Medicine

## 2022-05-18 ENCOUNTER — Encounter (HOSPITAL_BASED_OUTPATIENT_CLINIC_OR_DEPARTMENT_OTHER): Payer: Self-pay | Admitting: Family Medicine

## 2022-05-18 VITALS — BP 139/76 | HR 75 | Ht 68.0 in | Wt 194.6 lb

## 2022-05-18 DIAGNOSIS — M25551 Pain in right hip: Secondary | ICD-10-CM | POA: Diagnosis not present

## 2022-05-18 NOTE — Assessment & Plan Note (Signed)
Patient presents for follow-up of right hip pain.  Reports that generally symptoms have been doing well since last visit with me.  He has been doing home exercises as instructed on handout provided previously.  Still has some mild discomfort but symptoms are generally limited, not affecting sleep what they were before.  Pain is primarily located over outside of right hip, no change in this. Discussed options with patient today, he would prefer to continue with home exercises and monitoring for now Again discussed possibility of local steroid injection, however given that he has been doing well, feel that it is reasonable to continue with conservative measures and monitoring for the time being Also discussed possibility of referral to physical therapy, can consider as needed in the future Plan for follow-up as needed, particularly if any new or worsening symptoms develop

## 2022-05-18 NOTE — Progress Notes (Signed)
    Procedures performed today:    None.  Independent interpretation of notes and tests performed by another provider:   None.  Brief History, Exam, Impression, and Recommendations:    BP 139/76   Pulse 75   Ht '5\' 8"'$  (1.727 m)   Wt 194 lb 9.6 oz (88.3 kg)   SpO2 98%   BMI 29.59 kg/m   Right hip pain Patient presents for follow-up of right hip pain.  Reports that generally symptoms have been doing well since last visit with me.  He has been doing home exercises as instructed on handout provided previously.  Still has some mild discomfort but symptoms are generally limited, not affecting sleep what they were before.  Pain is primarily located over outside of right hip, no change in this. Discussed options with patient today, he would prefer to continue with home exercises and monitoring for now Again discussed possibility of local steroid injection, however given that he has been doing well, feel that it is reasonable to continue with conservative measures and monitoring for the time being Also discussed possibility of referral to physical therapy, can consider as needed in the future Plan for follow-up as needed, particularly if any new or worsening symptoms develop  Return if symptoms worsen or fail to improve, for Right hip pain.   ___________________________________________ Katrine Radich de [place], MD, ABFM, CAQSM [facility] [place]

## 2022-05-18 NOTE — Patient Instructions (Signed)

## 2022-06-03 ENCOUNTER — Other Ambulatory Visit (HOSPITAL_BASED_OUTPATIENT_CLINIC_OR_DEPARTMENT_OTHER): Payer: Self-pay

## 2022-06-03 ENCOUNTER — Encounter (HOSPITAL_BASED_OUTPATIENT_CLINIC_OR_DEPARTMENT_OTHER): Payer: Self-pay | Admitting: Nurse Practitioner

## 2022-06-03 ENCOUNTER — Other Ambulatory Visit (HOSPITAL_BASED_OUTPATIENT_CLINIC_OR_DEPARTMENT_OTHER): Payer: Self-pay | Admitting: Nurse Practitioner

## 2022-06-03 DIAGNOSIS — G4700 Insomnia, unspecified: Secondary | ICD-10-CM

## 2022-06-03 MED ORDER — ZOLPIDEM TARTRATE 5 MG PO TABS
5.0000 mg | ORAL_TABLET | Freq: Every evening | ORAL | 0 refills | Status: DC | PRN
  Filled 2022-06-03: qty 30, 30d supply, fill #0

## 2022-06-04 ENCOUNTER — Other Ambulatory Visit (HOSPITAL_BASED_OUTPATIENT_CLINIC_OR_DEPARTMENT_OTHER): Payer: Self-pay

## 2022-07-10 ENCOUNTER — Other Ambulatory Visit (HOSPITAL_BASED_OUTPATIENT_CLINIC_OR_DEPARTMENT_OTHER): Payer: Self-pay

## 2022-07-10 ENCOUNTER — Encounter (HOSPITAL_BASED_OUTPATIENT_CLINIC_OR_DEPARTMENT_OTHER): Payer: Self-pay | Admitting: Nurse Practitioner

## 2022-07-13 ENCOUNTER — Other Ambulatory Visit (HOSPITAL_BASED_OUTPATIENT_CLINIC_OR_DEPARTMENT_OTHER): Payer: Self-pay

## 2022-07-13 DIAGNOSIS — E782 Mixed hyperlipidemia: Secondary | ICD-10-CM

## 2022-07-13 MED ORDER — ROSUVASTATIN CALCIUM 20 MG PO TABS
20.0000 mg | ORAL_TABLET | Freq: Every day | ORAL | 1 refills | Status: DC
  Filled 2022-07-13: qty 90, 90d supply, fill #0
  Filled 2022-10-07: qty 90, 90d supply, fill #1

## 2022-07-17 DIAGNOSIS — Z23 Encounter for immunization: Secondary | ICD-10-CM | POA: Diagnosis not present

## 2022-07-28 DIAGNOSIS — Z23 Encounter for immunization: Secondary | ICD-10-CM | POA: Diagnosis not present

## 2022-08-05 ENCOUNTER — Other Ambulatory Visit (HOSPITAL_BASED_OUTPATIENT_CLINIC_OR_DEPARTMENT_OTHER): Payer: Self-pay

## 2022-09-22 ENCOUNTER — Encounter: Payer: Self-pay | Admitting: Nurse Practitioner

## 2022-10-07 ENCOUNTER — Other Ambulatory Visit: Payer: Self-pay | Admitting: Nurse Practitioner

## 2022-10-07 ENCOUNTER — Other Ambulatory Visit: Payer: Self-pay

## 2022-10-07 DIAGNOSIS — F418 Other specified anxiety disorders: Secondary | ICD-10-CM

## 2022-10-08 ENCOUNTER — Other Ambulatory Visit (HOSPITAL_BASED_OUTPATIENT_CLINIC_OR_DEPARTMENT_OTHER): Payer: Self-pay

## 2022-10-08 ENCOUNTER — Other Ambulatory Visit: Payer: Self-pay | Admitting: Nurse Practitioner

## 2022-10-08 DIAGNOSIS — D225 Melanocytic nevi of trunk: Secondary | ICD-10-CM | POA: Diagnosis not present

## 2022-10-08 DIAGNOSIS — D2372 Other benign neoplasm of skin of left lower limb, including hip: Secondary | ICD-10-CM | POA: Diagnosis not present

## 2022-10-08 DIAGNOSIS — L820 Inflamed seborrheic keratosis: Secondary | ICD-10-CM | POA: Diagnosis not present

## 2022-10-08 DIAGNOSIS — D1801 Hemangioma of skin and subcutaneous tissue: Secondary | ICD-10-CM | POA: Diagnosis not present

## 2022-10-08 DIAGNOSIS — D2262 Melanocytic nevi of left upper limb, including shoulder: Secondary | ICD-10-CM | POA: Diagnosis not present

## 2022-10-08 DIAGNOSIS — L821 Other seborrheic keratosis: Secondary | ICD-10-CM | POA: Diagnosis not present

## 2022-10-08 DIAGNOSIS — F418 Other specified anxiety disorders: Secondary | ICD-10-CM

## 2022-10-08 DIAGNOSIS — L570 Actinic keratosis: Secondary | ICD-10-CM | POA: Diagnosis not present

## 2022-10-09 ENCOUNTER — Other Ambulatory Visit (HOSPITAL_BASED_OUTPATIENT_CLINIC_OR_DEPARTMENT_OTHER): Payer: Self-pay

## 2022-10-09 MED ORDER — BUSPIRONE HCL 7.5 MG PO TABS
7.5000 mg | ORAL_TABLET | Freq: Three times a day (TID) | ORAL | 3 refills | Status: DC
  Filled 2022-10-09: qty 270, 90d supply, fill #0
  Filled 2023-04-12: qty 270, 90d supply, fill #1
  Filled 2023-07-12: qty 270, 90d supply, fill #2

## 2022-10-10 ENCOUNTER — Other Ambulatory Visit (HOSPITAL_BASED_OUTPATIENT_CLINIC_OR_DEPARTMENT_OTHER): Payer: Self-pay

## 2022-11-01 ENCOUNTER — Encounter: Payer: Self-pay | Admitting: Nurse Practitioner

## 2022-11-04 ENCOUNTER — Telehealth: Payer: Self-pay | Admitting: Nurse Practitioner

## 2022-11-04 DIAGNOSIS — R739 Hyperglycemia, unspecified: Secondary | ICD-10-CM | POA: Insufficient documentation

## 2022-11-04 DIAGNOSIS — E782 Mixed hyperlipidemia: Secondary | ICD-10-CM

## 2022-11-04 DIAGNOSIS — I10 Essential (primary) hypertension: Secondary | ICD-10-CM

## 2022-11-04 DIAGNOSIS — N401 Enlarged prostate with lower urinary tract symptoms: Secondary | ICD-10-CM

## 2022-11-04 DIAGNOSIS — Z0000 Encounter for general adult medical examination without abnormal findings: Secondary | ICD-10-CM

## 2022-11-04 DIAGNOSIS — I798 Other disorders of arteries, arterioles and capillaries in diseases classified elsewhere: Secondary | ICD-10-CM | POA: Insufficient documentation

## 2022-11-04 DIAGNOSIS — I77810 Thoracic aortic ectasia: Secondary | ICD-10-CM

## 2022-11-04 NOTE — Telephone Encounter (Signed)
Please call Clinton Newton to set up a time for him to come get labs prior to appt on 11/17/22 for CPE. I have placed the orders for the labs. Please also ask if he has received his COVID vaccine and update this if he has. Thank you so much!

## 2022-11-11 ENCOUNTER — Encounter: Payer: Self-pay | Admitting: Gastroenterology

## 2022-11-12 ENCOUNTER — Other Ambulatory Visit: Payer: Self-pay

## 2022-11-13 ENCOUNTER — Other Ambulatory Visit (HOSPITAL_BASED_OUTPATIENT_CLINIC_OR_DEPARTMENT_OTHER): Payer: Self-pay

## 2022-11-13 LAB — CBC WITH DIFFERENTIAL/PLATELET
Basophils Absolute: 0 10*3/uL (ref 0.0–0.2)
Basos: 0 %
EOS (ABSOLUTE): 0.1 10*3/uL (ref 0.0–0.4)
Eos: 2 %
Hematocrit: 46.3 % (ref 37.5–51.0)
Hemoglobin: 16.2 g/dL (ref 13.0–17.7)
Immature Grans (Abs): 0 10*3/uL (ref 0.0–0.1)
Immature Granulocytes: 0 %
Lymphocytes Absolute: 1.8 10*3/uL (ref 0.7–3.1)
Lymphs: 35 %
MCH: 32.2 pg (ref 26.6–33.0)
MCHC: 35 g/dL (ref 31.5–35.7)
MCV: 92 fL (ref 79–97)
Monocytes Absolute: 0.6 10*3/uL (ref 0.1–0.9)
Monocytes: 12 %
Neutrophils Absolute: 2.5 10*3/uL (ref 1.4–7.0)
Neutrophils: 51 %
Platelets: 235 10*3/uL (ref 150–450)
RBC: 5.03 x10E6/uL (ref 4.14–5.80)
RDW: 12.3 % (ref 11.6–15.4)
WBC: 5.1 10*3/uL (ref 3.4–10.8)

## 2022-11-13 LAB — PSA TOTAL (REFLEX TO FREE): Prostate Specific Ag, Serum: 2.1 ng/mL (ref 0.0–4.0)

## 2022-11-13 LAB — COMPREHENSIVE METABOLIC PANEL
ALT: 38 IU/L (ref 0–44)
AST: 29 IU/L (ref 0–40)
Albumin/Globulin Ratio: 2.1 (ref 1.2–2.2)
Albumin: 4.7 g/dL (ref 3.9–4.9)
Alkaline Phosphatase: 60 IU/L (ref 44–121)
BUN/Creatinine Ratio: 9 — ABNORMAL LOW (ref 10–24)
BUN: 7 mg/dL — ABNORMAL LOW (ref 8–27)
Bilirubin Total: 0.6 mg/dL (ref 0.0–1.2)
CO2: 24 mmol/L (ref 20–29)
Calcium: 9.1 mg/dL (ref 8.6–10.2)
Chloride: 105 mmol/L (ref 96–106)
Creatinine, Ser: 0.77 mg/dL (ref 0.76–1.27)
Globulin, Total: 2.2 g/dL (ref 1.5–4.5)
Glucose: 101 mg/dL — ABNORMAL HIGH (ref 70–99)
Potassium: 4.1 mmol/L (ref 3.5–5.2)
Sodium: 145 mmol/L — ABNORMAL HIGH (ref 134–144)
Total Protein: 6.9 g/dL (ref 6.0–8.5)
eGFR: 96 mL/min/{1.73_m2} (ref >59)

## 2022-11-13 LAB — LIPID PANEL
Chol/HDL Ratio: 2.9 ratio (ref 0.0–5.0)
Cholesterol, Total: 140 mg/dL (ref 100–199)
HDL: 49 mg/dL (ref >39)
LDL Chol Calc (NIH): 65 mg/dL (ref 0–99)
Triglycerides: 149 mg/dL (ref 0–149)
VLDL Cholesterol Cal: 26 mg/dL (ref 5–40)

## 2022-11-13 LAB — HEMOGLOBIN A1C
Est. average glucose Bld gHb Est-mCnc: 111 mg/dL
Hgb A1c MFr Bld: 5.5 % (ref 4.8–5.6)

## 2022-11-17 ENCOUNTER — Encounter: Payer: Self-pay | Admitting: Gastroenterology

## 2022-11-17 ENCOUNTER — Ambulatory Visit (INDEPENDENT_AMBULATORY_CARE_PROVIDER_SITE_OTHER): Payer: Medicare Other | Admitting: Nurse Practitioner

## 2022-11-17 ENCOUNTER — Other Ambulatory Visit (HOSPITAL_BASED_OUTPATIENT_CLINIC_OR_DEPARTMENT_OTHER): Payer: Self-pay

## 2022-11-17 ENCOUNTER — Encounter: Payer: Self-pay | Admitting: Nurse Practitioner

## 2022-11-17 VITALS — BP 124/82 | HR 77 | Temp 98.2°F | Ht 69.25 in | Wt 186.2 lb

## 2022-11-17 DIAGNOSIS — N401 Enlarged prostate with lower urinary tract symptoms: Secondary | ICD-10-CM | POA: Diagnosis not present

## 2022-11-17 DIAGNOSIS — R3914 Feeling of incomplete bladder emptying: Secondary | ICD-10-CM

## 2022-11-17 DIAGNOSIS — G4700 Insomnia, unspecified: Secondary | ICD-10-CM | POA: Diagnosis not present

## 2022-11-17 MED ORDER — TAMSULOSIN HCL 0.4 MG PO CAPS
0.4000 mg | ORAL_CAPSULE | Freq: Every day | ORAL | 3 refills | Status: DC
  Filled 2022-11-17: qty 30, 30d supply, fill #0
  Filled 2022-12-24: qty 30, 30d supply, fill #1

## 2022-11-17 MED ORDER — ZOLPIDEM TARTRATE 5 MG PO TABS
5.0000 mg | ORAL_TABLET | Freq: Every evening | ORAL | 0 refills | Status: DC | PRN
  Filled 2022-11-17: qty 30, 30d supply, fill #0

## 2022-11-17 NOTE — Patient Instructions (Addendum)
If the tamsulosin does not not help with the night time awakenings, please let me know and we can always send the referral to urology for you.   Everything looks great on your lab work! Keep up the great work.

## 2022-11-17 NOTE — Progress Notes (Signed)
[place] VISIT  11/17/2022  Subjective:  Clinton Newton is a 72 y.o. male patient of Zaray Gatchel, Coralee Pesa, NP who had a Medicare Annual Wellness Visit today. Biagio is Retired and lives with their spouse. he has no children. he reports that he is socially active and does interact with friends/family regularly. he is markedly physically active and enjoys painting- he tells me he paints every day and enjoys painting on mirror to create a three dimensional appearance.   He is taking the [place] only when he is traveling.  He is only taking the buspar right before he goes to see his mother in law to help with the stressors of this.   Patient Care Team: Maegen Wigle, Coralee Pesa, NP as PCP - General (Nurse Practitioner) Irine Seal, MD as Attending Physician (Urology) Gaynelle Arabian, MD as Consulting Physician (Orthopedic Surgery) Crista Luria, MD as Consulting Physician (Dermatology) Ladene Artist, MD as Consulting Physician (Gastroenterology) Luberta Mutter, MD as Consulting Physician (Ophthalmology)     11/17/2022    8:56 AM 11/09/2020    3:15 PM 02/22/2020    2:00 PM 11/09/2017    8:34 AM 02/05/2017    2:16 PM 12/22/2016    2:07 PM 12/01/2016    3:51 PM  Advanced Directives  Does Patient Have a Medical Advance Directive? Yes No Yes Yes Yes No Yes  Type of Paramedic of [place];Living will  Living will;Healthcare Power of [place];Living will  [place];Living will  Does patient want to make changes to medical advance directive? Yes (ED - Information included in AVS)  No - Patient declined    No - Patient declined  Copy of [place] in Chart? Yes - validated most recent copy scanned in chart (See row information)  No - copy requested  No - copy requested  No - copy requested  Would patient like information on creating a medical advance directive?  No  - Patient declined         Hospital Utilization Over the Past 12 Months: # of hospitalizations or ER visits: 0 # of surgeries: 0  Review of Systems    Patient reports that his overall health is better when compared to last year.  Review of Systems: Urinary hesitancy and retention. He tells me that he is up several times a night and this is interfering with sleep  All other systems negative.  Pain Assessment  None     Current Medications & Allergies (verified) Allergies as of 11/17/2022   No Known Allergies      Medication List        Accurate as of November 17, 2022  6:40 PM. If you have any questions, ask your nurse or doctor.          STOP taking these medications    BETA-SITOSTEROL PLANT STEROLS PO Stopped by: Orma Render, NP   methocarbamol 750 MG tablet Commonly known as: Robaxin-750 Stopped by: Orma Render, NP   Saw Palmetto 500 MG Caps Stopped by: Orma Render, NP       TAKE these medications    amLODipine 10 MG tablet Commonly known as: NORVASC Take 1 tablet by mouth every day. (TAKE 1 TABLET BY MOUTH EVERY DAY)   ascorbic acid 1000 MG tablet Commonly known as: VITAMIN C Take 1,000 mg by mouth daily.   bisoprolol 5 MG tablet Commonly known as: ZEBETA  Take 1/2 tablet (2.5 mg total) by mouth daily.   busPIRone 7.5 MG tablet Commonly known as: BUSPAR Take 1 tablet (7.5 mg total) by mouth 3 (three) times daily.   cholecalciferol 1000 units tablet Commonly known as: VITAMIN D Take 1,000 Units by mouth daily.   cyanocobalamin 1000 MCG tablet Commonly known as: VITAMIN B12 Take 1,000 mcg by mouth daily.   FISH OIL PO Take by mouth daily.   FLAXSEED OIL PO Take by mouth as directed.   multivitamin tablet Take 1 tablet by mouth daily.   PROBIOTIC PO Take by mouth daily.   rosuvastatin 20 MG tablet Commonly known as: CRESTOR Take 1 tablet (20 mg total) by mouth daily.   tamsulosin 0.4 MG Caps capsule Commonly known as:  FLOMAX Take 1 capsule (0.4 mg total) by mouth daily. Started by: Orma Render, NP   zolpidem 5 MG tablet Commonly known as: AMBIEN Take 1 tablet (5 mg total) by mouth at bedtime as needed. for sleep        History (reviewed): Past Medical History:  Diagnosis Date   Arthritis    L3-L4   Cataract    Colon polyps    Diverticulitis    GERD (gastroesophageal reflux disease)    Hyperlipidemia    Hypertension    Past Surgical History:  Procedure Laterality Date   APPENDECTOMY     COLONOSCOPY     POLYPECTOMY     PRE-MALIGNANT / BENIGN SKIN LESION EXCISION     Family History  Problem Relation Age of Onset   Heart disease Father    Colon cancer Neg Hx    Pancreatic cancer Neg Hx    Rectal cancer Neg Hx    Stomach cancer Neg Hx    Esophageal cancer Neg Hx    Social History   Socioeconomic History   Marital status: Married    Spouse name: Not on file   Number of children: 0   Years of education: Not on file   Highest education level: Not on file  Occupational History   Occupation: Retired  Tobacco Use   Smoking status: Never   Smokeless tobacco: Never  Vaping Use   Vaping Use: Never used  Substance and Sexual Activity   Alcohol use: Yes    Alcohol/week: 7.0 standard drinks of alcohol    Types: 7 Glasses of wine per week   Drug use: No   Sexual activity: Yes  Other Topics Concern   Not on file  Social History Narrative   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, does art - glass, painting, metal, body painting      Home Situation: lives with wife      Lifestyle: working out on a regular basis; diet is healthy         Social Determinants of Radio broadcast assistant Strain: [place]  (02/13/2022)   Overall Financial Resource Strain (CARDIA)    Difficulty of Paying Living Expenses: Not hard at all  Food Insecurity: No Food Insecurity (02/13/2022)   Hunger Vital Sign    Worried About Running Out of Food in the Last Year: Never true     [place] in the Last Year: Never true  Transportation Needs: No Transportation Needs (02/13/2022)   PRAPARE - Hydrologist (Medical): No    Lack of Transportation (Non-Medical): No  Physical Activity: Sufficiently Active (02/13/2022)   Exercise Vital Sign  Days of Exercise per Week: 4 days    Minutes of Exercise per Session: 50 min  Stress: No Stress Concern Present (02/13/2022)   August    Feeling of Stress : Not at all  Social Connections: Moderately Isolated (02/13/2022)   Social Connection and Isolation Panel [NHANES]    Frequency of Communication with Friends and Family: Never    Frequency of Social Gatherings with Friends and Family: More than three times a week    Attends Religious Services: Never    Marine scientist or Organizations: No    Attends Archivist Meetings: Never    Marital Status: Married    Activities of Daily Living    11/17/2022    8:57 AM 05/18/2022    1:13 PM  In your present state of health, do you have any difficulty performing the following activities:  Hearing? 0 0  Vision? 0 0  Difficulty concentrating or making decisions? 0 0  Walking or climbing stairs? 0 0  Dressing or bathing? 0 0  Doing errands, shopping?  0  Preparing Food and eating ? N   Using the Toilet? N   In the past six months, have you accidently leaked urine? N   Do you have problems with loss of bowel control? N   Managing your Medications? N   Managing your Finances? N   Housekeeping or managing your Housekeeping? N     Patient Education/Literacy    Exercise Current Exercise Habits: Home exercise routine;Structured exercise class, Type of exercise: strength training/weights;walking, Time (Minutes): 60, Frequency (Times/Week): 4, Weekly Exercise (Minutes/Week): 240, Intensity: Mild, Exercise limited by: None identified  Diet Patient reports consuming 3  meals a day and 1 snack(s) a day Patient reports that his primary diet is: Regular Patient reports that she does have regular access to food.   Depression Screen    11/17/2022    8:54 AM 05/18/2022    1:13 PM 04/06/2022    1:10 PM 02/18/2022    1:28 PM 02/13/2022    2:35 PM 05/09/2021   10:55 AM 07/24/2020    8:23 AM  PHQ 2/9 Scores  PHQ - 2 Score 0 0 0 0 0 0 0  PHQ- 9 Score  0 0      Exception Documentation  Medical reason Medical reason         Fall Risk    11/17/2022    8:54 AM 05/18/2022    1:13 PM 04/06/2022    1:10 PM 02/18/2022    1:28 PM 02/13/2022    2:39 PM  [place] in the past year? 0 0 0 0 0  Number falls in past yr: 0 0 0 0 0  Injury with Fall? 0 0 0 0 0  Risk for fall due to : No Fall Risks No Fall Risks No Fall Risks No Fall Risks No Fall Risks  Follow up Falls evaluation completed Falls evaluation completed  Falls evaluation completed Falls evaluation completed     Objective:   BP 124/82   Pulse 77   Temp 98.2 F (36.8 C)   Ht 5' 9.25" (1.759 m)   Wt 186 lb 3.2 oz (84.5 kg)   BMI 27.30 kg/m   Last Weight  Most recent update: 11/17/2022  9:02 AM    Weight  84.5 kg (186 lb 3.2 oz)             Body mass  index is 27.3 kg/m.  Hearing/Vision  Da[place]tae did not have difficulty with hearing/understanding during the face-to-face interview Evens did not have difficulty with his vision during the face-to-face interview Reports that he has had a formal eye exam by an eye care professional within the past year Reports that he has not had a formal hearing evaluation within the past year  Cognitive Function:    02/13/2022    2:40 PM 02/22/2020    2:09 PM  6CIT Screen  What Year? 0 points 0 points  What month? 0 points 0 points  What time? 3 points 0 points  Count back from 20 0 points 0 points  Months in reverse 0 points 0 points  Repeat phrase 0 points 0 points  Total Score 3 points 0 points    Normal Cognitive Function Screening:  Yes (Normal:0-7, Significant for Dysfunction: >8)  Immunization & Health Maintenance Record Immunization History  Administered Date(s) Administered   Fluad Quad(high Dose 65+) 06/23/2019, 07/24/2020   Influenza Split 09/19/2012   Influenza Whole 08/30/2007, 07/11/2010   Influenza, High Dose Seasonal PF 06/23/2018   Influenza,inj,Quad PF,6+ Mos 07/18/2013, 06/28/2015, 07/07/2017   Influenza-Unspecified 08/25/2014, 07/07/2016, 06/26/2017, 06/16/2021, 09/17/2022   PFIZER(Purple Top)SARS-COV-2 Vaccination 11/30/2019, 12/21/2019, 06/17/2020   Pfizer Covid-19 Vaccine Bivalent Booster 29yr & up 08/29/2021   Pneumococcal Conjugate-13 01/11/2018   Pneumococcal Polysaccharide-23 06/23/2019   Td 04/28/2007, 09/09/2017   Unspecified SARS-COV-2 Vaccination 09/17/2022   Zoster Recombinat (Shingrix) 05/14/2020, 10/02/2020   Zoster, Live 03/22/2013    Health Maintenance  Topic Date Due   COLONOSCOPY (Pts 45-420yrInsurance coverage will need to be confirmed)  11/09/2022   COVID-19 Vaccine (6 - 2023-24 season) 11/12/2022   Medicare Annual Wellness (AWV)  11/18/2023   DTaP/Tdap/Td (3 - Tdap) 09/10/2027   Pneumonia Vaccine 6525Years old  Completed   INFLUENZA VACCINE  Completed   Hepatitis C Screening  Completed   Zoster Vaccines- Shingrix  Completed   HPV VACCINES  Aged Out       Assessment  This is a routine wellness examination for [employer] Health Maintenance: Due or Overdue Health Maintenance Due  Topic Date Due   COLONOSCOPY (Pts [id number] coverage will need to be confirmed)  11/09/2022   COVID-19 Vaccine (6 - 2023-24 season) 11/12/2022    FraBirder Robsones not need a referral for Community Assistance: Care Management:   not applicable Social Work:    not applicable Prescription Assistance:  not applicable Nutrition/Diabetes Education:  not applicable   Plan:  Personalized Goals  Goals Addressed   None    Personalized Health Maintenance & Screening  Recommendations  Colorectal cancer screening  Lung Cancer Screening Recommended: not applicable (Low Dose CT Chest recommended if Age 43-18-80ars, 30 pack-year currently smoking OR have quit w/in past 15 years) Hepatitis C Screening recommended: not applicable HIV Screening recommended: not applicable  Advanced Directives: Written information was not given per the patient's request.  Referrals & Orders No orders of the defined types were placed in this encounter.   Follow-up Plan Follow-up with Josean Lycan, SarCoralee PesaP as planned   I have personally reviewed and noted the following in the patient's chart:   Medical and social history Use of alcohol, tobacco or illicit drugs  Current medications and supplements Functional ability and status Nutritional status Physical activity Advanced directives List of other physicians Hospitalizations, surgeries, and ER visits in previous 12 months Vitals Screenings to include cognitive, depression, and falls Referrals and appointments  In addition,  I have reviewed and discussed with patient certain preventive protocols, quality metrics, and best practice recommendations. A written personalized care plan for preventive services as well as general preventive health recommendations were provided to patient.     Orma Render, DNP, AGNP-c   11/17/2022

## 2022-12-12 ENCOUNTER — Encounter: Payer: Self-pay | Admitting: Nurse Practitioner

## 2022-12-16 ENCOUNTER — Other Ambulatory Visit (HOSPITAL_BASED_OUTPATIENT_CLINIC_OR_DEPARTMENT_OTHER): Payer: Self-pay

## 2022-12-16 ENCOUNTER — Ambulatory Visit (AMBULATORY_SURGERY_CENTER): Payer: Medicare Other | Admitting: *Deleted

## 2022-12-16 ENCOUNTER — Other Ambulatory Visit: Payer: Self-pay

## 2022-12-16 VITALS — Ht 68.0 in | Wt 183.0 lb

## 2022-12-16 DIAGNOSIS — Z86010 Personal history of colonic polyps: Secondary | ICD-10-CM

## 2022-12-16 MED ORDER — PEG 3350-KCL-NA BICARB-NACL 420 G PO SOLR
4000.0000 mL | Freq: Once | ORAL | 0 refills | Status: AC
  Filled 2022-12-16 (×2): qty 4000, 1d supply, fill #0

## 2022-12-16 NOTE — Progress Notes (Signed)
No egg or soy allergy known to patient  No issues known to pt with past sedation with any surgeries or procedures Patient denies ever being told they had issues or difficulty with intubation  No FH of Malignant Hyperthermia Pt is not on diet pills Pt is not on  home 02  Pt is not on blood thinners  Pt denies issues with constipation  Pt is not on dialysis Pt denies any upcoming cardiac testing Pt encouraged to use to use Singlecare or Goodrx to reduce cost  Patient's chart reviewed by Osvaldo Angst CNRA prior to previsit and patient appropriate for the [place].  Previsit completed and red dot placed by patient's name on their procedure day (on provider's schedule).  . Visit by phone Instructions reviewed with pt and pt states understanding. Instructed to review again prior to procedure. Pt states they will.  Instructions sent by mail and my chart

## 2022-12-17 ENCOUNTER — Encounter: Payer: Self-pay | Admitting: Gastroenterology

## 2022-12-21 ENCOUNTER — Other Ambulatory Visit (HOSPITAL_BASED_OUTPATIENT_CLINIC_OR_DEPARTMENT_OTHER): Payer: Self-pay

## 2022-12-21 ENCOUNTER — Telehealth (INDEPENDENT_AMBULATORY_CARE_PROVIDER_SITE_OTHER): Payer: Medicare Other | Admitting: Nurse Practitioner

## 2022-12-21 ENCOUNTER — Encounter: Payer: Self-pay | Admitting: Nurse Practitioner

## 2022-12-21 VITALS — BP 130/80 | HR 97 | Ht 68.0 in | Wt 183.0 lb

## 2022-12-21 DIAGNOSIS — R3914 Feeling of incomplete bladder emptying: Secondary | ICD-10-CM

## 2022-12-21 DIAGNOSIS — N401 Enlarged prostate with lower urinary tract symptoms: Secondary | ICD-10-CM

## 2022-12-21 DIAGNOSIS — R351 Nocturia: Secondary | ICD-10-CM | POA: Diagnosis not present

## 2022-12-21 DIAGNOSIS — T887XXA Unspecified adverse effect of drug or medicament, initial encounter: Secondary | ICD-10-CM

## 2022-12-21 NOTE — Assessment & Plan Note (Signed)
The patient is experiencing side effects that may be related to flomax, including elevated heart rate, waking up thirsty, increased headaches, and sexual dysfunction. Review of alternative medication options shows similar risks.  Plan: - Discussed the potential for alternative medications, such as finasteride. - Ultimate decision for patient to contact Dr. Denton Lank office to see if he can get in for evaluation.  - If it will take a few weeks to get in with him, we can consider switching the medication.  - Monitor the symptoms for any exacerbation and report any new or concerning symptoms promptly.

## 2022-12-21 NOTE — Assessment & Plan Note (Signed)
Chronic BPH with recent trial of flomax for management. Fortunately, the patient did have improvement of LUTS, however, side effects were increased while on the medication and bothersome. Nocturia was not particularly resolved.  Plan: - Contact Dr. Ralene Muskrat office to see if he can get in to see him for recommendations - Consider trial of finasteride as alternative treatment option.

## 2022-12-21 NOTE — Progress Notes (Signed)
Virtual Visit Encounter mychart visit.   I connected with  Birder Robson on 12/21/22 at 11:15 AM EST by secure video and audio telemedicine application. I verified that I am speaking with the correct person using two identifiers.   I introduced myself as a Designer, jewellery with the practice. The limitations of evaluation and management by telemedicine discussed with the patient and the availability of in person appointments. The patient expressed verbal understanding and consent to proceed.  Participating parties in this visit include: Myself and patient  The patient is: Patient Location: Home I am: Provider Location: Office/Clinic Subjective:    CC and HPI: Clinton Newton is a 72 y.o. year old male presenting for follow up of BPH.  The patient presents today with concerns primarily related to side effects from flomax.  He reports the medication has certainly helped with his flow and complete emptying of his bladder, but he has had some unwanted side effects that he is concerned about. He reports experiencing an increased frequency of headaches, waking up thirsty, elevated heart rate (90's), and inability to achieve orgasm. He describes that he is still getting up to void 1-3 times a night and is still needing to take naps during the day due to interrupted sleep.   In the past he has seen Dr. Jeffie Pollock with [facility] for his urinary symptoms. He does not want a biopsy or other surgical intervention at this time and has some concerns about returning due to this conversation in the past. However, he expresses unease regarding the side effects he is encountering and demonstrates openness to considering alternative medications. He is ok going back for a new consult with Dr. Jeffie Pollock if I feel this is needed.   Past medical history, Surgical history, Family history not pertinant except as noted below, Social history, Allergies, and medications have been entered into the medical record, reviewed, and  corrections made.   Review of Systems:  All review of systems negative except what is listed in the HPI  Objective:    Alert and oriented x 4 Speaking in clear sentences with no shortness of breath. No distress.  Impression and Recommendations:    Problem List Items Addressed This Visit     BPH (benign prostatic hyperplasia) - Primary    Chronic BPH with recent trial of flomax for management. Fortunately, the patient did have improvement of LUTS, however, side effects were increased while on the medication and bothersome. Nocturia was not particularly resolved.  Plan: - Contact Dr. Ralene Muskrat office to see if he can get in to see him for recommendations - Consider trial of finasteride as alternative treatment option.        Medication side effects    The patient is experiencing side effects that may be related to flomax, including elevated heart rate, waking up thirsty, increased headaches, and sexual dysfunction. Review of alternative medication options shows similar risks.  Plan: - Discussed the potential for alternative medications, such as finasteride. - Ultimate decision for patient to contact Dr. Denton Lank office to see if he can get in for evaluation.  - If it will take a few weeks to get in with him, we can consider switching the medication.  - Monitor the symptoms for any exacerbation and report any new or concerning symptoms promptly.      Nocturia    orders and follow up as documented in EMR I discussed the assessment and treatment plan with the patient. The patient was provided an opportunity to ask questions and  all were answered. The patient agreed with the plan and demonstrated an understanding of the instructions.   The patient was advised to call back or seek an in-person evaluation if the symptoms worsen or if the condition fails to improve as anticipated.  Follow-Up: after appt with Urology  I provided 21 minutes of non-face-to-face interaction with this non  face-to-face encounter including intake, same-day documentation, and chart review.   Orma Render, NP , DNP, AGNP-c [facility]

## 2022-12-29 ENCOUNTER — Encounter: Payer: Self-pay | Admitting: Certified Registered Nurse Anesthetist

## 2022-12-30 ENCOUNTER — Ambulatory Visit (AMBULATORY_SURGERY_CENTER): Payer: Medicare Other | Admitting: Gastroenterology

## 2022-12-30 ENCOUNTER — Encounter: Payer: Self-pay | Admitting: Gastroenterology

## 2022-12-30 VITALS — BP 135/85 | HR 64 | Temp 97.1°F | Resp 13 | Ht 68.0 in | Wt 183.0 lb

## 2022-12-30 DIAGNOSIS — Z86010 Personal history of colonic polyps: Secondary | ICD-10-CM

## 2022-12-30 DIAGNOSIS — I10 Essential (primary) hypertension: Secondary | ICD-10-CM | POA: Diagnosis not present

## 2022-12-30 DIAGNOSIS — D123 Benign neoplasm of transverse colon: Secondary | ICD-10-CM | POA: Diagnosis not present

## 2022-12-30 DIAGNOSIS — Z09 Encounter for follow-up examination after completed treatment for conditions other than malignant neoplasm: Secondary | ICD-10-CM | POA: Diagnosis not present

## 2022-12-30 DIAGNOSIS — K219 Gastro-esophageal reflux disease without esophagitis: Secondary | ICD-10-CM | POA: Diagnosis not present

## 2022-12-30 MED ORDER — SODIUM CHLORIDE 0.9 % IV SOLN: 500.0000 mL | Freq: Once | INTRAVENOUS | Status: DC

## 2022-12-30 NOTE — Progress Notes (Signed)
Pt's states no medical or surgical changes since previsit or office visit. 

## 2022-12-30 NOTE — Progress Notes (Signed)
History & Physical  Primary Care Physician:  Orma Render, NP Primary Gastroenterologist: Lucio Edward, MD  Impression / Plan:  Personal history of adenomatous colon polyps for surveillance colonoscopy.  CHIEF COMPLAINT:  Personal history of colon polyps   HPI: Clinton Newton is a 72 y.o. male with a personal history of adenomatous colon polyps for surveillance colonoscopy.   Past Medical History:  Diagnosis Date   Arthritis    L3-L4   Cataract    Colon polyps    Diverticulitis    GERD (gastroesophageal reflux disease)    Hyperlipidemia    Hypertension     Past Surgical History:  Procedure Laterality Date   APPENDECTOMY     COLONOSCOPY     POLYPECTOMY     PRE-MALIGNANT / BENIGN SKIN LESION EXCISION      Prior to Admission medications   Medication Sig Start Date End Date Taking? Authorizing Provider  amLODipine (NORVASC) 10 MG tablet TAKE 1 TABLET BY MOUTH EVERY DAY 05/05/22  Yes Early, Coralee Pesa, NP  ascorbic acid (VITAMIN C) 1000 MG tablet Take 1,000 mg by mouth daily.   Yes [provider]  bisoprolol (ZEBETA) 5 MG tablet Take 1/2 tablet (2.5 mg total) by mouth daily. 01/14/22  Yes Early, Coralee Pesa, NP  cholecalciferol (VITAMIN D) 1000 UNITS tablet Take 1,000 Units by mouth daily.   Yes [provider]  Flaxseed, Linseed, (FLAXSEED OIL PO) Take by mouth as directed.   Yes [provider]  Multiple Vitamin (MULTIVITAMIN) tablet Take 1 tablet by mouth daily.   Yes [provider]  Omega-3 Fatty Acids (FISH OIL PO) Take by mouth daily.   Yes [provider]  omeprazole (PRILOSEC) 10 MG capsule  10/26/08  Yes [provider]  Probiotic Product (PROBIOTIC PO) Take by mouth daily.   Yes [provider]  rosuvastatin (CRESTOR) 20 MG tablet Take 1 tablet (20 mg total) by mouth daily. 07/13/22  Yes Early, Coralee Pesa, NP  tamsulosin (FLOMAX) 0.4 MG CAPS capsule Take 1 capsule (0.4 mg total) by mouth daily. 11/17/22  Yes Early,  Coralee Pesa, NP  vitamin B-12 (CYANOCOBALAMIN) 1000 MCG tablet Take 1,000 mcg by mouth daily.   Yes [provider]  busPIRone (BUSPAR) 7.5 MG tablet Take 1 tablet (7.5 mg total) by mouth 3 (three) times daily. Patient not taking: Reported on 12/21/2022 10/09/22   Early, Coralee Pesa, NP  zolpidem (AMBIEN) 5 MG tablet Take 1 tablet (5 mg total) by mouth at bedtime as needed. for sleep Patient not taking: Reported on 12/16/2022 11/17/22   Orma Render, NP    Current Outpatient Medications  Medication Sig Dispense Refill   amLODipine (NORVASC) 10 MG tablet TAKE 1 TABLET BY MOUTH EVERY DAY 90 tablet 3   ascorbic acid (VITAMIN C) 1000 MG tablet Take 1,000 mg by mouth daily.     bisoprolol (ZEBETA) 5 MG tablet Take 1/2 tablet (2.5 mg total) by mouth daily. 45 tablet 3   cholecalciferol (VITAMIN D) 1000 UNITS tablet Take 1,000 Units by mouth daily.     Flaxseed, Linseed, (FLAXSEED OIL PO) Take by mouth as directed.     Multiple Vitamin (MULTIVITAMIN) tablet Take 1 tablet by mouth daily.     Omega-3 Fatty Acids (FISH OIL PO) Take by mouth daily.     omeprazole (PRILOSEC) 10 MG capsule      Probiotic Product (PROBIOTIC PO) Take by mouth daily.     rosuvastatin (CRESTOR) 20 MG tablet Take 1  tablet (20 mg total) by mouth daily. 90 tablet 1   tamsulosin (FLOMAX) 0.4 MG CAPS capsule Take 1 capsule (0.4 mg total) by mouth daily. 30 capsule 3   vitamin B-12 (CYANOCOBALAMIN) 1000 MCG tablet Take 1,000 mcg by mouth daily.     busPIRone (BUSPAR) 7.5 MG tablet Take 1 tablet (7.5 mg total) by mouth 3 (three) times daily. (Patient not taking: Reported on 12/21/2022) 270 tablet 3   zolpidem (AMBIEN) 5 MG tablet Take 1 tablet (5 mg total) by mouth at bedtime as needed. for sleep (Patient not taking: Reported on 12/16/2022) 30 tablet 0   Current Facility-Administered Medications  Medication Dose Route Frequency Provider Last Rate Last Admin   0.9 %  sodium chloride infusion  500 mL Intravenous Once Ladene Artist,  MD        Allergies as of 12/30/2022   (No Known Allergies)    Family History  Problem Relation Age of Onset   Heart disease Father    Colon cancer Neg Hx    Pancreatic cancer Neg Hx    Rectal cancer Neg Hx    Stomach cancer Neg Hx    Esophageal cancer Neg Hx    Colon polyps Neg Hx     Social History   Socioeconomic History   Marital status: Married    Spouse name: Not on file   Number of children: 0   Years of education: Not on file   Highest education level: Not on file  Occupational History   Occupation: Retired  Tobacco Use   Smoking status: Never   Smokeless tobacco: Never  Vaping Use   Vaping Use: Never used  Substance and Sexual Activity   Alcohol use: Yes    Alcohol/week: 7.0 standard drinks of alcohol    Types: 7 Glasses of wine per week   Drug use: No   Sexual activity: Yes  Other Topics Concern   Not on file  Social History Narrative   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, does art - glass, painting, metal, body painting      Home Situation: lives with wife      Lifestyle: working out on a regular basis; diet is healthy         Social Determinants of Radio broadcast assistant Strain: [place]  (02/13/2022)   Overall Financial Resource Strain (CARDIA)    Difficulty of Paying Living Expenses: Not hard at all  Food Insecurity: No Food Insecurity (02/13/2022)   Hunger Vital Sign    Worried About Running Out of Food in the Last Year: Never true    [place] in the Last Year: Never true  Transportation Needs: No Transportation Needs (02/13/2022)   PRAPARE - Hydrologist (Medical): No    Lack of Transportation (Non-Medical): No  Physical Activity: Sufficiently Active (02/13/2022)   Exercise Vital Sign    Days of Exercise per Week: 4 days    Minutes of Exercise per Session: 50 min  Stress: No Stress Concern Present (02/13/2022)   [place]    Feeling of Stress : Not at all  Social Connections: Moderately Isolated (02/13/2022)   Social Connection and Isolation Panel [NHANES]    Frequency of Communication with Friends and Family: Never    Frequency of Social Gatherings with Friends and Family: More than three times a week    Attends Religious Services: Never  Active Member of Clubs or Organizations: No    Attends Archivist Meetings: Never    Marital Status: Married  Human resources officer Violence: Not At Risk (02/13/2022)   Humiliation, Afraid, Rape, and Kick questionnaire    Fear of Current or Ex-Partner: No    Emotionally Abused: No    Physically Abused: No    Sexually Abused: No    Review of Systems:  All systems reviewed were negative except where noted in HPI.   Physical Exam: General:  Alert, well-developed, in NAD Head:  Normocephalic and atraumatic. Eyes:  Sclera clear, no icterus.   Conjunctiva pink. Ears:  Normal auditory acuity. Mouth:  No deformity or lesions.  Neck:  Supple; no masses. Lungs:  Clear throughout to auscultation.   No wheezes, crackles, or rhonchi.  Heart:  Regular rate and rhythm; no murmurs. Abdomen:  Soft, nondistended, nontender. No masses, hepatomegaly. No palpable masses.  Normal bowel sounds.    Rectal:  Deferred   Msk:  Symmetrical without gross deformities. Extremities:  Without edema. Neurologic:  Alert and  oriented x 4; grossly normal neurologically. Skin:  Intact without significant lesions or rashes. Psych:  Alert and cooperative. Normal mood and affect.    Pricilla Riffle. Fuller Plan  12/30/2022, 8:03 AM See Shea Evans, [facility], to contact our on call provider

## 2022-12-30 NOTE — Patient Instructions (Signed)
YOU HAD AN ENDOSCOPIC PROCEDURE TODAY AT THE [facility]:   Refer to the procedure report that was given to you for any specific questions about what was found during the examination.  If the procedure report does not answer your questions, please call your gastroenterologist to clarify.  If you requested that your care partner not be given the details of your procedure findings, then the procedure report has been included in a sealed envelope for you to review at your convenience later.  YOU SHOULD EXPECT: Some feelings of bloating in the abdomen. Passage of more gas than usual.  Walking can help get rid of the air that was put into your GI tract during the procedure and reduce the bloating. If you had a lower endoscopy (such as a colonoscopy or flexible sigmoidoscopy) you may notice spotting of blood in your stool or on the toilet paper. If you underwent a bowel prep for your procedure, you may not have a normal bowel movement for a few days.  Please Note:  You might notice some irritation and congestion in your nose or some drainage.  This is from the oxygen used during your procedure.  There is no need for concern and it should clear up in a day or so.  SYMPTOMS TO REPORT IMMEDIATELY:  Following lower endoscopy (colonoscopy or flexible sigmoidoscopy):  Excessive amounts of blood in the stool  Significant tenderness or worsening of abdominal pains  Swelling of the abdomen that is new, acute  Fever of 100F or higher  For urgent or emergent issues, a gastroenterologist can be reached at any hour by calling [phone]. Do not use MyChart messaging for urgent concerns.    DIET:  We do recommend a small meal at first, but then you may proceed to your regular diet.  Drink plenty of fluids but you should avoid alcoholic beverages for 24 hours.  ACTIVITY:  You should plan to take it easy for the rest of today and you should NOT DRIVE or use heavy machinery until tomorrow (because of  the sedation medicines used during the test).    FOLLOW UP: Our staff will call the number listed on your records the next business day following your procedure.  We will call around 7:15- 8:00 am to check on you and address any questions or concerns that you may have regarding the information given to you following your procedure. If we do not reach you, we will leave a message.     If any biopsies were taken you will be contacted by phone or by letter within the next 1-3 weeks.  Please call us at [phone] if you have not heard about the biopsies in 3 weeks.    SIGNATURES/CONFIDENTIALITY: You and/or your care partner have signed paperwork which will be entered into your electronic medical record.  These signatures attest to the fact that that the information above on your After Visit Summary has been reviewed and is understood.  Full responsibility of the confidentiality of this discharge information lies with you and/or your care-partner. 

## 2022-12-30 NOTE — Op Note (Signed)
[place] [facility] Patient Name: Clinton Newton Procedure Date: 12/30/2022 7:05 AM MRN: [id number] Endoscopist: Ladene Artist , MD, [id number] Age: 72 Referring MD:  Date of Birth: Mar 19, 1951 Gender: Male Account #: [id number] Procedure:                Colonoscopy Indications:              Surveillance: Personal history of adenomatous                            polyps on last colonoscopy 5 years ago Medicines:                Monitored Anesthesia Care Procedure:                Pre-Anesthesia Assessment:                           - Prior to the procedure, a History and Physical                            was performed, and patient medications and                            allergies were reviewed. The patient's tolerance of                            previous anesthesia was also reviewed. The risks                            and benefits of the procedure and the sedation                            options and risks were discussed with the patient.                            All questions were answered, and informed consent                            was obtained. Prior Anticoagulants: The patient has                            taken no anticoagulant or antiplatelet agents. ASA                            Grade Assessment: II - A patient with mild systemic                            disease. After reviewing the risks and benefits,                            the patient was deemed in satisfactory condition to                            undergo the procedure.  After obtaining informed consent, the colonoscope                            was passed under direct vision. Throughout the                            procedure, the patient's blood pressure, pulse, and                            oxygen saturations were monitored continuously. The                            CF [id number] was introduced through the anus                            and advanced to the the  cecum, identified by                            appendiceal orifice and ileocecal valve. The                            ileocecal valve, appendiceal orifice, and rectum                            were photographed. The quality of the bowel                            preparation was good. The colonoscopy was performed                            without difficulty. The patient tolerated the                            procedure well. Scope In: 8:06:05 AM Scope Out: 8:21:21 AM Scope Withdrawal Time: 0 hours 12 minutes 48 seconds  Total Procedure Duration: 0 hours 15 minutes 16 seconds  Findings:                 The perianal and digital rectal examinations were                            normal.                           A 6 mm polyp was found in the transverse colon. The                            polyp was sessile. The polyp was removed with a                            cold snare. Resection and retrieval were complete.                           Multiple medium-mouthed diverticula were found in  the left colon. There was evidence of diverticular                            spasm. There was no evidence of diverticular                            bleeding.                           Internal hemorrhoids were found during                            retroflexion. The hemorrhoids were moderate and                            Grade I (internal hemorrhoids that do not prolapse).                           The exam was otherwise without abnormality on                            direct and retroflexion views. Complications:            No immediate complications. Estimated blood loss:                            None. Estimated Blood Loss:     Estimated blood loss: none. Impression:               - One 6 mm polyp in the transverse colon, removed                            with a cold snare. Resected and retrieved.                           - Moderate diverticulosis in the left  colon.                           - Internal hemorrhoids.                           - The examination was otherwise normal on direct                            and retroflexion views. Recommendation:           - Repeat colonoscopy vs no repeat due to age after                            studies are complete for surveillance based on                            pathology results.                           - Patient has a contact number available for  emergencies. The signs and symptoms of potential                            delayed complications were discussed with the                            patient. Return to normal activities tomorrow.                            Written discharge instructions were provided to the                            patient.                           - High fiber diet.                           - Continue present medications.                           - Await pathology results. Ladene Artist, MD 12/30/2022 8:25:25 AM This report has been signed electronically.

## 2022-12-31 ENCOUNTER — Telehealth: Payer: Self-pay

## 2022-12-31 NOTE — Telephone Encounter (Signed)
Left message on follow up call. 

## 2023-01-01 ENCOUNTER — Encounter: Payer: Self-pay | Admitting: Gastroenterology

## 2023-01-01 ENCOUNTER — Encounter: Payer: Self-pay | Admitting: Nurse Practitioner

## 2023-01-01 DIAGNOSIS — Z23 Encounter for immunization: Secondary | ICD-10-CM | POA: Diagnosis not present

## 2023-01-04 ENCOUNTER — Encounter: Payer: Self-pay | Admitting: Urology

## 2023-01-04 ENCOUNTER — Other Ambulatory Visit (HOSPITAL_BASED_OUTPATIENT_CLINIC_OR_DEPARTMENT_OTHER): Payer: Self-pay

## 2023-01-04 ENCOUNTER — Ambulatory Visit (INDEPENDENT_AMBULATORY_CARE_PROVIDER_SITE_OTHER): Payer: Medicare Other | Admitting: Urology

## 2023-01-04 VITALS — BP 147/79 | HR 80 | Ht 68.0 in | Wt 183.0 lb

## 2023-01-04 DIAGNOSIS — N401 Enlarged prostate with lower urinary tract symptoms: Secondary | ICD-10-CM | POA: Diagnosis not present

## 2023-01-04 DIAGNOSIS — N138 Other obstructive and reflux uropathy: Secondary | ICD-10-CM | POA: Diagnosis not present

## 2023-01-04 LAB — URINALYSIS, ROUTINE W REFLEX MICROSCOPIC
Bilirubin, UA: NEGATIVE
Blood, UA: NEGATIVE
Cast Type: 0
Casts, Ur, LPF, POC: 0
Crystal Type: 0
Crystals, Ur, HPF, POC: 0
Glucose, UA: NEGATIVE mg/dL
Ketones, POC UA: NEGATIVE mg/dL
Nitrite, UA: NEGATIVE
Protein Ur, POC: NEGATIVE mg/dL
RBC, Urine, Miroscopic: 0 (ref 0–2)
Spec Grav, UA: 1.015 (ref 1.010–1.025)
Trichomonas: 0
Urobilinogen, UA: 0.2 E.U./dL
Yeast, UA: 0
pH, UA: 7 (ref 5.0–8.0)

## 2023-01-04 LAB — BLADDER SCAN AMB NON-IMAGING

## 2023-01-04 MED ORDER — ALFUZOSIN HCL ER 10 MG PO TB24
10.0000 mg | ORAL_TABLET | Freq: Every day | ORAL | 11 refills | Status: DC
  Filled 2023-01-04: qty 30, 30d supply, fill #0
  Filled 2023-02-06: qty 30, 30d supply, fill #1
  Filled 2023-03-13: qty 30, 30d supply, fill #2
  Filled 2023-04-12: qty 30, 30d supply, fill #3
  Filled 2023-05-20 (×2): qty 30, 30d supply, fill #4
  Filled 2023-06-11: qty 30, 30d supply, fill #5
  Filled 2023-07-12: qty 30, 30d supply, fill #6
  Filled 2023-08-18: qty 30, 30d supply, fill #7
  Filled 2023-09-22: qty 30, 30d supply, fill #8
  Filled 2023-10-28: qty 30, 30d supply, fill #9
  Filled 2023-12-28: qty 30, 30d supply, fill #11

## 2023-01-04 NOTE — Progress Notes (Signed)
Assessment: 1. BPH with obstruction/lower urinary tract symptoms     Plan: I personally reviewed the patient's chart including provider notes, and lab results. Trial of alfuzosin 10 mg daily in place of tamsulosin. Return to office in 6 weeks   Chief Complaint:  Chief Complaint  Patient presents with   Benign Prostatic Hypertrophy    History of Present Illness:  Clinton Newton is a 72 y.o. male who is seen for evaluation of BPH with lower urinary tract symptoms and history of rising PSA. He has previously seen Dr. Irine Seal with [facility] for a rise in his PSA.  His PSA had increased to 2.49 in 2013.  His PSA from 2018 was 1.54. Recent PSA results: 1/24 2.1 2/24 3.0 with 17% free  No family history of prostate cancer.  He reports lower urinary tract symptoms for 2-3 years.  His symptoms include frequency, urgency, nocturia 2-3 times, intermittent stream, hesitancy, and weak stream.  No dysuria or gross hematuria.  He has been managed with tamsulosin 0.4 mg daily with improvement in his symptoms.  He does report ejaculatory side effects with the tamsulosin. IPSS = 26 QOL = 3 today.  Past Medical History:  Past Medical History:  Diagnosis Date   Arthritis    L3-L4   Cataract    Colon polyps    Diverticulitis    GERD (gastroesophageal reflux disease)    Hyperlipidemia    Hypertension     Past Surgical History:  Past Surgical History:  Procedure Laterality Date   APPENDECTOMY     COLONOSCOPY     POLYPECTOMY     PRE-MALIGNANT / BENIGN SKIN LESION EXCISION      Allergies:  No Known Allergies  Family History:  Family History  Problem Relation Age of Onset   Heart disease Father    Colon cancer Neg Hx    Pancreatic cancer Neg Hx    Rectal cancer Neg Hx    Stomach cancer Neg Hx    Esophageal cancer Neg Hx    Colon polyps Neg Hx     Social History:  Social History   Tobacco Use   Smoking status: Never   Smokeless tobacco: Never  Vaping Use    Vaping Use: Never used  Substance Use Topics   Alcohol use: Yes    Alcohol/week: 7.0 standard drinks of alcohol    Types: 7 Glasses of wine per week   Drug use: No    Review of symptoms:  Constitutional:  Negative for unexplained weight loss, night sweats, fever, chills ENT:  Negative for nose bleeds, sinus pain, painful swallowing CV:  Negative for chest pain, shortness of breath, exercise intolerance, palpitations, loss of consciousness Resp:  Negative for cough, wheezing, shortness of breath GI:  Negative for nausea, vomiting, diarrhea, bloody stools GU:  Positives noted in HPI; otherwise negative for gross hematuria, dysuria, urinary incontinence Neuro:  Negative for seizures, poor balance, limb weakness, slurred speech Psych:  Negative for lack of energy, depression, anxiety Endocrine:  Negative for polydipsia, polyuria, symptoms of hypoglycemia (dizziness, hunger, sweating) Hematologic:  Negative for anemia, purpura, petechia, prolonged or excessive bleeding, use of anticoagulants  Allergic:  Negative for difficulty breathing or choking as a result of exposure to anything; no shellfish allergy; no allergic response (rash/itch) to materials, foods  Physical exam: BP (!) 147/79   Pulse 80   Ht '5\' 8"'$  (1.727 m)   Wt 183 lb (83 kg)   BMI 27.83 kg/m  GENERAL APPEARANCE:  Well appearing, well developed, well nourished, NAD HEENT: Atraumatic, Normocephalic, oropharynx clear. NECK: Supple without lymphadenopathy or thyromegaly. LUNGS: Clear to auscultation bilaterally. HEART: Regular Rate and Rhythm without murmurs, gallops, or rubs. ABDOMEN: Soft, non-tender, No Masses. EXTREMITIES: Moves all extremities well.  Without clubbing, cyanosis, or edema. NEUROLOGIC:  Alert and oriented x 3, normal gait, CN II-XII grossly intact.  MENTAL STATUS:  Appropriate. BACK:  Non-tender to palpation.  No CVAT SKIN:  Warm, dry and intact.   GU: Penis:  circumcised Meatus: Normal Scrotum:  normal, no masses Testis: normal without masses bilateral Epididymis: normal Prostate: 40 g, NT, no nodules Rectum: Normal tone,  no masses or tenderness   Results: Results for orders placed or performed in visit on 01/04/23 (from the past 24 hour(s))  Urinalysis, Routine w reflex microscopic     Status: Abnormal   Collection Time: 01/04/23 12:00 AM  Result Value Ref Range   Glucose, UA negative negative mg/dL   Bilirubin, UA negative negative   Ketones, POC UA negative negative mg/dL   Spec Grav, UA 1.015 1.010 - 1.025   Blood, UA negative negative   pH, UA 7.0 5.0 - 8.0   Protein Ur, POC negative negative mg/dL   Urobilinogen, UA 0.2 0.2 or 1.0 E.U./dL   Nitrite, UA Negative Negative   Leukocytes, UA Trace (A) Negative   WBC, Ur, HPF, POC 0-5 0 - 5   RBC, Urine, Miroscopic 0 0 - 2   Epithelial cells, urine 0-10    Casts, Ur, LPF, POC 0    Cast Type 0    Crystals, Ur, HPF, POC 0    Crystal Type 0    Mucus Threads present    Bacteria, UA few    Yeast, UA 0    Trichomonas 0      PVR: 34 ml

## 2023-01-07 ENCOUNTER — Other Ambulatory Visit (HOSPITAL_BASED_OUTPATIENT_CLINIC_OR_DEPARTMENT_OTHER): Payer: Self-pay

## 2023-01-07 ENCOUNTER — Other Ambulatory Visit (HOSPITAL_BASED_OUTPATIENT_CLINIC_OR_DEPARTMENT_OTHER): Payer: Self-pay | Admitting: Nurse Practitioner

## 2023-01-07 MED ORDER — BISOPROLOL FUMARATE 5 MG PO TABS
2.5000 mg | ORAL_TABLET | Freq: Every day | ORAL | 1 refills | Status: DC
  Filled 2023-01-07 – 2023-01-19 (×3): qty 45, 90d supply, fill #0
  Filled 2023-04-12: qty 45, 90d supply, fill #1

## 2023-01-12 ENCOUNTER — Encounter: Payer: Self-pay | Admitting: Gastroenterology

## 2023-01-12 DIAGNOSIS — H2513 Age-related nuclear cataract, bilateral: Secondary | ICD-10-CM | POA: Diagnosis not present

## 2023-01-12 DIAGNOSIS — H52203 Unspecified astigmatism, bilateral: Secondary | ICD-10-CM | POA: Diagnosis not present

## 2023-01-14 ENCOUNTER — Other Ambulatory Visit (HOSPITAL_BASED_OUTPATIENT_CLINIC_OR_DEPARTMENT_OTHER): Payer: Self-pay

## 2023-01-19 ENCOUNTER — Other Ambulatory Visit (HOSPITAL_BASED_OUTPATIENT_CLINIC_OR_DEPARTMENT_OTHER): Payer: Self-pay

## 2023-02-06 ENCOUNTER — Other Ambulatory Visit: Payer: Self-pay | Admitting: Nurse Practitioner

## 2023-02-06 DIAGNOSIS — G4700 Insomnia, unspecified: Secondary | ICD-10-CM

## 2023-02-08 ENCOUNTER — Other Ambulatory Visit: Payer: Self-pay

## 2023-02-08 NOTE — Telephone Encounter (Signed)
Refill request last apt 12/21/22. 

## 2023-02-10 ENCOUNTER — Other Ambulatory Visit (HOSPITAL_BASED_OUTPATIENT_CLINIC_OR_DEPARTMENT_OTHER): Payer: Self-pay

## 2023-02-10 MED ORDER — ZOLPIDEM TARTRATE 5 MG PO TABS
5.0000 mg | ORAL_TABLET | Freq: Every evening | ORAL | 3 refills | Status: DC | PRN
  Filled 2023-02-10: qty 30, 30d supply, fill #0
  Filled 2023-04-12: qty 30, 30d supply, fill #1
  Filled 2023-07-08: qty 30, 30d supply, fill #2

## 2023-02-15 ENCOUNTER — Encounter: Payer: Self-pay | Admitting: Urology

## 2023-02-15 ENCOUNTER — Ambulatory Visit (INDEPENDENT_AMBULATORY_CARE_PROVIDER_SITE_OTHER): Payer: Medicare Other | Admitting: Urology

## 2023-02-15 VITALS — BP 137/81 | HR 96 | Ht 68.0 in | Wt 183.0 lb

## 2023-02-15 DIAGNOSIS — N401 Enlarged prostate with lower urinary tract symptoms: Secondary | ICD-10-CM | POA: Diagnosis not present

## 2023-02-15 DIAGNOSIS — N138 Other obstructive and reflux uropathy: Secondary | ICD-10-CM | POA: Diagnosis not present

## 2023-02-15 LAB — URINALYSIS, ROUTINE W REFLEX MICROSCOPIC
Bilirubin, UA: NEGATIVE
Glucose, UA: NEGATIVE
Ketones, UA: NEGATIVE
Leukocytes,UA: NEGATIVE
Nitrite, UA: NEGATIVE
Protein,UA: NEGATIVE
RBC, UA: NEGATIVE
Specific Gravity, UA: 1.01 (ref 1.005–1.030)
Urobilinogen, Ur: 0.2 mg/dL (ref 0.2–1.0)
pH, UA: 6.5 (ref 5.0–7.5)

## 2023-02-15 NOTE — Progress Notes (Signed)
Assessment: 1. BPH with obstruction/lower urinary tract symptoms     Plan: Continue alfuzosin 10 mg daily. Return to office in 6 months.  Chief Complaint:  Chief Complaint  Patient presents with   Benign Prostatic Hypertrophy    History of Present Illness:  Clinton Newton is a 72 y.o. male who is seen for continued evaluation of BPH with lower urinary tract symptoms and history of rising PSA. He has previously seen Dr. Bjorn Pippin with [facility] for a rise in his PSA.  His PSA had increased to 2.49 in 2013.  His PSA from 2018 was 1.54. Recent PSA results: 1/24 2.1 2/24 3.0 with 17% free  No family history of prostate cancer.  He reported lower urinary tract symptoms for 2-3 years.  His symptoms included frequency, urgency, nocturia 2-3 times, intermittent stream, hesitancy, and weak stream.  No dysuria or gross hematuria.  He had been managed with tamsulosin 0.4 mg daily with improvement in his symptoms.  He reported ejaculatory side effects with the tamsulosin. IPSS = 26 QOL = 3. PVR = 34 ml. He was given a trial of alfuzosin at his visit in 3/24.  He returns today for follow-up.  He continues on alfuzosin.  He has noted a significant improvement in his lower urinary tract symptoms.  He has had decreased frequency, urgency, nocturia, and improved stream.  No side effects from the medication.  No dysuria or gross hematuria. IPSS = 7 QOL = 1 today.   Portions of the above documentation were copied from a prior visit for review purposes only.   Past Medical History:  Past Medical History:  Diagnosis Date   Arthritis    L3-L4   Cataract    Colon polyps    Diverticulitis    GERD (gastroesophageal reflux disease)    Hyperlipidemia    Hypertension     Past Surgical History:  Past Surgical History:  Procedure Laterality Date   APPENDECTOMY     COLONOSCOPY     POLYPECTOMY     PRE-MALIGNANT / BENIGN SKIN LESION EXCISION      Allergies:  No Known  Allergies  Family History:  Family History  Problem Relation Age of Onset   Heart disease Father    Colon cancer Neg Hx    Pancreatic cancer Neg Hx    Rectal cancer Neg Hx    Stomach cancer Neg Hx    Esophageal cancer Neg Hx    Colon polyps Neg Hx     Social History:  Social History   Tobacco Use   Smoking status: Never   Smokeless tobacco: Never  Vaping Use   Vaping Use: Never used  Substance Use Topics   Alcohol use: Yes    Alcohol/week: 7.0 standard drinks of alcohol    Types: 7 Glasses of wine per week   Drug use: No    ROS: Constitutional:  Negative for fever, chills, weight loss CV: Negative for chest pain, previous MI, hypertension Respiratory:  Negative for shortness of breath, wheezing, sleep apnea, frequent cough GI:  Negative for nausea, vomiting, bloody stool, GERD  Physical exam: BP 137/81   Pulse 96   Ht  (1.727 m)   Wt 183 lb (83 kg)   BMI 27.83 kg/m  GENERAL APPEARANCE:  Well appearing, well developed, well nourished, NAD HEENT:  Atraumatic, normocephalic, oropharynx clear NECK:  Supple without lymphadenopathy or thyromegaly ABDOMEN:  Soft, non-tender, no masses EXTREMITIES:  Moves all extremities well, without clubbing, cyanosis, or edema  NEUROLOGIC:  Alert and oriented x 3, normal gait, CN II-XII grossly intact MENTAL STATUS:  appropriate BACK:  Non-tender to palpation, No CVAT SKIN:  Warm, dry, and intact   Results: U/A:  negative

## 2023-03-06 ENCOUNTER — Encounter: Payer: Self-pay | Admitting: Nurse Practitioner

## 2023-03-13 ENCOUNTER — Other Ambulatory Visit (HOSPITAL_BASED_OUTPATIENT_CLINIC_OR_DEPARTMENT_OTHER): Payer: Self-pay | Admitting: Nurse Practitioner

## 2023-03-13 DIAGNOSIS — E782 Mixed hyperlipidemia: Secondary | ICD-10-CM

## 2023-03-14 ENCOUNTER — Other Ambulatory Visit (HOSPITAL_BASED_OUTPATIENT_CLINIC_OR_DEPARTMENT_OTHER): Payer: Self-pay

## 2023-03-15 ENCOUNTER — Other Ambulatory Visit (HOSPITAL_BASED_OUTPATIENT_CLINIC_OR_DEPARTMENT_OTHER): Payer: Self-pay

## 2023-03-15 ENCOUNTER — Other Ambulatory Visit: Payer: Self-pay

## 2023-03-15 MED ORDER — ROSUVASTATIN CALCIUM 20 MG PO TABS
20.0000 mg | ORAL_TABLET | Freq: Every day | ORAL | 1 refills | Status: DC
  Filled 2023-03-15: qty 90, 90d supply, fill #0
  Filled 2023-10-28: qty 90, 90d supply, fill #1

## 2023-04-12 ENCOUNTER — Other Ambulatory Visit (HOSPITAL_BASED_OUTPATIENT_CLINIC_OR_DEPARTMENT_OTHER): Payer: Self-pay

## 2023-04-21 ENCOUNTER — Encounter: Payer: Self-pay | Admitting: Nurse Practitioner

## 2023-04-22 ENCOUNTER — Encounter: Payer: Self-pay | Admitting: Nurse Practitioner

## 2023-05-20 ENCOUNTER — Other Ambulatory Visit: Payer: Self-pay | Admitting: Nurse Practitioner

## 2023-05-20 ENCOUNTER — Other Ambulatory Visit: Payer: Self-pay

## 2023-05-20 ENCOUNTER — Other Ambulatory Visit (HOSPITAL_BASED_OUTPATIENT_CLINIC_OR_DEPARTMENT_OTHER): Payer: Self-pay

## 2023-05-20 MED ORDER — AMLODIPINE BESYLATE 10 MG PO TABS
10.0000 mg | ORAL_TABLET | Freq: Every day | ORAL | 1 refills | Status: DC
  Filled 2023-05-20: qty 90, 90d supply, fill #0
  Filled 2023-08-18: qty 90, 90d supply, fill #1

## 2023-06-23 DIAGNOSIS — Z23 Encounter for immunization: Secondary | ICD-10-CM | POA: Diagnosis not present

## 2023-06-26 ENCOUNTER — Encounter: Payer: Self-pay | Admitting: Nurse Practitioner

## 2023-07-12 ENCOUNTER — Other Ambulatory Visit (HOSPITAL_BASED_OUTPATIENT_CLINIC_OR_DEPARTMENT_OTHER): Payer: Self-pay | Admitting: Nurse Practitioner

## 2023-07-12 ENCOUNTER — Other Ambulatory Visit (HOSPITAL_BASED_OUTPATIENT_CLINIC_OR_DEPARTMENT_OTHER): Payer: Self-pay

## 2023-07-12 ENCOUNTER — Other Ambulatory Visit: Payer: Self-pay

## 2023-07-12 MED ORDER — BISOPROLOL FUMARATE 5 MG PO TABS
2.5000 mg | ORAL_TABLET | Freq: Every day | ORAL | 1 refills | Status: DC
  Filled 2023-07-12: qty 45, 90d supply, fill #0
  Filled 2023-09-22: qty 45, 90d supply, fill #1

## 2023-07-13 ENCOUNTER — Other Ambulatory Visit (HOSPITAL_BASED_OUTPATIENT_CLINIC_OR_DEPARTMENT_OTHER): Payer: Self-pay

## 2023-08-10 NOTE — Progress Notes (Signed)
Erroneous encounter

## 2023-08-17 ENCOUNTER — Encounter: Payer: Self-pay | Admitting: Urology

## 2023-08-17 ENCOUNTER — Ambulatory Visit (INDEPENDENT_AMBULATORY_CARE_PROVIDER_SITE_OTHER): Payer: Medicare Other | Admitting: Urology

## 2023-08-17 VITALS — BP 147/82 | HR 91 | Ht 68.0 in | Wt 191.0 lb

## 2023-08-17 DIAGNOSIS — N401 Enlarged prostate with lower urinary tract symptoms: Secondary | ICD-10-CM

## 2023-08-17 DIAGNOSIS — N138 Other obstructive and reflux uropathy: Secondary | ICD-10-CM | POA: Diagnosis not present

## 2023-08-17 NOTE — Addendum Note (Signed)
Addended by: Lizbeth Bark on: 08/17/2023 10:58 AM   Modules accepted: Orders

## 2023-08-17 NOTE — Progress Notes (Signed)
Assessment: 1. BPH with obstruction/lower urinary tract symptoms     Plan: Continue alfuzosin 10 mg daily. Annual PSA per PCP Return to office in 1 year  Chief Complaint:  Chief Complaint  Patient presents with   Benign Prostatic Hypertrophy    History of Present Illness:  Clinton Newton is a 72 y.o. male who is seen for continued evaluation of BPH with lower urinary tract symptoms and history of rising PSA. He has previously seen Dr. Bjorn Pippin with [facility] for a rise in his PSA.  His PSA had increased to 2.49 in 2013.  His PSA from 2018 was 1.54. Recent PSA results: 1/24 2.1 2/24 3.0 with 17% free  No family history of prostate cancer.  He reported lower urinary tract symptoms for 2-3 years.  His symptoms included frequency, urgency, nocturia 2-3 times, intermittent stream, hesitancy, and weak stream.  No dysuria or gross hematuria.  He had been managed with tamsulosin 0.4 mg daily with improvement in his symptoms.  He reported ejaculatory side effects with the tamsulosin. IPSS = 26 QOL = 3. PVR = 34 ml. He was given a trial of alfuzosin at his visit in 3/24. At his visit in April 2024, he continued on alfuzosin.  He noted a significant improvement in his lower urinary tract symptoms with decreased frequency, urgency, nocturia, and improved stream.  No side effects from the medication.  No dysuria or gross hematuria. IPSS = 7 QOL = 1.  He returns today for follow-up.  He continues on alfuzosin.  He reports that his lower urinary tract symptoms are well-controlled with the medication.  No side effects.  No dysuria or gross hematuria.  He is overall very pleased with his current situation. IPSS = 5 QOL = 2 today.   Portions of the above documentation were copied from a prior visit for review purposes only.   Past Medical History:  Past Medical History:  Diagnosis Date   Arthritis    L3-L4   Cataract    Colon polyps    Diverticulitis    GERD (gastroesophageal  reflux disease)    Hyperlipidemia    Hypertension     Past Surgical History:  Past Surgical History:  Procedure Laterality Date   APPENDECTOMY     COLONOSCOPY     POLYPECTOMY     PRE-MALIGNANT / BENIGN SKIN LESION EXCISION      Allergies:  No Known Allergies  Family History:  Family History  Problem Relation Age of Onset   Heart disease Father    Colon cancer Neg Hx    Pancreatic cancer Neg Hx    Rectal cancer Neg Hx    Stomach cancer Neg Hx    Esophageal cancer Neg Hx    Colon polyps Neg Hx     Social History:  Social History   Tobacco Use   Smoking status: Never   Smokeless tobacco: Never  Vaping Use   Vaping status: Never Used  Substance Use Topics   Alcohol use: Yes    Alcohol/week: 7.0 standard drinks of alcohol    Types: 7 Glasses of wine per week   Drug use: No    ROS: Constitutional:  Negative for fever, chills, weight loss CV: Negative for chest pain, previous MI, hypertension Respiratory:  Negative for shortness of breath, wheezing, sleep apnea, frequent cough GI:  Negative for nausea, vomiting, bloody stool, GERD  Physical exam: BP (!) 147/82   Pulse 91   Ht 5\' 8"  (1.727 m)  Wt 191 lb (86.6 kg)   BMI 29.04 kg/m  GENERAL APPEARANCE:  Well appearing, well developed, well nourished, NAD HEENT:  Atraumatic, normocephalic, oropharynx clear NECK:  Supple without lymphadenopathy or thyromegaly ABDOMEN:  Soft, non-tender, no masses EXTREMITIES:  Moves all extremities well, without clubbing, cyanosis, or edema NEUROLOGIC:  Alert and oriented x 3, normal gait, CN II-XII grossly intact MENTAL STATUS:  appropriate BACK:  Non-tender to palpation, No CVAT SKIN:  Warm, dry, and intact   Results: no specimen provided

## 2023-08-20 ENCOUNTER — Encounter: Payer: Self-pay | Admitting: Nurse Practitioner

## 2023-09-22 ENCOUNTER — Other Ambulatory Visit (HOSPITAL_BASED_OUTPATIENT_CLINIC_OR_DEPARTMENT_OTHER): Payer: Self-pay

## 2023-09-22 ENCOUNTER — Other Ambulatory Visit: Payer: Self-pay | Admitting: Nurse Practitioner

## 2023-09-22 ENCOUNTER — Other Ambulatory Visit: Payer: Self-pay

## 2023-09-22 DIAGNOSIS — G4700 Insomnia, unspecified: Secondary | ICD-10-CM

## 2023-09-22 MED ORDER — ZOLPIDEM TARTRATE 5 MG PO TABS
5.0000 mg | ORAL_TABLET | Freq: Every evening | ORAL | 3 refills | Status: DC | PRN
  Filled 2023-09-22: qty 30, 30d supply, fill #0
  Filled 2023-12-28: qty 30, 30d supply, fill #1

## 2023-09-22 NOTE — Telephone Encounter (Signed)
Last apt 12/21/22.

## 2023-10-30 ENCOUNTER — Telehealth: Payer: Medicare Other | Admitting: Family Medicine

## 2023-10-30 ENCOUNTER — Other Ambulatory Visit (HOSPITAL_BASED_OUTPATIENT_CLINIC_OR_DEPARTMENT_OTHER): Payer: Self-pay

## 2023-10-30 DIAGNOSIS — J111 Influenza due to unidentified influenza virus with other respiratory manifestations: Secondary | ICD-10-CM | POA: Diagnosis not present

## 2023-10-30 MED ORDER — OSELTAMIVIR PHOSPHATE 75 MG PO CAPS
75.0000 mg | ORAL_CAPSULE | Freq: Two times a day (BID) | ORAL | 0 refills | Status: DC
  Filled 2023-10-30: qty 10, 5d supply, fill #0

## 2023-10-30 NOTE — Progress Notes (Signed)

## 2023-11-03 ENCOUNTER — Encounter: Payer: Self-pay | Admitting: Nurse Practitioner

## 2023-11-04 ENCOUNTER — Telehealth (INDEPENDENT_AMBULATORY_CARE_PROVIDER_SITE_OTHER): Payer: Medicare Other | Admitting: Medical

## 2023-11-04 ENCOUNTER — Other Ambulatory Visit (HOSPITAL_BASED_OUTPATIENT_CLINIC_OR_DEPARTMENT_OTHER): Payer: Self-pay

## 2023-11-04 VITALS — BP 113/67 | HR 80 | Wt 195.0 lb

## 2023-11-04 DIAGNOSIS — J988 Other specified respiratory disorders: Secondary | ICD-10-CM | POA: Diagnosis not present

## 2023-11-04 DIAGNOSIS — R058 Other specified cough: Secondary | ICD-10-CM | POA: Diagnosis not present

## 2023-11-04 DIAGNOSIS — J3489 Other specified disorders of nose and nasal sinuses: Secondary | ICD-10-CM

## 2023-11-04 MED ORDER — HYDROCODONE BIT-HOMATROP MBR 5-1.5 MG/5ML PO SOLN
5.0000 mL | Freq: Three times a day (TID) | ORAL | 0 refills | Status: AC | PRN
  Filled 2023-11-04: qty 75, 5d supply, fill #0

## 2023-11-04 MED ORDER — CEFUROXIME AXETIL 500 MG PO TABS
500.0000 mg | ORAL_TABLET | Freq: Two times a day (BID) | ORAL | 0 refills | Status: AC
  Filled 2023-11-04: qty 12, 6d supply, fill #0

## 2023-11-04 NOTE — Progress Notes (Signed)
 Subjective:     Patient ID: Clinton Newton, male   DOB: Feb 15, 1951, 73 y.o.   MRN: [id number]  This visit type was conducted due to national recommendations for restrictions regarding the COVID-19 Pandemic (e.g. social distancing) in an effort to limit this patient's exposure and mitigate transmission in our community.  Due to their co-morbid illnesses, this patient is at least at moderate risk for complications without adequate follow up.  This format is felt to be most appropriate for this patient at this time.    Documentation for virtual audio and video telecommunications through [place] encounter:  The patient was located at home. The provider was located in the office. The patient did consent to this visit and is aware of possible charges through their insurance for this visit.  The other persons participating in this telemedicine service were none. Time spent on call was 20 minutes and in review of previous records 20 minutes total.  This virtual service is not related to other E/M service within previous 7 days.   HPI Chief Complaint  Patient presents with   Cough    Cough and congestion, home test showed positive Flu A x Friday. Just finished tamiflu . Thought he turned the corner yesterday but today is bad. Having issues with Drainage, and coughing all night   Virtual visit for cough.  Symptoms began 7 days ago.  Still having ongoing symptoms.    He notes doing a home test last week that was + for flu.  Just finished a 5 day course of Tamiflu .  Having a lot of drainage, persistent cough, not getting sleep due to cough.  Had sore throat but that is mild now.  No ear pain.   Has sinus pressure.   No NVD.   No SOB or wheezing.  No fever.  Is getting colored mucous, wet rattly cough.   Has been using some sudafed to help and it seemed to help some.  Has used some OTC mucous relief DM as well.  No other aggravating or relieving factors. No other complaint.   Past Medical History:   Diagnosis Date   Arthritis    L3-L4   Cataract    Colon polyps    Diverticulitis    GERD (gastroesophageal reflux disease)    Hyperlipidemia    Hypertension    Current Outpatient Medications on File Prior to Visit  Medication Sig Dispense Refill   amLODipine  (NORVASC ) 10 MG tablet Take 1 tablet (10 mg total) by mouth daily. 90 tablet 1   ascorbic acid (VITAMIN C) 1000 MG tablet Take 1,000 mg by mouth daily.     bisoprolol  (ZEBETA ) 5 MG tablet Take 1/2 tablet (2.5 mg total) by mouth daily. 45 tablet 1   busPIRone  (BUSPAR ) 7.5 MG tablet Take 1 tablet (7.5 mg total) by mouth 3 (three) times daily. 270 tablet 3   cholecalciferol (VITAMIN D) 1000 UNITS tablet Take 1,000 Units by mouth daily.     Flaxseed, Linseed, (FLAXSEED OIL PO) Take by mouth as directed.     Multiple Vitamin (MULTIVITAMIN) tablet Take 1 tablet by mouth daily.     Omega-3 Fatty Acids (FISH OIL PO) Take by mouth daily.     omeprazole (PRILOSEC) 10 MG capsule      Probiotic Product (PROBIOTIC PO) Take by mouth daily.     rosuvastatin  (CRESTOR ) 20 MG tablet Take 1 tablet (20 mg total) by mouth daily. 90 tablet 1   vitamin B-12 (CYANOCOBALAMIN) 1000 MCG tablet Take 1,000 mcg by  mouth daily.     zolpidem  (AMBIEN ) 5 MG tablet Take 1 tablet (5 mg total) by mouth at bedtime as needed. for sleep 30 tablet 3   alfuzosin  (UROXATRAL ) 10 MG 24 hr tablet Take 1 tablet (10 mg total) by mouth daily. 30 tablet 11   No current facility-administered medications on file prior to visit.    Review of Systems As in subjective    Objective:   Physical Exam Due to coronavirus pandemic stay at home measures, patient visit was virtual and they were not examined in person.   BP 113/67   Pulse 80   Wt 195 lb (88.5 kg)   BMI 29.65 kg/m   Gen: wd, wn, nad No labored breathing or wheezing      Assessment:     Encounter Diagnoses  Name Primary?   Cough productive of purulent sputum Yes   Respiratory tract infection    Sinus  pressure        Plan:      Given persistent rattly cough, wet cough, purulent mucous, begin antibiotic below.  Cough syrup below for worse cough, but caution on sedation.  Specific home care recommendations today include: Only take over-the-counter (OTC) or prescription medicines for pain, discomfort, or fever as directed by your caregiver.   Decongestant: You may use OTC Guaifenesin (Mucinex plain) for congestion.   Sore throat remedies:  You may use salt water gargles, warm fluids such as coffee or hot tea, or honey/tea/lemon mixture to sooth sore throat pain.  You may use OTC sore throat remedies such as Cepacol lozenges or Chloraseptic spray for sore throat pain. Runny nose and sneezing remedies: You may use OTC antihistamine such as Zyrtec or Benadryl, but caution as these can cause drowsiness.   Pain/fever relief: You may use over-the-counter Tylenol  for pain or fever Drink extra fluids. Fluids help thin the mucus so your sinuses can drain more easily.  Applying either moist heat or ice packs to the sinus areas may help relieve discomfort. Use saline nasal sprays to help moisten your sinuses. The sprays can be found at your local drugstore.   Patient was advised to call or return if worse or not improving in the next few days.    Patient voiced understanding of diagnosis, recommendations, and treatment plan.   Pistol was seen today for cough.  Diagnoses and all orders for this visit:  Cough productive of purulent sputum  Respiratory tract infection  Sinus pressure  Other orders -     HYDROcodone  bit-homatropine (HYCODAN) 5-1.5 MG/5ML syrup; Take 5 mLs by mouth every 8 (eight) hours as needed for up to 5 days for cough. -     cefUROXime  (CEFTIN ) 500 MG tablet; Take 1 tablet (500 mg total) by mouth 2 (two) times daily with a meal for 6 days.  F/u prn

## 2023-11-12 DIAGNOSIS — H903 Sensorineural hearing loss, bilateral: Secondary | ICD-10-CM | POA: Diagnosis not present

## 2023-11-17 ENCOUNTER — Other Ambulatory Visit (HOSPITAL_BASED_OUTPATIENT_CLINIC_OR_DEPARTMENT_OTHER): Payer: Self-pay

## 2023-11-17 ENCOUNTER — Other Ambulatory Visit: Payer: Self-pay

## 2023-11-17 ENCOUNTER — Other Ambulatory Visit: Payer: Self-pay | Admitting: Nurse Practitioner

## 2023-11-17 MED ORDER — AMLODIPINE BESYLATE 10 MG PO TABS
10.0000 mg | ORAL_TABLET | Freq: Every day | ORAL | 1 refills | Status: DC
  Filled 2023-11-17: qty 90, 90d supply, fill #0

## 2023-11-23 ENCOUNTER — Ambulatory Visit: Payer: Medicare Other

## 2023-11-23 ENCOUNTER — Other Ambulatory Visit (HOSPITAL_BASED_OUTPATIENT_CLINIC_OR_DEPARTMENT_OTHER): Payer: Self-pay

## 2023-11-23 DIAGNOSIS — Z0000 Encounter for general adult medical examination without abnormal findings: Secondary | ICD-10-CM

## 2023-11-23 NOTE — Progress Notes (Signed)
Subjective:   Clinton Newton is a 73 y.o. male who presents for Medicare Annual/Subsequent preventive examination.  Visit Complete: Virtual I connected with  Blase Mess on 11/23/23 by a audio enabled telemedicine application and verified that I am speaking with the correct person using two identifiers.  Interactive audio and video telecommunications were attempted between this provider and patient, however failed, due to patient having technical difficulties OR patient did not have access to video capability.  We continued and completed visit with audio only.  Patient Location: Home  Provider Location: Office/Clinic  I discussed the limitations of evaluation and management by telemedicine. The patient expressed understanding and agreed to proceed.  Vital Signs: Because this visit was a virtual/telehealth visit, some criteria may be missing or patient reported. Any vitals not documented were not able to be obtained and vitals that have been documented are patient reported.  Patient Medicare AWV questionnaire was completed by the patient on 11/18/2023; I have confirmed that all information answered by patient is correct and no changes since this date.  Cardiac Risk Factors include: advanced age (>106men, >3 women);dyslipidemia;hypertension;male gender     Objective:    Today's Vitals   There is no height or weight on file to calculate BMI.     11/23/2023    4:11 PM 11/17/2022    8:56 AM 11/09/2020    3:15 PM 02/22/2020    2:00 PM 11/09/2017    8:34 AM 02/05/2017    2:16 PM 12/22/2016    2:07 PM  Advanced Directives  Does Patient Have a Medical Advance Directive? Yes Yes No Yes Yes Yes No  Type of Advance Directive Living will;Healthcare Power of [employer] Power of [place];Living will  Living will;Healthcare Power of [employer] Power of [employer] Power of [place];Living will   Does patient want to make changes to medical advance directive?  Yes (ED -  Information included in AVS)  No - Patient declined     Copy of Healthcare Power of Attorney in Chart? Yes - validated most recent copy scanned in chart (See row information) Yes - validated most recent copy scanned in chart (See row information)  No - copy requested  No - copy requested   Would patient like information on creating a medical advance directive?   No - Patient declined        Current Medications (verified) Outpatient Encounter Medications as of 11/23/2023  Medication Sig   alfuzosin (UROXATRAL) 10 MG 24 hr tablet Take 1 tablet (10 mg total) by mouth daily.   amLODipine (NORVASC) 10 MG tablet Take 1 tablet (10 mg total) by mouth daily.   ascorbic acid (VITAMIN C) 1000 MG tablet Take 1,000 mg by mouth daily.   bisoprolol (ZEBETA) 5 MG tablet Take 1/2 tablet (2.5 mg total) by mouth daily.   busPIRone (BUSPAR) 7.5 MG tablet Take 1 tablet (7.5 mg total) by mouth 3 (three) times daily.   cholecalciferol (VITAMIN D) 1000 UNITS tablet Take 1,000 Units by mouth daily.   Flaxseed, Linseed, (FLAXSEED OIL PO) Take by mouth as directed.   Multiple Vitamin (MULTIVITAMIN) tablet Take 1 tablet by mouth daily.   Omega-3 Fatty Acids (FISH OIL PO) Take by mouth daily.   omeprazole (PRILOSEC) 10 MG capsule    Probiotic Product (PROBIOTIC PO) Take by mouth daily.   rosuvastatin (CRESTOR) 20 MG tablet Take 1 tablet (20 mg total) by mouth daily.   vitamin B-12 (CYANOCOBALAMIN) 1000 MCG tablet Take 1,000 mcg by mouth daily.  zolpidem (AMBIEN) 5 MG tablet Take 1 tablet (5 mg total) by mouth at bedtime as needed. for sleep   No facility-administered encounter medications on file as of 11/23/2023.    Allergies (verified) Patient has no known allergies.   History: Past Medical History:  Diagnosis Date   Arthritis    L3-L4   Cataract    Colon polyps    Diverticulitis    GERD (gastroesophageal reflux disease)    Hyperlipidemia    Hypertension    Past Surgical History:  Procedure Laterality  Date   APPENDECTOMY     COLONOSCOPY     POLYPECTOMY     PRE-MALIGNANT / BENIGN SKIN LESION EXCISION     Family History  Problem Relation Age of Onset   Heart disease Father    Colon cancer Neg Hx    Pancreatic cancer Neg Hx    Rectal cancer Neg Hx    Stomach cancer Neg Hx    Esophageal cancer Neg Hx    Colon polyps Neg Hx    Social History   Socioeconomic History   Marital status: Married    Spouse name: Not on file   Number of children: 0   Years of education: Not on file   Highest education level: Some college, no degree  Occupational History   Occupation: Retired  Tobacco Use   Smoking status: Never   Smokeless tobacco: Never  Vaping Use   Vaping status: Never Used  Substance and Sexual Activity   Alcohol use: Yes    Alcohol/week: 7.0 standard drinks of alcohol    Types: 7 Glasses of wine per week   Drug use: No   Sexual activity: Yes  Other Topics Concern   Not on file  Social History Narrative   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, does art - glass, painting, metal, body painting      Home Situation: lives with wife      Lifestyle: working out on a regular basis; diet is healthy         Social Drivers of Corporate investment banker Strain: Low Risk  (11/18/2023)   Overall Financial Resource Strain (CARDIA)    Difficulty of Paying Living Expenses: Not hard at all  Food Insecurity: No Food Insecurity (11/18/2023)   Hunger Vital Sign    Worried About Running Out of Food in the Last Year: Never true    Ran Out of Food in the Last Year: Never true  Transportation Needs: No Transportation Needs (11/18/2023)   PRAPARE - Administrator, Civil Service (Medical): No    Lack of Transportation (Non-Medical): No  Physical Activity: Sufficiently Active (11/18/2023)   Exercise Vital Sign    Days of Exercise per Week: 5 days    Minutes of Exercise per Session: 30 min  Stress: Stress Concern Present (11/18/2023)   [employer]  of Occupational Health - Occupational Stress Questionnaire    Feeling of Stress : To some extent  Social Connections: Moderately Integrated (11/18/2023)   Social Connection and Isolation Panel [NHANES]    Frequency of Communication with Friends and Family: Once a week    Frequency of Social Gatherings with Friends and Family: Three times a week    Attends Religious Services: Never    Active Member of Clubs or Organizations: Yes    Attends Banker Meetings: More than 4 times per year    Marital Status: Married    Tobacco Counseling Counseling  given: Not Answered   Clinical Intake:  Pre-visit preparation completed: Yes  Pain : No/denies pain     Nutritional Risks: None Diabetes: No  How often do you need to have someone help you when you read instructions, pamphlets, or other written materials from your doctor or pharmacy?: 1 - Never  Interpreter Needed?: No  Information entered by :: NAllen LPN   Activities of Daily Living    11/18/2023    1:11 PM  In your present state of health, do you have any difficulty performing the following activities:  Hearing? 0  Vision? 1  Comment cataracts  Difficulty concentrating or making decisions? 0  Walking or climbing stairs? 0  Dressing or bathing? 0  Doing errands, shopping? 0  Preparing Food and eating ? N  Using the Toilet? N  In the past six months, have you accidently leaked urine? N  Do you have problems with loss of bowel control? N  Managing your Medications? N  Managing your Finances? N  Housekeeping or managing your Housekeeping? N    Patient Care Team: Early, Sung Amabile, NP as PCP - General (Nurse Practitioner) Bjorn Pippin, MD as Attending Physician (Urology) Ollen Gross, MD as Consulting Physician (Orthopedic Surgery) Campbell Stall, MD as Consulting Physician (Dermatology) Meryl Dare, MD (Inactive) as Consulting Physician (Gastroenterology) Maris Berger, MD as Consulting Physician  (Ophthalmology)  Indicate any recent Medical Services you may have received from other than [facility] providers in the past year (date may be approximate).     Assessment:   This is a routine wellness examination for [place].  Hearing/Vision screen Hearing Screening - Comments:: Denies hearing issues Vision Screening - Comments:: Regular eye exams, [facility]   Goals Addressed             This Visit's Progress    Patient Stated       11/23/2023, wants to lose 15 pounds       Depression Screen    11/23/2023    4:12 PM 11/17/2022    8:54 AM 05/18/2022    1:13 PM 04/06/2022    1:10 PM 02/18/2022    1:28 PM 02/13/2022    2:35 PM 05/09/2021   10:55 AM  PHQ 2/9 Scores  PHQ - 2 Score 0 0 0 0 0 0 0  PHQ- 9 Score 0  0 0     Exception Documentation   Medical reason Medical reason       Fall Risk    11/18/2023    1:11 PM 11/17/2022    8:54 AM 05/18/2022    1:13 PM 04/06/2022    1:10 PM 02/18/2022    1:28 PM  Fall Risk   Falls in the past year? 0 0 0 0 0  Number falls in past yr: 0 0 0 0 0  Injury with Fall? 0 0 0 0 0  Risk for fall due to : Medication side effect No Fall Risks No Fall Risks No Fall Risks No Fall Risks  Follow up Falls prevention discussed;Falls evaluation completed Falls evaluation completed Falls evaluation completed  Falls evaluation completed    MEDICARE RISK AT HOME: Medicare Risk at Home Any stairs in or around the home?: (Patient-Rptd) Yes If so, are there any without handrails?: (Patient-Rptd) No Home free of loose throw rugs in walkways, pet beds, electrical cords, etc?: (Patient-Rptd) Yes Adequate lighting in your home to reduce risk of falls?: (Patient-Rptd) Yes Life alert?: (Patient-Rptd) No Use of a cane, walker or w/c?: (Patient-Rptd)  No Grab bars in the bathroom?: (Patient-Rptd) No Shower chair or bench in shower?: (Patient-Rptd) Yes Elevated toilet seat or a handicapped toilet?: (Patient-Rptd) No  TIMED UP AND GO:  Was the test performed?   No    Cognitive Function:        11/23/2023    4:13 PM 02/13/2022    2:40 PM 02/22/2020    2:09 PM  6CIT Screen  What Year? 0 points 0 points 0 points  What month? 0 points 0 points 0 points  What time? 0 points 3 points 0 points  Count back from 20 0 points 0 points 0 points  Months in reverse 0 points 0 points 0 points  Repeat phrase 0 points 0 points 0 points  Total Score 0 points 3 points 0 points    Immunizations Immunization History  Administered Date(s) Administered   Fluad Quad(high Dose 65+) 06/23/2019, 07/24/2020   Influenza Split 09/19/2012   Influenza Whole 08/30/2007, 07/11/2010   Influenza, High Dose Seasonal PF 06/23/2018, 06/23/2023   Influenza,inj,Quad PF,6+ Mos 07/18/2013, 06/28/2015, 07/07/2017   Influenza-Unspecified 08/25/2014, 07/07/2016, 06/26/2017, 06/16/2021, 09/17/2022   PFIZER(Purple Top)SARS-COV-2 Vaccination 11/30/2019, 12/21/2019, 06/17/2020   PNEUMOCOCCAL CONJUGATE-20 06/23/2023   Pfizer Covid-19 Vaccine Bivalent Booster 86yrs & up 08/29/2021   Pfizer(Comirnaty)Fall Seasonal Vaccine 12 years and older 01/01/2023, 06/23/2023   Pneumococcal Conjugate-13 01/11/2018   Pneumococcal Polysaccharide-23 06/23/2019   Rsv, Bivalent, Protein Subunit Rsvpref,pf (Abrysvo) 04/21/2023   Td 04/28/2007, 09/09/2017   Tdap 01/01/2023   Unspecified SARS-COV-2 Vaccination 09/17/2022   Zoster Recombinant(Shingrix) 05/14/2020, 10/02/2020   Zoster, Live 03/22/2013    TDAP status: Up to date  Flu Vaccine status: Up to date  Pneumococcal vaccine status: Up to date  Covid-19 vaccine status: Completed vaccines  Qualifies for Shingles Vaccine? Yes   Zostavax completed Yes   Shingrix Completed?: Yes  Screening Tests Health Maintenance  Topic Date Due   Medicare Annual Wellness (AWV)  11/22/2024   Colonoscopy  12/29/2029   DTaP/Tdap/Td (4 - Td or Tdap) 12/31/2032   Pneumonia Vaccine 34+ Years old  Completed   INFLUENZA VACCINE  Completed   COVID-19 Vaccine   Completed   Hepatitis C Screening  Completed   Zoster Vaccines- Shingrix  Completed   HPV VACCINES  Aged Out    Health Maintenance  There are no preventive care reminders to display for this patient.   Colorectal cancer screening: Type of screening: Colonoscopy. Completed 12/30/2022. Repeat every 7 years  Lung Cancer Screening: (Low Dose CT Chest recommended if Age 23-80 years, 20 pack-year currently smoking OR have quit w/in 15years.) does not qualify.   Lung Cancer Screening Referral: no  Additional Screening:  Hepatitis C Screening: does qualify; Completed 07/31/2015  Vision Screening: Recommended annual ophthalmology exams for early detection of glaucoma and other disorders of the eye. Is the patient up to date with their annual eye exam?  Yes  Who is the provider or what is the name of the office in which the patient attends annual eye exams? [facility] If pt is not established with a provider, would they like to be referred to a provider to establish care? No .   Dental Screening: Recommended annual dental exams for proper oral hygiene  Diabetic Foot Exam: n/a  Community Resource Referral / Chronic Care Management: CRR required this visit?  No   CCM required this visit?  No     Plan:     I have personally reviewed and noted the following in the patient's chart:  Medical and social history Use of alcohol, tobacco or illicit drugs  Current medications and supplements including opioid prescriptions. Patient is not currently taking opioid prescriptions. Functional ability and status Nutritional status Physical activity Advanced directives List of other physicians Hospitalizations, surgeries, and ER visits in previous 12 months Vitals Screenings to include cognitive, depression, and falls Referrals and appointments  In addition, I have reviewed and discussed with patient certain preventive protocols, quality metrics, and best practice recommendations. A  written personalized care plan for preventive services as well as general preventive health recommendations were provided to patient.     Barb Merino, LPN   1/61/0960   After Visit Summary: (MyChart) Due to this being a telephonic visit, the after visit summary with patients personalized plan was offered to patient via MyChart   Nurse Notes: none

## 2023-11-23 NOTE — Patient Instructions (Signed)
Mr. Torbeck , Thank you for taking time to come for your Medicare Wellness Visit. I appreciate your ongoing commitment to your health goals. Please review the following plan we discussed and let me know if I can assist you in the future.   Referrals/Orders/Follow-Ups/Clinician Recommendations: none  This is a list of the screening recommended for you and due dates:  Health Maintenance  Topic Date Due   Medicare Annual Wellness Visit  11/22/2024   Colon Cancer Screening  12/29/2029   DTaP/Tdap/Td vaccine (4 - Td or Tdap) 12/31/2032   Pneumonia Vaccine  Completed   Flu Shot  Completed   COVID-19 Vaccine  Completed   Hepatitis C Screening  Completed   Zoster (Shingles) Vaccine  Completed   HPV Vaccine  Aged Out    Advanced directives: (In Chart) A copy of your advanced directives are scanned into your chart should your provider ever need it.  Next Medicare Annual Wellness Visit scheduled for next year: Yes  insert Preventive Care attachment Insert FALL PREVENTION attachment if needed

## 2023-12-01 ENCOUNTER — Encounter: Payer: Self-pay | Admitting: Nurse Practitioner

## 2023-12-14 ENCOUNTER — Ambulatory Visit (INDEPENDENT_AMBULATORY_CARE_PROVIDER_SITE_OTHER): Payer: Medicare Other | Admitting: Nurse Practitioner

## 2023-12-14 ENCOUNTER — Other Ambulatory Visit (HOSPITAL_BASED_OUTPATIENT_CLINIC_OR_DEPARTMENT_OTHER): Payer: Self-pay

## 2023-12-14 ENCOUNTER — Encounter: Payer: Self-pay | Admitting: Nurse Practitioner

## 2023-12-14 VITALS — BP 132/86 | HR 79 | Wt 200.0 lb

## 2023-12-14 DIAGNOSIS — Z6830 Body mass index (BMI) 30.0-30.9, adult: Secondary | ICD-10-CM | POA: Diagnosis not present

## 2023-12-14 DIAGNOSIS — I77810 Thoracic aortic ectasia: Secondary | ICD-10-CM | POA: Diagnosis not present

## 2023-12-14 DIAGNOSIS — N401 Enlarged prostate with lower urinary tract symptoms: Secondary | ICD-10-CM

## 2023-12-14 DIAGNOSIS — G4700 Insomnia, unspecified: Secondary | ICD-10-CM

## 2023-12-14 DIAGNOSIS — E782 Mixed hyperlipidemia: Secondary | ICD-10-CM | POA: Diagnosis not present

## 2023-12-14 DIAGNOSIS — K649 Unspecified hemorrhoids: Secondary | ICD-10-CM | POA: Insufficient documentation

## 2023-12-14 DIAGNOSIS — N3943 Post-void dribbling: Secondary | ICD-10-CM

## 2023-12-14 DIAGNOSIS — I10 Essential (primary) hypertension: Secondary | ICD-10-CM | POA: Diagnosis not present

## 2023-12-14 DIAGNOSIS — N138 Other obstructive and reflux uropathy: Secondary | ICD-10-CM

## 2023-12-14 DIAGNOSIS — R739 Hyperglycemia, unspecified: Secondary | ICD-10-CM

## 2023-12-14 DIAGNOSIS — R7982 Elevated C-reactive protein (CRP): Secondary | ICD-10-CM | POA: Insufficient documentation

## 2023-12-14 MED ORDER — HYDROCORTISONE ACETATE 25 MG RE SUPP
25.0000 mg | Freq: Two times a day (BID) | RECTAL | 4 refills | Status: DC | PRN
  Filled 2023-12-14: qty 24, 12d supply, fill #0

## 2023-12-14 NOTE — Progress Notes (Signed)
 Clinton Clamp, DNP, AGNP-c [facility]  [address] [place], [place] [address] [phone]  ESTABLISHED PATIENT- Chronic Health and/or Follow-Up Visit  Blood pressure 132/86, pulse 79, weight 200 lb (90.7 kg).    Clinton Newton is a 73 y.o. year old male presenting today for evaluation and management of chronic conditions. He has recently had blood work through functional medicine and would like me to review these labs. "Summary Your recent test results highlight some areas that require further attention. A slightly elevated Mean Corpuscular Hemoglobin (MCH) of 33.1 picograms (pg), although marginal, should be monitored, particularly since you have hypertension and diverticulosis; it is important to maintain optimal blood health. The high-sensitivity C-reactive protein (hs-CRP) level is elevated at 2.5 mg/L, indicating chronic inflammation, which is a concern given your cardiovascular health and digestive issues. Additionally, an elevated glucose level of 102 mg/dL, although mostly controlled as suggested by the Hemoglobin A1c (HbA1c) of 5.5%, requires attention to avoid potential progression to metabolic syndrome. Another notable finding is a lower percentage of free Prostate Specific Antigen (PSA) at 13%, below the reference range, which may necessitate further evaluation. These findings are interconnected with existing conditions such as hypertension and warrant follow-up discussions with your primary care physician."   History of Present Illness Clinton Newton is a 73 year old male who presents for a review of recent lab results and general health concerns.  He is here to discuss recent lab results, which show a slightly elevated mean corpuscular hemoglobin (MCH) at 33.1. He has experienced similar fluctuations in the past, which have resolved without intervention. His C-reactive protein (CRP) level is 2.5. His fasting glucose is 102, and his A1c is 5.5, indicating stable  blood sugar levels.  He is aware of his benign prostatic hypertrophy and has been monitoring his prostate-specific antigen (PSA) levels, which have remained stable over the years.  He mentions experiencing occasional blood on toilet paper, which he attributes to hemorrhoids. He maintains a high-fiber diet, consuming about 40 grams of fiber daily. He occasionally experiences itching and irritation, which he associates with hemorrhoids.  He is concerned about his weight, which has not decreased significantly despite reducing alcohol intake and maintaining an active lifestyle. He currently weighs 188 pounds, down from his usual 193 pounds. He exercises regularly, including walking 5 to 6 miles a day and performing balance exercises at home.  He takes bisoprolol, half of a 5 mg tablet daily, which affects his heart rate during exercise. He also uses an anti-anxiety medication as needed, typically taking half a tablet when necessary. He has a supply of Ambien but uses it sparingly, as it only provides about three hours of sleep.  He experiences occasional swelling in his feet and ankles, which resolves by morning. 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 He drinks six 10-ounce cups of water daily to support his fiber intake.  He has noticed recurring blockages in his saliva glands, which he can sometimes relieve by applying pressure. He suspects dietary triggers but has not identified specific causes.  All ROS negative with exception of what is listed above.   PHYSICAL EXAM Physical Exam Vitals and nursing note reviewed.  Constitutional:      General: He is not in acute distress.    Appearance: Normal appearance.  HENT:     Head: Normocephalic.  Eyes:     Conjunctiva/sclera: Conjunctivae normal.  Cardiovascular:     Rate and Rhythm: Normal rate and regular rhythm.  Pulses: Normal pulses.     Heart sounds: Normal heart sounds.  Pulmonary:     Effort: Pulmonary  effort is normal.     Breath sounds: Normal breath sounds.  Abdominal:     General: Bowel sounds are normal. There is no distension.     Palpations: Abdomen is soft.     Tenderness: There is no abdominal tenderness. There is no right CVA tenderness, left CVA tenderness or guarding.  Musculoskeletal:     Right lower leg: No edema.     Left lower leg: No edema.  Skin:    General: Skin is warm and dry.     Capillary Refill: Capillary refill takes less than 2 seconds.  Neurological:     Mental Status: He is alert and oriented to person, place, and time.  Psychiatric:        Mood and Affect: Mood normal.      PLAN Problem List Items Addressed This Visit     Essential hypertension   Discussed the relationship between inflammation markers and hypertension. Current management includes bisoprolol, which may affect exercise-induced heart rate and weight loss. Plan to hold bisoprolol for one week to monitor changes, with the option to use it as needed for palpitations. - Hold bisoprolol for one week and monitor heart rate and blood pressure - Use bisoprolol as needed for palpitations - Monitor blood pressure and heart rate at home      BPH with obstruction/lower urinary tract symptoms - Primary   PSA levels (2.1 to 2.2 over the past two years) and free PSA above 13% indicate no current concern for prostate cancer. No imaging or biopsy performed previously. A PSA level above 4 or a significant jump would warrant further investigation. - Continue monitoring PSA levels annually      CRP elevated   CRP level on outside labs at 2.5, indicating mild inflammation. Potential causes include arthritis, dietary factors, or BPH. No immediate concern as levels are not significantly elevated. CRP levels in the twenties or higher would be more concerning for significant inflammation or cardiovascular risk. - Monitor CRP levels in future blood tests - Work to keep cardiovascular risks low       Hemorrhoids   Intermittent rectal bleeding associated with bowel movements, likely due to hemorrhoids. No signs of severe bleeding or other concerning symptoms. Discussed use of suppositories to manage symptoms and reduce irritation. - Prescribe suppositories for hemorrhoid management      Relevant Medications   hydrocortisone (ANUSOL-HC) 25 MG suppository   BMI 30.0-30.9,adult   Current weight at 188 lbs, down from 193 lbs. Despite efforts to reduce alcohol intake and maintain high physical activity, weight loss has plateaued. Discussed potential impact of bisoprolol on heart rate and weight loss. Regular exercise includes treadmill work and balance routines. - Continue current exercise regimen - Monitor weight and dietary intake - Reassess weight management plan if no further weight loss      Elevated blood sugar   Hyperlipemia   Insomnia   Ascending aorta dilation (HCC)   Other Visit Diagnoses       Benign prostatic hyperplasia with post-void dribbling         General Health Maintenance Overall health appears well-managed with no significant concerns from recent blood work. Discussed importance of regular physical exams and monitoring of chronic conditions. Maintains a high fiber diet and adequate water intake. - Schedule regular physical exam - Continue high fiber diet - Monitor for any new symptoms or changes  in health  Follow-up - Follow up in one year for PSA and blood work - Monitor heart rate and blood pressure at home - Schedule regular physical exam.  Return if symptoms worsen or fail to improve, for CPE.  Clinton Clamp, DNP, AGNP-c

## 2023-12-14 NOTE — Assessment & Plan Note (Signed)
 Current weight at 188 lbs, down from 193 lbs. Despite efforts to reduce alcohol intake and maintain high physical activity, weight loss has plateaued. Discussed potential impact of bisoprolol on heart rate and weight loss. Regular exercise includes treadmill work and balance routines. - Continue current exercise regimen - Monitor weight and dietary intake - Reassess weight management plan if no further weight loss

## 2023-12-14 NOTE — Assessment & Plan Note (Signed)
 PSA levels (2.1 to 2.2 over the past two years) and free PSA above 13% indicate no current concern for prostate cancer. No imaging or biopsy performed previously. A PSA level above 4 or a significant jump would warrant further investigation. - Continue monitoring PSA levels annually

## 2023-12-14 NOTE — Assessment & Plan Note (Signed)
 Intermittent rectal bleeding associated with bowel movements, likely due to hemorrhoids. No signs of severe bleeding or other concerning symptoms. Discussed use of suppositories to manage symptoms and reduce irritation. - Prescribe suppositories for hemorrhoid management

## 2023-12-14 NOTE — Patient Instructions (Addendum)
 Hold the bisoprolol for the next 1-2 weeks and see if you notice a difference in your heart rate and blood pressure. Also watch for the recurrence of palpitations. If you don't have any symptoms with stopping and your blood pressures stay well controlled (less than 130/80 on average) then you can continue to hold this.     MCH:  Higher MCH (Mean Corpuscular Hemoglobin) means that each red blood cell has more hemoglobin, which helps carry oxygen in your blood. When [facility] is elevated, it can sometimes indicate an issue with your red blood cells, like anemia or dehydration. This can also be a transient finding that resolves on its own. We review this information in conjunction with other findings in the labs such as hemoglobin, hematocrit, MCV, and iron studies to get the full picture.   In relation to hypertension (high blood pressure), there isn't a direct cause-and-effect relationship, but there are a few ways they could be connected. Some studies suggest that if you have higher MCH, it might be related to changes in how your blood flows or your body's ability to manage oxygen, which could put extra stress on the heart and blood vessels--possibly influencing blood pressure. This would most be expected when these levels are very high.   Additionally, both conditions might be linked to other factors like inflammation or chronic health issues, such as kidney disease or heart disease, which could affect both MCH levels and blood pressure.  Your levels were "marginal" meaning on the border between normal and just slight elevation. Right now I am not concerned about these levels, but we will keep an eye on them.   CRP: CRP is a substance made by the liver in response to inflammation somewhere in the body. It is a generic marker for inflammation and we typically don't routinely monitor this unless there is a reason to suspect chronic inflammation or monitor the effects of medication to control known  inflammation.   An elevated CRP can occur during periods of illness or infection, in addition to chronic inflammatory processes.   High levels of CRP are often linked to an increased risk of heart disease. Inflammation plays a big role in the development of atherosclerosis, which is when plaque builds up in the arteries, narrowing them and making it harder for blood to flow. This can be directly related to cholesterol levels, but we do see high CRP in people who do not have high cholesterol, as well. This can eventually lead to conditions like heart attacks or strokes.  Our goal for you is to make sure we keep your cholesterol and blood pressures well controlled to reduce this risk.   In diverticulosis, elevated CRP can indicate inflamed diverticula. We would expect you to be experiencing other symptoms if this were the case, however, so this is less likely a concern.   We manage generalized elevation in CRP levels by eating a healthy diet, exercise, weight management, stress management, and controlling chronic conditions. Getting enough sleep is also VERY important in the management of inflammation in the body.   Anti-inflammatory foods:  Fruits (like berries, cherries, and oranges) Vegetables (especially leafy greens, broccoli, and tomatoes) Whole grains (oats, brown rice, quinoa) Healthy fats (like omega-3 fatty acids found in fish, flaxseeds, and walnuts)  Exercise: Regular physical activity, like walking, jogging, or swimming, can help lower CRP levels. Aim for at least 150 minutes of moderate exercise per week.  Free PSA: A PSA (Prostate-Specific Antigen) level of 13% below the reference  range means that the proportion of the free PSA (the form of PSA that is not bound to proteins in the blood) is lower than what is typically expected for someone your age and health. A higher percentage of free PSA generally indicates a lower risk for prostate issues. This is not the only consideration,  though, we also consider total PSA levels, symptoms, family history, and imaging/further testing on the prostate to make a decision about the meaning of this result for you specifically.   Having known BPH is a risk factor for lower levels of free PSA. If you have not had an evaluation with urology for the BPH, we can set this up for you.   Blood sugar: Hemoglobin A1c is an average of your blood sugar over the last three months. This gives [place] a good indication of how well your body is processing sugar for energy. A level of 5.5% is considered normal. It appears the blood sugar of 102 raised concerns, however, this only gives [place] a snapshot of that one moment in time.  We also take into consideration when you last ate, what you had eaten in the last 24 hours, and your activity levels that day. All of these factors can influence the snapshot of your blood sugar. We will keep an eye on this, but at this time there are no concerns with your blood sugars.

## 2023-12-14 NOTE — Assessment & Plan Note (Signed)
 Discussed the relationship between inflammation markers and hypertension. Current management includes bisoprolol, which may affect exercise-induced heart rate and weight loss. Plan to hold bisoprolol for one week to monitor changes, with the option to use it as needed for palpitations. - Hold bisoprolol for one week and monitor heart rate and blood pressure - Use bisoprolol as needed for palpitations - Monitor blood pressure and heart rate at home

## 2023-12-14 NOTE — Assessment & Plan Note (Signed)
 CRP level on outside labs at 2.5, indicating mild inflammation. Potential causes include arthritis, dietary factors, or BPH. No immediate concern as levels are not significantly elevated. CRP levels in the twenties or higher would be more concerning for significant inflammation or cardiovascular risk. - Monitor CRP levels in future blood tests - Work to keep cardiovascular risks low

## 2023-12-28 ENCOUNTER — Other Ambulatory Visit (HOSPITAL_BASED_OUTPATIENT_CLINIC_OR_DEPARTMENT_OTHER): Payer: Self-pay

## 2023-12-28 ENCOUNTER — Other Ambulatory Visit: Payer: Self-pay

## 2023-12-28 ENCOUNTER — Other Ambulatory Visit (HOSPITAL_BASED_OUTPATIENT_CLINIC_OR_DEPARTMENT_OTHER): Payer: Self-pay | Admitting: Nurse Practitioner

## 2023-12-28 MED ORDER — BISOPROLOL FUMARATE 5 MG PO TABS
2.5000 mg | ORAL_TABLET | Freq: Every day | ORAL | 1 refills | Status: DC
  Filled 2023-12-28: qty 45, 90d supply, fill #0
  Filled 2024-03-21: qty 45, 90d supply, fill #1

## 2024-01-10 DIAGNOSIS — Z23 Encounter for immunization: Secondary | ICD-10-CM | POA: Diagnosis not present

## 2024-01-13 ENCOUNTER — Other Ambulatory Visit (HOSPITAL_BASED_OUTPATIENT_CLINIC_OR_DEPARTMENT_OTHER): Payer: Self-pay

## 2024-01-13 DIAGNOSIS — H52203 Unspecified astigmatism, bilateral: Secondary | ICD-10-CM | POA: Diagnosis not present

## 2024-01-13 DIAGNOSIS — H25043 Posterior subcapsular polar age-related cataract, bilateral: Secondary | ICD-10-CM | POA: Diagnosis not present

## 2024-01-13 DIAGNOSIS — H01005 Unspecified blepharitis left lower eyelid: Secondary | ICD-10-CM | POA: Diagnosis not present

## 2024-01-13 DIAGNOSIS — H01002 Unspecified blepharitis right lower eyelid: Secondary | ICD-10-CM | POA: Diagnosis not present

## 2024-01-13 DIAGNOSIS — D3132 Benign neoplasm of left choroid: Secondary | ICD-10-CM | POA: Diagnosis not present

## 2024-01-13 MED ORDER — PREDNISOLONE ACETATE 1 % OP SUSP
1.0000 [drp] | Freq: Two times a day (BID) | OPHTHALMIC | 1 refills | Status: DC
  Filled 2024-01-13: qty 10, 50d supply, fill #0

## 2024-01-13 MED ORDER — DOXYCYCLINE HYCLATE 100 MG PO TABS
100.0000 mg | ORAL_TABLET | Freq: Two times a day (BID) | ORAL | 0 refills | Status: DC
  Filled 2024-01-13: qty 28, 14d supply, fill #0

## 2024-01-19 ENCOUNTER — Other Ambulatory Visit (HOSPITAL_BASED_OUTPATIENT_CLINIC_OR_DEPARTMENT_OTHER): Payer: Self-pay | Admitting: Nurse Practitioner

## 2024-01-19 ENCOUNTER — Other Ambulatory Visit (HOSPITAL_BASED_OUTPATIENT_CLINIC_OR_DEPARTMENT_OTHER): Payer: Self-pay

## 2024-01-19 DIAGNOSIS — E782 Mixed hyperlipidemia: Secondary | ICD-10-CM

## 2024-01-19 MED ORDER — ROSUVASTATIN CALCIUM 20 MG PO TABS
20.0000 mg | ORAL_TABLET | Freq: Every day | ORAL | 1 refills | Status: DC
  Filled 2024-01-19: qty 90, 90d supply, fill #0
  Filled 2024-04-22: qty 90, 90d supply, fill #1

## 2024-01-26 ENCOUNTER — Other Ambulatory Visit (HOSPITAL_BASED_OUTPATIENT_CLINIC_OR_DEPARTMENT_OTHER): Payer: Self-pay

## 2024-01-26 DIAGNOSIS — H0100B Unspecified blepharitis left eye, upper and lower eyelids: Secondary | ICD-10-CM | POA: Diagnosis not present

## 2024-01-26 DIAGNOSIS — H25043 Posterior subcapsular polar age-related cataract, bilateral: Secondary | ICD-10-CM | POA: Diagnosis not present

## 2024-01-26 DIAGNOSIS — H0100A Unspecified blepharitis right eye, upper and lower eyelids: Secondary | ICD-10-CM | POA: Diagnosis not present

## 2024-01-26 MED ORDER — DOXYCYCLINE HYCLATE 50 MG PO CAPS
50.0000 mg | ORAL_CAPSULE | Freq: Every day | ORAL | 3 refills | Status: DC
  Filled 2024-01-26: qty 30, 30d supply, fill #0

## 2024-02-01 ENCOUNTER — Other Ambulatory Visit: Payer: Self-pay | Admitting: Urology

## 2024-02-01 DIAGNOSIS — N138 Other obstructive and reflux uropathy: Secondary | ICD-10-CM

## 2024-02-06 ENCOUNTER — Encounter: Payer: Self-pay | Admitting: Urology

## 2024-02-07 ENCOUNTER — Other Ambulatory Visit: Payer: Self-pay

## 2024-02-07 ENCOUNTER — Other Ambulatory Visit: Payer: Self-pay | Admitting: Urology

## 2024-02-07 ENCOUNTER — Other Ambulatory Visit (HOSPITAL_BASED_OUTPATIENT_CLINIC_OR_DEPARTMENT_OTHER): Payer: Self-pay

## 2024-02-07 DIAGNOSIS — N138 Other obstructive and reflux uropathy: Secondary | ICD-10-CM

## 2024-02-07 MED ORDER — ALFUZOSIN HCL ER 10 MG PO TB24
10.0000 mg | ORAL_TABLET | Freq: Every day | ORAL | 11 refills | Status: DC
  Filled 2024-02-07: qty 30, 30d supply, fill #0
  Filled 2024-03-04: qty 30, 30d supply, fill #1
  Filled 2024-04-10: qty 30, 30d supply, fill #2
  Filled 2024-05-05: qty 30, 30d supply, fill #3
  Filled 2024-06-05: qty 30, 30d supply, fill #4
  Filled ????-??-??: fill #5

## 2024-02-11 ENCOUNTER — Encounter: Payer: Self-pay | Admitting: Nurse Practitioner

## 2024-02-15 ENCOUNTER — Other Ambulatory Visit (HOSPITAL_BASED_OUTPATIENT_CLINIC_OR_DEPARTMENT_OTHER): Payer: Self-pay

## 2024-02-15 ENCOUNTER — Other Ambulatory Visit: Payer: Self-pay

## 2024-02-15 ENCOUNTER — Ambulatory Visit: Payer: Medicare Other | Admitting: Nurse Practitioner

## 2024-02-15 ENCOUNTER — Encounter: Payer: Self-pay | Admitting: Nurse Practitioner

## 2024-02-15 VITALS — BP 122/74 | HR 68 | Ht 68.0 in | Wt 197.2 lb

## 2024-02-15 DIAGNOSIS — M62838 Other muscle spasm: Secondary | ICD-10-CM

## 2024-02-15 DIAGNOSIS — R739 Hyperglycemia, unspecified: Secondary | ICD-10-CM

## 2024-02-15 DIAGNOSIS — Z0000 Encounter for general adult medical examination without abnormal findings: Secondary | ICD-10-CM

## 2024-02-15 DIAGNOSIS — I10 Essential (primary) hypertension: Secondary | ICD-10-CM | POA: Diagnosis not present

## 2024-02-15 DIAGNOSIS — G4700 Insomnia, unspecified: Secondary | ICD-10-CM | POA: Diagnosis not present

## 2024-02-15 DIAGNOSIS — F419 Anxiety disorder, unspecified: Secondary | ICD-10-CM

## 2024-02-15 DIAGNOSIS — H269 Unspecified cataract: Secondary | ICD-10-CM | POA: Diagnosis not present

## 2024-02-15 DIAGNOSIS — M51362 Other intervertebral disc degeneration, lumbar region with discogenic back pain and lower extremity pain: Secondary | ICD-10-CM | POA: Diagnosis not present

## 2024-02-15 DIAGNOSIS — E782 Mixed hyperlipidemia: Secondary | ICD-10-CM | POA: Diagnosis not present

## 2024-02-15 LAB — LIPID PANEL

## 2024-02-15 MED ORDER — METHOCARBAMOL 750 MG PO TABS
750.0000 mg | ORAL_TABLET | Freq: Three times a day (TID) | ORAL | 1 refills | Status: DC
  Filled 2024-02-15: qty 90, 30d supply, fill #0

## 2024-02-15 MED ORDER — AMLODIPINE BESYLATE 10 MG PO TABS
10.0000 mg | ORAL_TABLET | Freq: Every day | ORAL | 1 refills | Status: DC
  Filled 2024-02-15: qty 90, 90d supply, fill #0
  Filled 2024-04-22: qty 90, 90d supply, fill #1

## 2024-02-15 MED ORDER — ZOLPIDEM TARTRATE 5 MG PO TABS
5.0000 mg | ORAL_TABLET | Freq: Every evening | ORAL | 3 refills | Status: DC | PRN
  Filled 2024-02-15: qty 30, 30d supply, fill #0
  Filled 2024-03-21: qty 30, 30d supply, fill #1
  Filled 2024-05-05: qty 30, 30d supply, fill #2
  Filled 2024-07-19: qty 30, 30d supply, fill #3

## 2024-02-15 NOTE — Progress Notes (Signed)
 BP 122/74   Pulse 68   Ht 5\' 8"  (1.727 m)   Wt 197 lb 3.2 oz (89.4 kg)   BMI 29.98 kg/m    Subjective:    Patient ID: Clinton Newton, male    DOB: January 11, 1951, 73 y.o.   MRN: [id number]  HPI: Clinton Newton is a 73 y.o. male presenting on 02/15/2024 for comprehensive medical examination.   History of Present Illness Clinton Newton is a 73 year old male who presents with a sensation of warmth in the left leg.  He experiences a sensation of warmth in the left calf muscle, which has persisted for three days. This sensation is not physically hot but feels like heat and occurs at night, waking him up. He recalls a similar episode about two years ago, though it is not a regular occurrence. The sensation is irritating but not painful, and there is no associated cramping or pain.  He has a history of chronic low back pain, which is currently stable. An MRI from 2017 showed disc desiccation and space narrowing at L4-L5, possibly involving nerve roots. He has not altered his activity level recently, although he regularly exercises to address balance issues. He denies hip pain, leg cramping, or pain with the warmth sensation. He reports chronic low back pain without recent changes.  For relief, he has tried using Tiger Balm, which was ineffective, and Ambien , which helped him sleep. He takes anti-inflammatories daily, referring to them as 'vitamin I', and uses methocarbamol  occasionally for activities like painting or gardening. He is currently taking his wife's Crestor , doubling the dose since she stopped using it, and has nortriptyline , which he finds ineffective for pain or sleep.  No headaches, dizziness, vision changes, throat fullness, or swallowing difficulties. 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  Pertinent items are noted in HPI.    Most Recent Depression Screen:     02/15/2024    2:04 PM 12/14/2023   10:51  AM 11/23/2023    4:12 PM 11/17/2022    8:54 AM 05/18/2022    1:13 PM  Depression screen PHQ 2/9  Decreased Interest 0 0 0 0 0  Down, Depressed, Hopeless 0 0 0 0 0  PHQ - 2 Score 0 0 0 0 0  Altered sleeping   0  0  Tired, decreased energy   0  0  Change in appetite   0  0  Feeling bad or failure about yourself    0  0  Trouble concentrating   0  0  Moving slowly or fidgety/restless   0  0  Suicidal thoughts   0  0  PHQ-9 Score   0  0  Difficult doing work/chores   Not difficult at all  Not difficult at all   Most Recent Anxiety Screen:    09/07/2019    9:57 AM 06/23/2019   10:10 AM 03/10/2019    8:54 AM 01/05/2019   11:45 AM  GAD 7 : Generalized Anxiety Score  Nervous, Anxious, on Edge 1 1 3 1   Control/stop worrying 1 0 1 1  Worry too much - different things 1 0 1 1  Trouble relaxing 1 0 1 1  Restless 1 0 1 1  Easily annoyed or irritable 0 0 0 0  Afraid - awful might happen 1 0 2 1  Total GAD 7 Score 6 1 9 6   Anxiety Difficulty Not difficult at  all Not difficult at all Not difficult at all    Most Recent Falls Screen:    02/15/2024    2:04 PM 12/14/2023   10:51 AM 11/18/2023    1:11 PM 11/17/2022    8:54 AM 05/18/2022    1:13 PM  Fall Risk   Falls in the past year? 0 0 0 0 0  Number falls in past yr: 0 0 0 0 0  Injury with Fall? 0 0 0 0 0  Risk for fall due to : No Fall Risks No Fall Risks Medication side effect No Fall Risks No Fall Risks  Follow up Falls evaluation completed Falls evaluation completed Falls prevention discussed;Falls evaluation completed Falls evaluation completed Falls evaluation completed    Past medical history, surgical history, medications, allergies, family history and social history reviewed with patient today and changes made to appropriate areas of the chart.  Past Medical History:  Past Medical History:  Diagnosis Date   Arthritis    L3-L4   Cataract    Colon polyps    Diverticulitis    GERD (gastroesophageal reflux disease)     Hyperlipidemia    Hypertension    Medications:  Current Outpatient Medications on File Prior to Visit  Medication Sig   alfuzosin  (UROXATRAL ) 10 MG 24 hr tablet Take 1 tablet (10 mg total) by mouth daily.   ascorbic acid (VITAMIN C) 1000 MG tablet Take 1,000 mg by mouth daily.   bisoprolol  (ZEBETA ) 5 MG tablet Take 1/2 tablet (2.5 mg total) by mouth daily.   busPIRone  (BUSPAR ) 7.5 MG tablet Take 1 tablet (7.5 mg total) by mouth 3 (three) times daily.   cholecalciferol (VITAMIN D) 1000 UNITS tablet Take 1,000 Units by mouth daily.   doxycycline  (VIBRAMYCIN ) 50 MG capsule Take 1 capsule (50 mg total) by mouth daily.   Flaxseed, Linseed, (FLAXSEED OIL PO) Take by mouth as directed.   Multiple Vitamin (MULTIVITAMIN) tablet Take 1 tablet by mouth daily.   Omega-3 Fatty Acids (FISH OIL PO) Take by mouth daily.   omeprazole (PRILOSEC) 10 MG capsule    prednisoLONE  acetate (PRED FORTE ) 1 % ophthalmic suspension Place 1 drop into both eyes 2 (two) times daily.   Probiotic Product (PROBIOTIC PO) Take by mouth daily.   rosuvastatin  (CRESTOR ) 20 MG tablet Take 1 tablet (20 mg total) by mouth daily.   vitamin B-12 (CYANOCOBALAMIN) 1000 MCG tablet Take 1,000 mcg by mouth daily.   hydrocortisone  (ANUSOL -HC) 25 MG suppository Place 1 suppository (25 mg total) rectally 2 (two) times daily as needed for hemorrhoids or anal itching. (Patient not taking: Reported on 02/15/2024)   No current facility-administered medications on file prior to visit.   Surgical History:  Past Surgical History:  Procedure Laterality Date   APPENDECTOMY     COLONOSCOPY     POLYPECTOMY     PRE-MALIGNANT / BENIGN SKIN LESION EXCISION     Allergies:  No Known Allergies Social History:  Social History   Socioeconomic History   Marital status: Married    Spouse name: Not on file   Number of children: 0   Years of education: Not on file   Highest education level: Some college, no degree  Occupational History    Occupation: Retired  Tobacco Use   Smoking status: Never   Smokeless tobacco: Never  Vaping Use   Vaping status: Never Used  Substance and Sexual Activity   Alcohol use: Yes    Alcohol/week: 7.0 standard drinks of alcohol  Types: 7 Glasses of wine per week   Drug use: No   Sexual activity: Yes  Other Topics Concern   Not on file  Social History Narrative   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, does art - glass, painting, metal, body painting      Home Situation: lives with wife      Lifestyle: working out on a regular basis; diet is healthy         Social Drivers of Corporate investment banker Strain: Low Risk  (11/18/2023)   Overall Financial Resource Strain (CARDIA)    Difficulty of Paying Living Expenses: Not hard at all  Food Insecurity: No Food Insecurity (11/18/2023)   Hunger Vital Sign    Worried About Running Out of Food in the Last Year: Never true    Ran Out of Food in the Last Year: Never true  Transportation Needs: No Transportation Needs (11/18/2023)   PRAPARE - Administrator, Civil Service (Medical): No    Lack of Transportation (Non-Medical): No  Physical Activity: Sufficiently Active (11/18/2023)   Exercise Vital Sign    Days of Exercise per Week: 5 days    Minutes of Exercise per Session: 30 min  Stress: Stress Concern Present (11/18/2023)   [employer] of Occupational Health - Occupational Stress Questionnaire    Feeling of Stress : To some extent  Social Connections: Moderately Integrated (11/18/2023)   Social Connection and Isolation Panel [NHANES]    Frequency of Communication with Friends and Family: Once a week    Frequency of Social Gatherings with Friends and Family: Three times a week    Attends Religious Services: Never    Active Member of Clubs or Organizations: Yes    Attends Banker Meetings: More than 4 times per year    Marital Status: Married  Catering manager Violence: Not At Risk  (11/23/2023)   Humiliation, Afraid, Rape, and Kick questionnaire    Fear of Current or Ex-Partner: No    Emotionally Abused: No    Physically Abused: No    Sexually Abused: No   Social History   Tobacco Use  Smoking Status Never  Smokeless Tobacco Never   Social History   Substance and Sexual Activity  Alcohol Use Yes   Alcohol/week: 7.0 standard drinks of alcohol   Types: 7 Glasses of wine per week   Family History:  Family History  Problem Relation Age of Onset   Heart disease Father    Colon cancer Neg Hx    Pancreatic cancer Neg Hx    Rectal cancer Neg Hx    Stomach cancer Neg Hx    Esophageal cancer Neg Hx    Colon polyps Neg Hx        Objective:    BP 122/74   Pulse 68   Ht 5\' 8"  (1.727 m)   Wt 197 lb 3.2 oz (89.4 kg)   BMI 29.98 kg/m   Wt Readings from Last 3 Encounters:  02/15/24 197 lb 3.2 oz (89.4 kg)  12/14/23 200 lb (90.7 kg)  11/04/23 195 lb (88.5 kg)    Physical Exam Vitals and nursing note reviewed.  Constitutional:      Appearance: Normal appearance.  HENT:     Head: Normocephalic and atraumatic.     Right Ear: Hearing, tympanic membrane, ear canal and external ear normal.     Left Ear: Hearing, tympanic membrane, ear canal and external ear normal.  Nose: Nose normal.     Right Sinus: No maxillary sinus tenderness or frontal sinus tenderness.     Left Sinus: No maxillary sinus tenderness or frontal sinus tenderness.     Mouth/Throat:     Lips: Pink.     Mouth: Mucous membranes are moist.     Pharynx: Oropharynx is clear.  Eyes:     General: Lids are normal. Vision grossly intact.     Extraocular Movements: Extraocular movements intact.     Pupils: Pupils are equal, round, and reactive to light.     Funduscopic exam:    Right eye: No hemorrhage. Red reflex present.        Left eye: No hemorrhage. Red reflex present.    Visual Fields: Right eye visual fields normal and left eye visual fields normal.  Neck:     Thyroid : No  thyromegaly.     Vascular: No carotid bruit or JVD.  Cardiovascular:     Rate and Rhythm: Normal rate and regular rhythm.     Chest Wall: PMI is not displaced.     Pulses: Normal pulses.          Dorsalis pedis pulses are 2+ on the right side and 2+ on the left side.       Posterior tibial pulses are 2+ on the right side and 2+ on the left side.     Heart sounds: Normal heart sounds. No murmur heard. Pulmonary:     Effort: Pulmonary effort is normal. No respiratory distress.     Breath sounds: Normal breath sounds.  Chest:  Breasts:    Breasts are symmetrical.  Abdominal:     General: Bowel sounds are normal. There is no distension or abdominal bruit.     Palpations: Abdomen is soft. There is no hepatomegaly, splenomegaly or mass.     Tenderness: There is no abdominal tenderness. There is no right CVA tenderness, left CVA tenderness, guarding or rebound.  Musculoskeletal:        General: Normal range of motion.     Cervical back: Full passive range of motion without pain and neck supple. No tenderness. No spinous process tenderness or muscular tenderness.     Right lower leg: No edema.     Left lower leg: No edema.  Feet:     Right foot:     Toenail Condition: Right toenails are normal.     Left foot:     Toenail Condition: Left toenails are normal.  Lymphadenopathy:     Cervical: No cervical adenopathy.     Upper Body:     Right upper body: No supraclavicular adenopathy.     Left upper body: No supraclavicular adenopathy.  Skin:    General: Skin is warm and dry.     Capillary Refill: Capillary refill takes less than 2 seconds.     Nails: There is no clubbing.  Neurological:     General: No focal deficit present.     Mental Status: He is alert and oriented to person, place, and time.     Cranial Nerves: No cranial nerve deficit.     Sensory: Sensation is intact. No sensory deficit.     Motor: Motor function is intact. No weakness.     Coordination: Coordination is intact.  Coordination normal.     Gait: Gait is intact. Gait normal.  Psychiatric:        Attention and Perception: Attention normal.        Mood and Affect: Mood  normal.        Speech: Speech normal.        Behavior: Behavior normal. Behavior is cooperative.        Cognition and Memory: Cognition and memory normal.      Results for orders placed or performed in visit on 02/15/24  CBC with Differential/Platelet   Collection Time: 02/15/24  9:59 AM  Result Value Ref Range   WBC 4.0 3.4 - 10.8 x10E3/uL   RBC 5.06 4.14 - 5.80 x10E6/uL   Hemoglobin 16.8 13.0 - 17.7 g/dL   Hematocrit 16.1 09.6 - 51.0 %   MCV 95 79 - 97 fL   MCH 33.2 (H) 26.6 - 33.0 pg   MCHC 35.1 31.5 - 35.7 g/dL   RDW 04.5 40.9 - 81.1 %   Platelets 191 150 - 450 x10E3/uL   Neutrophils 42 Not Estab. %   Lymphs 38 Not Estab. %   Monocytes 14 Not Estab. %   Eos 5 Not Estab. %   Basos 1 Not Estab. %   Neutrophils Absolute 1.7 1.4 - 7.0 x10E3/uL   Lymphocytes Absolute 1.5 0.7 - 3.1 x10E3/uL   Monocytes Absolute 0.6 0.1 - 0.9 x10E3/uL   EOS (ABSOLUTE) 0.2 0.0 - 0.4 x10E3/uL   Basophils Absolute 0.0 0.0 - 0.2 x10E3/uL   Immature Granulocytes 0 Not Estab. %   Immature Grans (Abs) 0.0 0.0 - 0.1 x10E3/uL  CMP14+EGFR   Collection Time: 02/15/24  9:59 AM  Result Value Ref Range   Glucose 95 70 - 99 mg/dL   BUN 12 8 - 27 mg/dL   Creatinine, Ser 9.14 0.76 - 1.27 mg/dL   eGFR 96 >78 GN/FAO/1.30   BUN/Creatinine Ratio 16 10 - 24   Sodium 143 134 - 144 mmol/L   Potassium 3.8 3.5 - 5.2 mmol/L   Chloride 105 96 - 106 mmol/L   CO2 24 20 - 29 mmol/L   Calcium  9.4 8.6 - 10.2 mg/dL   Total Protein 7.1 6.0 - 8.5 g/dL   Albumin 4.8 3.8 - 4.8 g/dL   Globulin, Total 2.3 1.5 - 4.5 g/dL   Bilirubin Total 0.6 0.0 - 1.2 mg/dL   Alkaline Phosphatase 69 44 - 121 IU/L   AST 37 0 - 40 IU/L   ALT 41 0 - 44 IU/L  Hemoglobin A1c   Collection Time: 02/15/24  9:59 AM  Result Value Ref Range   Hgb A1c MFr Bld 5.5 4.8 - 5.6 %   Est. average  glucose Bld gHb Est-mCnc 111 mg/dL  Lipid panel   Collection Time: 02/15/24  9:59 AM  Result Value Ref Range   Cholesterol, Total 151 100 - 199 mg/dL   Triglycerides 865 (H) 0 - 149 mg/dL   HDL 60 >78 mg/dL   VLDL Cholesterol Cal 27 5 - 40 mg/dL   LDL Chol Calc (NIH) 64 0 - 99 mg/dL   Chol/HDL Ratio 2.5 0.0 - 5.0 ratio  TSH   Collection Time: 02/15/24  9:59 AM  Result Value Ref Range   TSH 3.290 0.450 - 4.500 uIU/mL      Assessment & Plan:   Problem List Items Addressed This Visit     Hyperlipemia   Managed with rosuvastatin  without concerns at this time. Labs pending.       Relevant Medications   amLODipine  (NORVASC ) 10 MG tablet   Other Relevant Orders   Lipid panel (Completed)   Essential hypertension   Blood pressures very well controlled with no alarm symptoms. Labs  pending. No changes in management at this time.       Relevant Medications   amLODipine  (NORVASC ) 10 MG tablet   Insomnia   Controlled with ambien  5mg . No concerning symptoms.       Relevant Medications   zolpidem  (AMBIEN ) 5 MG tablet   Lumbar degenerative disc disease   Chronic low back pain with intermittent nerve-related leg irritation, likely due to L4-L5 nerve root irritation. Recent sensation of warmth in the left leg. Previous MRI from 2017 showed disc desiccation and narrowing at L4-L5 with possible nerve root compression. Symptoms are mild but disrupt sleep. Gabapentin  and steroids discussed as potential treatments if symptoms persist. - Consider trial of gabapentin  for nerve-related irritation if symptoms persist. - Consider short course of steroids if inflammation is suspected to be causing nerve irritation. - Advise to monitor symptoms and report if he worsens or persists beyond a week. - Try methocarbamol  for muscle relaxation if needed.       Anxiety   Anxiety managed with as-needed use of Buspar , effectively reducing symptoms without significant side effects. He reports satisfaction  with current management.      Elevated blood sugar   Repeat labs today for evaluation. Diet and exercise management.       Relevant Orders   CBC with Differential/Platelet (Completed)   CMP14+EGFR (Completed)   Hemoglobin A1c (Completed)   Cataract of both eyes   Scheduled for cataract surgery in the next two weeks. He expresses excitement and positive outlook based on family experiences.      Other Visit Diagnoses       Annual wellness visit    -  Primary   Relevant Orders   Lipid panel (Completed)   TSH (Completed)     Health care maintenance       Relevant Orders   CBC with Differential/Platelet (Completed)     Muscle spasm       Relevant Medications   methocarbamol  (ROBAXIN ) 750 MG tablet     Primary hypertension       Relevant Medications   amLODipine  (NORVASC ) 10 MG tablet        Follow up plan: NEXT PREVENTATIVE PHYSICAL DUE IN 1 YEAR. Return in about 1 year (around 02/14/2025) for CPE.  LABORATORY TESTING:  Health maintenance labs ordered today, if applicable.    PATIENT COUNSELING:   For all adult patients, I recommend A well balanced diet low in saturated fats, cholesterol, and moderation in carbohydrates.  This can be as simple as monitoring portion sizes and cutting back on sugary beverages such as soda and juice to start with.    Daily water consumption of at least 64 ounces.  Physical activity at least 180 minutes per week, if just starting out.  This can be as simple as taking the stairs instead of the elevator and walking 2-3 laps around the office  purposefully every day.   STD protection, partner selection, and regular testing if high risk.  Limited consumption of alcoholic beverages if alcohol is consumed. For men, I recommend no more than 14 alcoholic beverages per week, spread out throughout the week (max 2 per day). Avoid "binge" drinking or consuming large quantities of alcohol in one setting.  Please let me know if you feel you may need  help with reduction or quitting alcohol consumption.   Avoidance of nicotine, if used. Please let me know if you feel you may need help with reduction or quitting nicotine use.   Daily mental health  attention. This can be in the form of 5 minute daily meditation, prayer, journaling, yoga, reflection, etc.  Purposeful attention to your emotions and mental state can significantly improve your overall wellbeing and Health.  Please know that I am here to help you with all of your health care goals and am happy to work with you to find a solution that works best for you.  The greatest advice I have received with any changes in life are to take it one step at a time, that even means if all you can focus on is the next 60 seconds, then do that and celebrate your victories.  With any changes in life, you will have set backs, and that is OK. The important thing to remember is, if you have a set back, it is not a failure, it is an opportunity to try again!  Health Maintenance Recommendations Screening Testing Mammogram Every 1 -2 years based on history and risk factors Starting at age 78 Pap Smear Ages 21-39 every 3 years Ages 15-65 every 5 years with HPV testing More frequent testing may be required based on results and history Colon Cancer Screening Every 1-10 years based on test performed, risk factors, and history Starting at age 36 Bone Density Screening Every 2-10 years based on history Starting at age 14 for women Recommendations for men differ based on medication usage, history, and risk factors AAA Screening One time ultrasound Men 31-68 years old who have every smoked Lung Cancer Screening Low Dose Lung CT every 12 months Age 40-80 years with a 30 pack-year smoking history who still smoke or who have quit within the last 15 years   Screening Labs Routine  Labs: Complete Blood Count (CBC), Complete Metabolic Panel (CMP), Cholesterol (Lipid Panel) Every 6-12 months based on  history and medications May be recommended more frequently based on current conditions or previous results Hemoglobin A1c Lab Every 3-12 months based on history and previous results Starting at age 49 or earlier with diagnosis of diabetes, high cholesterol, BMI >26, and/or risk factors Frequent monitoring for patients with diabetes to ensure blood sugar control Thyroid  Panel (TSH) Every 6 months based on history, symptoms, and risk factors May be repeated more often if on medication HIV One time testing for all patients 72 and older May be repeated more frequently for patients with increased risk factors or exposure Hepatitis C One time testing for all patients 63 and older May be repeated more frequently for patients with increased risk factors or exposure Gonorrhea, Chlamydia Every 12 months for all sexually active persons 13-24 years Additional monitoring may be recommended for those who are considered high risk or who have symptoms Every 12 months for any woman on birth control, regardless of sexual activity PSA Men 50-22 years old with risk factors Additional screening may be recommended from age 1-69 based on risk factors, symptoms, and history  Vaccine Recommendations Tetanus Booster All adults every 10 years Flu Vaccine All patients 6 months and older every year COVID Vaccine All patients 12 years and older Initial dosing with booster May recommend additional booster based on age and health history HPV Vaccine 2 doses all patients age 40-26 Dosing may be considered for patients over 26 Shingles Vaccine (Shingrix) 2 doses all adults 55 years and older Pneumonia (Pneumovax 33) All adults 65 years and older May recommend earlier dosing based on health history One year apart from Prevnar 34 Pneumonia (Prevnar 50) All adults 65 years and older Dosed 1 year after  Pneumovax 23 Pneumonia (Prevnar 20) One time alternative to the two dosing of 13 and 23 For all adults with  initial dose of 23, 20 is recommended 1 year later For all adults with initial dose of 13, 23 is still recommended as second option 1 year later  Additional Screening, Testing, and Vaccinations may be recommended on an individualized basis based on family history, health history, risk factors, and/or exposure.

## 2024-02-16 ENCOUNTER — Encounter: Payer: Self-pay | Admitting: Nurse Practitioner

## 2024-02-16 DIAGNOSIS — M51362 Other intervertebral disc degeneration, lumbar region with discogenic back pain and lower extremity pain: Secondary | ICD-10-CM

## 2024-02-16 DIAGNOSIS — G8929 Other chronic pain: Secondary | ICD-10-CM

## 2024-02-16 DIAGNOSIS — G629 Polyneuropathy, unspecified: Secondary | ICD-10-CM

## 2024-02-16 LAB — CMP14+EGFR
ALT: 41 IU/L (ref 0–44)
AST: 37 IU/L (ref 0–40)
Albumin: 4.8 g/dL (ref 3.8–4.8)
Alkaline Phosphatase: 69 IU/L (ref 44–121)
BUN/Creatinine Ratio: 16 (ref 10–24)
BUN: 12 mg/dL (ref 8–27)
Bilirubin Total: 0.6 mg/dL (ref 0.0–1.2)
CO2: 24 mmol/L (ref 20–29)
Calcium: 9.4 mg/dL (ref 8.6–10.2)
Chloride: 105 mmol/L (ref 96–106)
Creatinine, Ser: 0.76 mg/dL (ref 0.76–1.27)
Globulin, Total: 2.3 g/dL (ref 1.5–4.5)
Glucose: 95 mg/dL (ref 70–99)
Potassium: 3.8 mmol/L (ref 3.5–5.2)
Sodium: 143 mmol/L (ref 134–144)
Total Protein: 7.1 g/dL (ref 6.0–8.5)
eGFR: 96 mL/min/{1.73_m2} (ref >59)

## 2024-02-16 LAB — CBC WITH DIFFERENTIAL/PLATELET
Basophils Absolute: 0 10*3/uL (ref 0.0–0.2)
Basos: 1 %
EOS (ABSOLUTE): 0.2 10*3/uL (ref 0.0–0.4)
Eos: 5 %
Hematocrit: 47.9 % (ref 37.5–51.0)
Hemoglobin: 16.8 g/dL (ref 13.0–17.7)
Immature Grans (Abs): 0 10*3/uL (ref 0.0–0.1)
Immature Granulocytes: 0 %
Lymphocytes Absolute: 1.5 10*3/uL (ref 0.7–3.1)
Lymphs: 38 %
MCH: 33.2 pg — ABNORMAL HIGH (ref 26.6–33.0)
MCHC: 35.1 g/dL (ref 31.5–35.7)
MCV: 95 fL (ref 79–97)
Monocytes Absolute: 0.6 10*3/uL (ref 0.1–0.9)
Monocytes: 14 %
Neutrophils Absolute: 1.7 10*3/uL (ref 1.4–7.0)
Neutrophils: 42 %
Platelets: 191 10*3/uL (ref 150–450)
RBC: 5.06 x10E6/uL (ref 4.14–5.80)
RDW: 13.1 % (ref 11.6–15.4)
WBC: 4 10*3/uL (ref 3.4–10.8)

## 2024-02-16 LAB — HEMOGLOBIN A1C
Est. average glucose Bld gHb Est-mCnc: 111 mg/dL
Hgb A1c MFr Bld: 5.5 % (ref 4.8–5.6)

## 2024-02-16 LAB — LIPID PANEL
Cholesterol, Total: 151 mg/dL (ref 100–199)
HDL: 60 mg/dL (ref >39)
LDL CALC COMMENT:: 2.5 ratio (ref 0.0–5.0)
LDL Chol Calc (NIH): 64 mg/dL (ref 0–99)
Triglycerides: 160 mg/dL — ABNORMAL HIGH (ref 0–149)
VLDL Cholesterol Cal: 27 mg/dL (ref 5–40)

## 2024-02-16 LAB — TSH: TSH: 3.29 u[IU]/mL (ref 0.450–4.500)

## 2024-02-17 ENCOUNTER — Other Ambulatory Visit (HOSPITAL_BASED_OUTPATIENT_CLINIC_OR_DEPARTMENT_OTHER): Payer: Self-pay

## 2024-02-17 MED ORDER — GABAPENTIN 300 MG PO CAPS
ORAL_CAPSULE | ORAL | 5 refills | Status: DC
  Filled 2024-02-17: qty 90, 30d supply, fill #0
  Filled 2024-03-27: qty 90, 30d supply, fill #1
  Filled 2024-05-05: qty 90, 30d supply, fill #2

## 2024-02-23 ENCOUNTER — Encounter: Payer: Self-pay | Admitting: Nurse Practitioner

## 2024-02-23 DIAGNOSIS — H269 Unspecified cataract: Secondary | ICD-10-CM | POA: Insufficient documentation

## 2024-02-23 NOTE — Assessment & Plan Note (Signed)
 Repeat labs today for evaluation. Diet and exercise management.

## 2024-02-23 NOTE — Assessment & Plan Note (Signed)
 Scheduled for cataract surgery in the next two weeks. He expresses excitement and positive outlook based on family experiences.

## 2024-02-23 NOTE — Assessment & Plan Note (Signed)
 Anxiety managed with as-needed use of Buspar , effectively reducing symptoms without significant side effects. He reports satisfaction with current management.

## 2024-02-23 NOTE — Assessment & Plan Note (Signed)
 Controlled with ambien  5mg . No concerning symptoms.

## 2024-02-23 NOTE — Assessment & Plan Note (Signed)
 Managed with rosuvastatin  without concerns at this time. Labs pending.

## 2024-02-23 NOTE — Assessment & Plan Note (Signed)
 Blood pressures very well controlled with no alarm symptoms. Labs pending. No changes in management at this time.

## 2024-02-23 NOTE — Assessment & Plan Note (Signed)
 Chronic low back pain with intermittent nerve-related leg irritation, likely due to L4-L5 nerve root irritation. Recent sensation of warmth in the left leg. Previous MRI from 2017 showed disc desiccation and narrowing at L4-L5 with possible nerve root compression. Symptoms are mild but disrupt sleep. Gabapentin  and steroids discussed as potential treatments if symptoms persist. - Consider trial of gabapentin  for nerve-related irritation if symptoms persist. - Consider short course of steroids if inflammation is suspected to be causing nerve irritation. - Advise to monitor symptoms and report if he worsens or persists beyond a week. - Try methocarbamol  for muscle relaxation if needed.

## 2024-02-25 ENCOUNTER — Encounter: Payer: Self-pay | Admitting: Urology

## 2024-03-02 DIAGNOSIS — Z961 Presence of intraocular lens: Secondary | ICD-10-CM | POA: Diagnosis not present

## 2024-03-02 DIAGNOSIS — H25811 Combined forms of age-related cataract, right eye: Secondary | ICD-10-CM | POA: Diagnosis not present

## 2024-03-02 DIAGNOSIS — H2511 Age-related nuclear cataract, right eye: Secondary | ICD-10-CM | POA: Diagnosis not present

## 2024-03-10 ENCOUNTER — Other Ambulatory Visit (HOSPITAL_BASED_OUTPATIENT_CLINIC_OR_DEPARTMENT_OTHER): Payer: Self-pay

## 2024-03-10 MED ORDER — TIMOLOL MALEATE 0.5 % OP SOLN
1.0000 [drp] | Freq: Two times a day (BID) | OPHTHALMIC | 1 refills | Status: DC
  Filled 2024-03-10: qty 5, 50d supply, fill #0

## 2024-03-14 DIAGNOSIS — M5416 Radiculopathy, lumbar region: Secondary | ICD-10-CM | POA: Diagnosis not present

## 2024-03-21 ENCOUNTER — Other Ambulatory Visit: Payer: Self-pay | Admitting: Nurse Practitioner

## 2024-03-21 DIAGNOSIS — F418 Other specified anxiety disorders: Secondary | ICD-10-CM

## 2024-03-22 ENCOUNTER — Other Ambulatory Visit (HOSPITAL_BASED_OUTPATIENT_CLINIC_OR_DEPARTMENT_OTHER): Payer: Self-pay

## 2024-03-22 ENCOUNTER — Other Ambulatory Visit: Payer: Self-pay

## 2024-03-22 MED ORDER — BUSPIRONE HCL 7.5 MG PO TABS
7.5000 mg | ORAL_TABLET | Freq: Three times a day (TID) | ORAL | 3 refills | Status: DC
  Filled 2024-03-22: qty 270, 90d supply, fill #0
  Filled ????-??-??: fill #1

## 2024-03-22 NOTE — Telephone Encounter (Signed)
 Last apt 01/26/24

## 2024-03-23 ENCOUNTER — Ambulatory Visit: Admitting: Orthopedic Surgery

## 2024-03-23 DIAGNOSIS — H25812 Combined forms of age-related cataract, left eye: Secondary | ICD-10-CM | POA: Diagnosis not present

## 2024-03-23 DIAGNOSIS — H2512 Age-related nuclear cataract, left eye: Secondary | ICD-10-CM | POA: Diagnosis not present

## 2024-04-06 DIAGNOSIS — R202 Paresthesia of skin: Secondary | ICD-10-CM | POA: Diagnosis not present

## 2024-04-06 DIAGNOSIS — M5116 Intervertebral disc disorders with radiculopathy, lumbar region: Secondary | ICD-10-CM | POA: Diagnosis not present

## 2024-04-06 DIAGNOSIS — R29898 Other symptoms and signs involving the musculoskeletal system: Secondary | ICD-10-CM | POA: Diagnosis not present

## 2024-04-06 DIAGNOSIS — R200 Anesthesia of skin: Secondary | ICD-10-CM | POA: Diagnosis not present

## 2024-04-06 DIAGNOSIS — M5416 Radiculopathy, lumbar region: Secondary | ICD-10-CM | POA: Diagnosis not present

## 2024-04-10 ENCOUNTER — Ambulatory Visit: Admitting: Orthopedic Surgery

## 2024-05-01 DIAGNOSIS — M5416 Radiculopathy, lumbar region: Secondary | ICD-10-CM | POA: Diagnosis not present

## 2024-05-08 ENCOUNTER — Other Ambulatory Visit: Payer: Self-pay

## 2024-06-05 ENCOUNTER — Other Ambulatory Visit: Payer: Self-pay

## 2024-06-05 ENCOUNTER — Other Ambulatory Visit (HOSPITAL_BASED_OUTPATIENT_CLINIC_OR_DEPARTMENT_OTHER): Payer: Self-pay | Admitting: Nurse Practitioner

## 2024-06-05 ENCOUNTER — Other Ambulatory Visit (HOSPITAL_BASED_OUTPATIENT_CLINIC_OR_DEPARTMENT_OTHER): Payer: Self-pay

## 2024-06-05 MED ORDER — BISOPROLOL FUMARATE 5 MG PO TABS
2.5000 mg | ORAL_TABLET | Freq: Every day | ORAL | 1 refills | Status: DC
  Filled 2024-06-05: qty 45, 90d supply, fill #0
  Filled 2024-09-06: qty 45, 90d supply, fill #1

## 2024-06-29 ENCOUNTER — Other Ambulatory Visit (HOSPITAL_BASED_OUTPATIENT_CLINIC_OR_DEPARTMENT_OTHER): Payer: Self-pay

## 2024-06-29 DIAGNOSIS — Z23 Encounter for immunization: Secondary | ICD-10-CM | POA: Diagnosis not present

## 2024-06-29 MED ORDER — FLUZONE HIGH-DOSE 0.5 ML IM SUSY
0.5000 mL | PREFILLED_SYRINGE | Freq: Once | INTRAMUSCULAR | 0 refills | Status: AC
  Filled 2024-06-29: qty 0.5, 1d supply, fill #0

## 2024-07-02 ENCOUNTER — Encounter: Payer: Self-pay | Admitting: Urology

## 2024-07-02 ENCOUNTER — Encounter (HOSPITAL_BASED_OUTPATIENT_CLINIC_OR_DEPARTMENT_OTHER): Payer: Self-pay

## 2024-07-02 ENCOUNTER — Other Ambulatory Visit (HOSPITAL_BASED_OUTPATIENT_CLINIC_OR_DEPARTMENT_OTHER): Payer: Self-pay

## 2024-07-02 DIAGNOSIS — N138 Other obstructive and reflux uropathy: Secondary | ICD-10-CM

## 2024-07-02 MED ORDER — ALFUZOSIN HCL ER 10 MG PO TB24
10.0000 mg | ORAL_TABLET | Freq: Every day | ORAL | 3 refills | Status: AC
Start: 2024-07-02 — End: ?
  Filled 2024-07-02: qty 90, 90d supply, fill #0
  Filled 2024-09-18: qty 90, 90d supply, fill #1
  Filled 2024-11-17: qty 90, 90d supply, fill #2

## 2024-07-03 ENCOUNTER — Other Ambulatory Visit: Payer: Self-pay

## 2024-07-03 ENCOUNTER — Other Ambulatory Visit (HOSPITAL_BASED_OUTPATIENT_CLINIC_OR_DEPARTMENT_OTHER): Payer: Self-pay

## 2024-07-04 ENCOUNTER — Other Ambulatory Visit (HOSPITAL_BASED_OUTPATIENT_CLINIC_OR_DEPARTMENT_OTHER): Payer: Self-pay

## 2024-07-05 ENCOUNTER — Encounter: Payer: Self-pay | Admitting: Nurse Practitioner

## 2024-07-05 DIAGNOSIS — Z23 Encounter for immunization: Secondary | ICD-10-CM | POA: Diagnosis not present

## 2024-07-05 NOTE — Telephone Encounter (Signed)
 I put prescription ain front office and notified patient to come pick it up

## 2024-07-19 ENCOUNTER — Other Ambulatory Visit: Payer: Self-pay

## 2024-07-20 ENCOUNTER — Telehealth: Payer: Self-pay | Admitting: Family Medicine

## 2024-07-20 NOTE — Telephone Encounter (Signed)
 Patient called stating that in 2023 he had Orthovisc injections that worked really well. He is now having bilateral knee pain and asked if we would be able to get authorization for both knees?  Please advise.

## 2024-07-20 NOTE — Telephone Encounter (Signed)
 It looks like he was only seen for the right knee previously. I do not see XR that confirms that he has osteoarthritis of the left knee. We will need this information for insurance authorization.   Lets set up a visit with Dr. Joane for eval of both knees since it has been 2 years. We can also do XR at that time. After visit for eval, we can run benefits and get him set up for a procedure only visit to do injections, pending insurance approval.   Let me know if you have any questions.

## 2024-07-21 ENCOUNTER — Ambulatory Visit: Admitting: Family Medicine

## 2024-07-21 NOTE — Telephone Encounter (Signed)
 Appointment scheduled.

## 2024-07-24 ENCOUNTER — Ambulatory Visit (INDEPENDENT_AMBULATORY_CARE_PROVIDER_SITE_OTHER)

## 2024-07-24 ENCOUNTER — Ambulatory Visit (INDEPENDENT_AMBULATORY_CARE_PROVIDER_SITE_OTHER): Admitting: Family Medicine

## 2024-07-24 ENCOUNTER — Other Ambulatory Visit: Payer: Self-pay

## 2024-07-24 VITALS — BP 158/88 | HR 85 | Ht 68.0 in | Wt 197.0 lb

## 2024-07-24 DIAGNOSIS — M25562 Pain in left knee: Secondary | ICD-10-CM

## 2024-07-24 DIAGNOSIS — G8929 Other chronic pain: Secondary | ICD-10-CM

## 2024-07-24 DIAGNOSIS — M25561 Pain in right knee: Secondary | ICD-10-CM

## 2024-07-24 DIAGNOSIS — M170 Bilateral primary osteoarthritis of knee: Secondary | ICD-10-CM

## 2024-07-24 NOTE — Telephone Encounter (Signed)
 Patient ran for Orthovisc for bilateral knees. Case [id number]. Pending approval.

## 2024-07-24 NOTE — Patient Instructions (Addendum)
 Thank you for coming in today.   Please get an Xray today before you leave   We will work to authorize the gel shots.  My office will contact you, once we get insurance approval

## 2024-07-24 NOTE — Telephone Encounter (Signed)
 Please re-auth gel shots, BILAT knee. Got new XR 9/29

## 2024-07-24 NOTE — Progress Notes (Signed)
   LILLETTE Ileana Collet, PhD, LAT, ATC acting as a scribe for Artist Lloyd, MD.  Clinton Newton is a 73 y.o. male who presents to [facility] today for exacerbation of his bilat knee pain. Pt was previously seen by Dr. Lloyd on 04/20/22 for his R knee, and completed the Orthovisc series, 3/3.  Today, pt reports L knee pain started a couple months ago. He relates this pain to climbing into his new bed, that is a bit higher. He is wanting to try gel shots bilaterally.   Dx imaging: 08/26/20 R knee MRI 07/22/20 R knee XR  Pertinent review of systems: No fevers or chills  Relevant historical information: Bilateral knee pain   Exam:  BP (!) 158/88   Pulse 85   Ht 5' 8 (1.727 m)   Wt 197 lb (89.4 kg)   SpO2 96%   BMI 29.95 kg/m  General: Well Developed, well nourished, and in no acute distress.   MSK: Right knee normal appearing with mild effusion.  Normal motion with crepitation.  Stable ligamentous exam.  Intact strength  Left knee normal-appearing with mild effusion.  Normal motion with crepitation.  Stable ligamentous exam.  Intact strength.    Lab and Radiology Results  X-ray images bilateral knees obtained today personally and independently interpreted.  Right knee: Mild medial DJD.  No acute fractures.  Left knee: Mild medial DJD.  Mild patellofemoral DJD.  No acute fractures.  Await formal radiology review     Assessment and Plan: 73 y.o. 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  Plan for authorization of hyaluronic acid injections.  He had good benefit from these injections previously in the right knee a few years ago.  Will proceed for both knees in the near future.   PDMP not reviewed this encounter. Orders Placed This Encounter  Procedures   DG Knee AP/LAT W/Sunrise Left    Standing Status:   Future    Number of Occurrences:   1    Expiration Date:   08/23/2024    Reason for Exam (SYMPTOM  OR DIAGNOSIS REQUIRED):    bilateral knee pain    Preferred imaging location?:   [facility]   DG Knee AP/LAT W/Sunrise Right    Standing Status:   Future    Number of Occurrences:   1    Expiration Date:   08/23/2024    Reason for Exam (SYMPTOM  OR DIAGNOSIS REQUIRED):   bilateral knee pain    Preferred imaging location?:   [place]   No orders of the defined types were placed in this encounter.    Discussed warning signs or symptoms. Please see discharge instructions. Patient expresses understanding.   The above documentation has been reviewed and is accurate and complete Artist Lloyd, M.D.

## 2024-07-27 NOTE — Telephone Encounter (Signed)
 Can you schedule patient when medication is stocked   Orthovisc authorized for bilateral knee Medicare NO PRE CERT REQUIRED  Deductible $257 has met $257 Patient responsible for 20% coinsurance  Reference # IVR/Availity 07/24/24  [employer] NO PRE CERT REQUIRED  Will pick up remaining eligible expense at 100% Does not cover part B deductible Case ID [id number]

## 2024-07-28 ENCOUNTER — Ambulatory Visit: Payer: Self-pay | Admitting: Family Medicine

## 2024-07-28 NOTE — Progress Notes (Signed)
Left knee x-ray shows some mild arthritis

## 2024-07-28 NOTE — Progress Notes (Signed)
 Right knee x-ray shows mild arthritis.

## 2024-07-31 NOTE — Telephone Encounter (Signed)
 Left message for patient to call back to schedule.

## 2024-07-31 NOTE — Telephone Encounter (Signed)
 Scheduled

## 2024-08-03 ENCOUNTER — Other Ambulatory Visit: Payer: Self-pay

## 2024-08-03 ENCOUNTER — Ambulatory Visit: Admitting: Family Medicine

## 2024-08-03 ENCOUNTER — Other Ambulatory Visit (HOSPITAL_BASED_OUTPATIENT_CLINIC_OR_DEPARTMENT_OTHER): Payer: Self-pay | Admitting: Nurse Practitioner

## 2024-08-03 ENCOUNTER — Encounter: Payer: Self-pay | Admitting: Nurse Practitioner

## 2024-08-03 ENCOUNTER — Other Ambulatory Visit (HOSPITAL_BASED_OUTPATIENT_CLINIC_OR_DEPARTMENT_OTHER): Payer: Self-pay

## 2024-08-03 DIAGNOSIS — M170 Bilateral primary osteoarthritis of knee: Secondary | ICD-10-CM

## 2024-08-03 DIAGNOSIS — E782 Mixed hyperlipidemia: Secondary | ICD-10-CM

## 2024-08-03 MED ORDER — HYALURONAN 30 MG/2ML IX SOSY
30.0000 mg | PREFILLED_SYRINGE | Freq: Once | INTRA_ARTICULAR | Status: AC
  Administered 2024-08-03: 30 mg via INTRA_ARTICULAR

## 2024-08-03 MED ORDER — ROSUVASTATIN CALCIUM 20 MG PO TABS
20.0000 mg | ORAL_TABLET | Freq: Every day | ORAL | 1 refills | Status: AC
  Filled 2024-08-03: qty 90, 90d supply, fill #0
  Filled 2024-08-28 – 2024-11-17 (×2): qty 90, 90d supply, fill #1

## 2024-08-03 NOTE — Patient Instructions (Addendum)
 Thank you for coming in today.   You received an injection today. Seek immediate medical attention if the joint becomes red, extremely painful, or is oozing fluid.   Schedule the 2nd gel shot for next week, and the 3rd for the week after that.

## 2024-08-04 NOTE — Progress Notes (Signed)
 Clinton Newton presents to clinic today for Orthovisc injection bilateral knee 1/3 Procedure: Real-time Ultrasound Guided Injection of right knee joint superior lateral patellar space Device: Philips Affiniti 50G/GE Logiq Images permanently stored and available for review in PACS Verbal informed consent obtained.  Discussed risks and benefits of procedure. Warned about infection, bleeding, damage to structures among others. Patient expresses understanding and agreement Time-out conducted.   Noted no overlying erythema, induration, or other signs of local infection.   Skin prepped in a sterile fashion.   Local anesthesia: Topical Ethyl chloride.   With sterile technique and under real time ultrasound guidance: Orthovisc 30 mg injected into knee joint. Fluid seen entering the joint capsule.   Completed without difficulty   Advised to call if fevers/chills, erythema, induration, drainage, or persistent bleeding.   Images permanently stored and available for review in the ultrasound unit.  Impression: Technically successful ultrasound guided injection.  Procedure: Real-time Ultrasound Guided Injection of left knee joint superior lateral patellar space Device: Philips Affiniti 50G/GE Logiq Images permanently stored and available for review in PACS Verbal informed consent obtained.  Discussed risks and benefits of procedure. Warned about infection, bleeding, damage to structures among others. Patient expresses understanding and agreement Time-out conducted.   Noted no overlying erythema, induration, or other signs of local infection.   Skin prepped in a sterile fashion.   Local anesthesia: Topical Ethyl chloride.   With sterile technique and under real time ultrasound guidance: Orthovisc 30 mg injected into knee joint. Fluid seen entering the joint capsule.   Completed without difficulty   Advised to call if fevers/chills, erythema, induration, drainage, or persistent bleeding.   Images  permanently stored and available for review in the ultrasound unit.  Impression: Technically successful ultrasound guided injection. Lot number: [id number] Return in 1 week for Orthovisc injection both knees 2/3

## 2024-08-10 ENCOUNTER — Ambulatory Visit: Admitting: Family Medicine

## 2024-08-10 ENCOUNTER — Other Ambulatory Visit: Payer: Self-pay

## 2024-08-10 DIAGNOSIS — M170 Bilateral primary osteoarthritis of knee: Secondary | ICD-10-CM

## 2024-08-10 MED ORDER — HYALURONAN 30 MG/2ML IX SOSY
30.0000 mg | PREFILLED_SYRINGE | Freq: Once | INTRA_ARTICULAR | Status: AC
  Administered 2024-08-10: 30 mg via INTRA_ARTICULAR

## 2024-08-10 NOTE — Progress Notes (Signed)
 Clinton Newton presents to clinic today for Orthovisc injection bilateral knee 2/3 Procedure: Real-time Ultrasound Guided Injection of right knee joint superior lateral patellar space Device: Philips Affiniti 50G/GE Logiq Images permanently stored and available for review in PACS Verbal informed consent obtained.  Discussed risks and benefits of procedure. Warned about infection, bleeding, damage to structures among others. Patient expresses understanding and agreement Time-out conducted.   Noted no overlying erythema, induration, or other signs of local infection.   Skin prepped in a sterile fashion.   Local anesthesia: Topical Ethyl chloride.   With sterile technique and under real time ultrasound guidance: Orthovisc 30 mg injected into knee joint. Fluid seen entering the joint capsule.   Completed without difficulty   Advised to call if fevers/chills, erythema, induration, drainage, or persistent bleeding.   Images permanently stored and available for review in the ultrasound unit.  Impression: Technically successful ultrasound guided injection.  Procedure: Real-time Ultrasound Guided Injection of left knee joint superior lateral patellar space Device: Philips Affiniti 50G/GE Logiq Images permanently stored and available for review in PACS Verbal informed consent obtained.  Discussed risks and benefits of procedure. Warned about infection, bleeding, damage to structures among others. Patient expresses understanding and agreement Time-out conducted.   Noted no overlying erythema, induration, or other signs of local infection.   Skin prepped in a sterile fashion.   Local anesthesia: Topical Ethyl chloride.   With sterile technique and under real time ultrasound guidance: Orthovisc 30 mg injected into knee joint. Fluid seen entering the joint capsule.   Completed without difficulty   Advised to call if fevers/chills, erythema, induration, drainage, or persistent bleeding.   Images  permanently stored and available for review in the ultrasound unit.  Impression: Technically successful ultrasound guided injection. Lot number: [id number]

## 2024-08-10 NOTE — Patient Instructions (Addendum)
 Thank you for coming in today.   You received an injection today. Seek immediate medical attention if the joint becomes red, extremely painful, or is oozing fluid.   See you next week for #3

## 2024-08-17 ENCOUNTER — Ambulatory Visit: Admitting: Family Medicine

## 2024-08-17 ENCOUNTER — Other Ambulatory Visit: Payer: Self-pay

## 2024-08-17 DIAGNOSIS — M25562 Pain in left knee: Secondary | ICD-10-CM

## 2024-08-17 DIAGNOSIS — M25561 Pain in right knee: Secondary | ICD-10-CM

## 2024-08-17 DIAGNOSIS — M170 Bilateral primary osteoarthritis of knee: Secondary | ICD-10-CM

## 2024-08-17 DIAGNOSIS — G8929 Other chronic pain: Secondary | ICD-10-CM

## 2024-08-17 MED ORDER — HYALURONAN 30 MG/2ML IX SOSY
30.0000 mg | PREFILLED_SYRINGE | Freq: Once | INTRA_ARTICULAR | Status: AC
  Administered 2024-08-17: 30 mg via INTRA_ARTICULAR

## 2024-08-17 NOTE — Patient Instructions (Addendum)
 Thank you for coming in today.   You received an injection today. Seek immediate medical attention if the joint becomes red, extremely painful, or is oozing fluid.   Check back as needed  If not better, let me know

## 2024-08-17 NOTE — Progress Notes (Signed)
 Clinton Newton presents to clinic today for Orthovisc injection bilateral knee 2/3 Procedure: Real-time Ultrasound Guided Injection of right knee joint superior lateral patellar space Device: Philips Affiniti 50G/GE Logiq Images permanently stored and available for review in PACS Verbal informed consent obtained.  Discussed risks and benefits of procedure. Warned about infection, bleeding, damage to structures among others. Patient expresses understanding and agreement Time-out conducted.   Noted no overlying erythema, induration, or other signs of local infection.   Skin prepped in a sterile fashion.   Local anesthesia: Topical Ethyl chloride.   With sterile technique and under real time ultrasound guidance: Orthovisc 30 mg injected into knee joint. Fluid seen entering the joint capsule.   Completed without difficulty   Advised to call if fevers/chills, erythema, induration, drainage, or persistent bleeding.   Images permanently stored and available for review in the ultrasound unit.  Impression: Technically successful ultrasound guided injection.  Procedure: Real-time Ultrasound Guided Injection of left knee joint superior lateral patellar space Device: Philips Affiniti 50G/GE Logiq Images permanently stored and available for review in PACS Verbal informed consent obtained.  Discussed risks and benefits of procedure. Warned about infection, bleeding, damage to structures among others. Patient expresses understanding and agreement Time-out conducted.   Noted no overlying erythema, induration, or other signs of local infection.   Skin prepped in a sterile fashion.   Local anesthesia: Topical Ethyl chloride.   With sterile technique and under real time ultrasound guidance: Orthovisc 30 mg injected into knee joint. Fluid seen entering the joint capsule.   Completed without difficulty   Advised to call if fevers/chills, erythema, induration, drainage, or persistent bleeding.   Images  permanently stored and available for review in the ultrasound unit.  Impression: Technically successful ultrasound guided injection. Lot number: [id number]  Patient is not feeling much better.  Will finish out gel injection series today.  If not better after few weeks he will notify me next up should probably be trial of steroid injection.  We can schedule him back for cortisone shot if not better.

## 2024-08-28 ENCOUNTER — Encounter: Payer: Self-pay | Admitting: Radiology

## 2024-08-29 ENCOUNTER — Ambulatory Visit: Payer: Self-pay

## 2024-08-29 ENCOUNTER — Other Ambulatory Visit (HOSPITAL_BASED_OUTPATIENT_CLINIC_OR_DEPARTMENT_OTHER): Payer: Self-pay

## 2024-08-29 ENCOUNTER — Ambulatory Visit (INDEPENDENT_AMBULATORY_CARE_PROVIDER_SITE_OTHER): Admitting: Family Medicine

## 2024-08-29 ENCOUNTER — Encounter: Payer: Self-pay | Admitting: Family Medicine

## 2024-08-29 VITALS — BP 132/72 | HR 67 | Wt 200.6 lb

## 2024-08-29 DIAGNOSIS — M5440 Lumbago with sciatica, unspecified side: Secondary | ICD-10-CM

## 2024-08-29 DIAGNOSIS — M51362 Other intervertebral disc degeneration, lumbar region with discogenic back pain and lower extremity pain: Secondary | ICD-10-CM | POA: Diagnosis not present

## 2024-08-29 DIAGNOSIS — M79605 Pain in left leg: Secondary | ICD-10-CM | POA: Diagnosis not present

## 2024-08-29 MED ORDER — TRAMADOL HCL 50 MG PO TABS
50.0000 mg | ORAL_TABLET | Freq: Three times a day (TID) | ORAL | 0 refills | Status: AC | PRN
  Filled 2024-08-29: qty 15, 5d supply, fill #0

## 2024-08-29 MED ORDER — METHYLPREDNISOLONE 4 MG PO TBPK
ORAL_TABLET | ORAL | 0 refills | Status: DC
  Filled 2024-08-29: qty 21, 6d supply, fill #0

## 2024-08-29 MED ORDER — MELOXICAM 15 MG PO TABS
15.0000 mg | ORAL_TABLET | Freq: Every day | ORAL | 0 refills | Status: DC
  Filled 2024-08-29: qty 30, 30d supply, fill #0

## 2024-08-29 NOTE — Progress Notes (Signed)
 Name: Clinton Newton   Date of Visit: 08/29/24   Date of last visit with me: Visit date not found   CHIEF COMPLAINT:  Chief Complaint  Patient presents with   Acute Visit    Leg pain, feels like leg is on fire goes all the way down to toes which become numb feels like something is stabbing. Gets worse at night. Taking gabapentin  for pain.        HPI:  Discussed the use of AI scribe software for clinical note transcription with the patient, who gave verbal consent to proceed.  History of Present Illness Clinton Newton is a 73 year old male with back pain who presents with burning pain in the leg, suspected to be sciatica.  He experiences a burning sensation in his thigh and occasional tingling in his toes. He experiences pain that radiates down the side of his leg to his toes, where he also experiences numbness. He has a history of back pain and underwent an MRI of his back approximately six months ago, though he cannot recall the specific findings.  He manages his symptoms with gabapentin , which he took earlier today, and buspirone  for anxiety. Ibuprofen has not been effective in alleviating his pain. Recently, he has experienced severe pain over the last two nights, leading him to take two gabapentin  tablets and 75 mL of Benadryl, which did not provide relief. He also took a 5 mg oxycodone, which allowed him to sleep for about three hours.  He has a history of osteoarthritis in his knees and recently received a series of three gel shots about ten days ago, which he believes may have exacerbated his current symptoms due to reduced physical activity. He has not been exercising for over a month, which he thinks might have contributed to the nerve compression. He mentions having had a previous MRI in 2018, which reportedly did not show significant changes.     OBJECTIVE:       02/15/2024    2:04 PM  Depression screen PHQ 2/9  Decreased Interest 0  Down, Depressed, Hopeless 0  PHQ - 2  Score 0     BP Readings from Last 3 Encounters:  08/29/24 132/72  07/24/24 (!) 158/88  02/15/24 122/74    BP 132/72   Pulse 67   Wt 200 lb 9.6 oz (91 kg)   SpO2 97%   BMI 30.50 kg/m    Physical Exam MUSCULOSKELETAL: Pain on straight leg raise. Reflexes intact. Strength is 5/5 of the lower extremities   Physical Exam Constitutional:      Appearance: Normal appearance.  Neurological:     Mental Status: He is alert.     ASSESSMENT/PLAN:   Assessment & Plan Left leg pain  Degeneration of intervertebral disc of lumbar region with discogenic back pain and lower extremity pain  Back pain of lumbar region with sciatica    Assessment and Plan Assessment & Plan Lumbar radiculopathy and intervertebral disc degeneration with left leg pain Chronic lumbar radiculopathy with intervertebral disc degeneration causing left leg pain. Current management with gabapentin  and ibuprofen insufficient. Differential includes nerve compression due to lumbar spine issues. - Obtain MRI results from neurosurgeon for review. - Prescribed oral steroids for nerve-related pain. - Prescribed meloxicam  for anti-inflammatory effect after steroids. - Prescribed tramadol for breakthrough pain and sleep aid. - Scheduled follow-up appointment in three weeks to assess response to treatment and review MRI results.  Knee osteoarthritis Recent gel injections with post-injection swelling. Previous good response  to gel injections, but recent flare-up possibly due to decreased mobility and swelling. - Evaluate knees during follow-up visit. - Consider cortisone injection if symptoms persist. - Discuss potential for knee replacement if conservative measures fail.     Clinton Newton A. Vita MD [facility]

## 2024-08-29 NOTE — Telephone Encounter (Signed)
 FYI Only or Action Required?: FYI only for provider: appointment scheduled on 08/29/24.  Patient was last seen in primary care on 02/15/2024 by Early, Camie BRAVO, NP.  Called Nurse Triage reporting Leg Pain.  Symptoms began 10-14 days ago.  Interventions attempted: Prescription medications: gabapentin , oxycodone OTC meds: advil.  Symptoms are: entire left leg pain mild to moderate, worse at night with numbness and tingling in left foot gradually worsening.  Triage Disposition: See Physician Within 24 Hours  Patient/caregiver understands and will follow disposition?: Yes               Copied from CRM #[id number]. Topic: Clinical - Red Word Triage >> Aug 29, 2024 10:17 AM Hadassah PARAS wrote: Red Word that prompted transfer to Nurse Triage: Left stabbing pain leg, feels like leg is on fire, cannot sleep at night;  medication is no longer working Reason for Disposition  Numbness in a leg or foot (i.e., loss of sensation)  Answer Assessment - Initial Assessment Questions 1. ONSET: When did the pain start?      2 weeks to 10 days.  2. LOCATION: Where is the pain located?      Left leg. Entire leg, from hip down to toes.  3. PAIN: How bad is the pain?    (Scale 1-10; or mild, moderate, severe)     Sharp, fire type pain. Difficulty sleeping at night due to pain. 5+/10 at night. During the day pain level is 3/10.  4. WORK OR EXERCISE: Has there been any recent work or exercise that involved this part of the body?      No.  5. CAUSE: What do you think is causing the leg pain?     He states he had this pain before, saw PCP and was prescribed gabapentin . He states last night he took 2 gabapentin  and Advil which had not helped. He states he had some left over oxycodone that he took which helped.  6. OTHER SYMPTOMS: Do you have any other symptoms? (e.g., chest pain, back pain, breathing difficulty, swelling, rash, fever, numbness, weakness)     Occasional lower back pain when  bending over. He describes the pain in his left leg sometimes as numbness or tingling, primarily in the left foot.  Protocols used: Leg Pain-A-AH

## 2024-09-03 ENCOUNTER — Encounter: Payer: Self-pay | Admitting: Family Medicine

## 2024-09-03 DIAGNOSIS — M5440 Lumbago with sciatica, unspecified side: Secondary | ICD-10-CM

## 2024-09-03 DIAGNOSIS — M51362 Other intervertebral disc degeneration, lumbar region with discogenic back pain and lower extremity pain: Secondary | ICD-10-CM

## 2024-09-04 ENCOUNTER — Other Ambulatory Visit: Payer: Self-pay | Admitting: Nurse Practitioner

## 2024-09-04 ENCOUNTER — Other Ambulatory Visit (HOSPITAL_BASED_OUTPATIENT_CLINIC_OR_DEPARTMENT_OTHER): Payer: Self-pay

## 2024-09-04 DIAGNOSIS — I10 Essential (primary) hypertension: Secondary | ICD-10-CM

## 2024-09-04 DIAGNOSIS — G4700 Insomnia, unspecified: Secondary | ICD-10-CM

## 2024-09-04 MED ORDER — AMLODIPINE BESYLATE 10 MG PO TABS
10.0000 mg | ORAL_TABLET | Freq: Every day | ORAL | 1 refills | Status: AC
  Filled 2024-09-12: qty 90, 90d supply, fill #0

## 2024-09-04 NOTE — Telephone Encounter (Signed)
 Last apt 01/26/24

## 2024-09-05 ENCOUNTER — Other Ambulatory Visit: Payer: Self-pay | Admitting: Family Medicine

## 2024-09-05 ENCOUNTER — Other Ambulatory Visit (HOSPITAL_BASED_OUTPATIENT_CLINIC_OR_DEPARTMENT_OTHER): Payer: Self-pay

## 2024-09-05 DIAGNOSIS — M79605 Pain in left leg: Secondary | ICD-10-CM

## 2024-09-05 DIAGNOSIS — M51362 Other intervertebral disc degeneration, lumbar region with discogenic back pain and lower extremity pain: Secondary | ICD-10-CM

## 2024-09-05 DIAGNOSIS — M5440 Lumbago with sciatica, unspecified side: Secondary | ICD-10-CM

## 2024-09-05 MED ORDER — TRAMADOL HCL 50 MG PO TABS
50.0000 mg | ORAL_TABLET | Freq: Three times a day (TID) | ORAL | 0 refills | Status: AC | PRN
  Filled 2024-09-05: qty 30, 10d supply, fill #0

## 2024-09-05 MED ORDER — ZOLPIDEM TARTRATE 5 MG PO TABS
5.0000 mg | ORAL_TABLET | Freq: Every evening | ORAL | 3 refills | Status: DC | PRN
  Filled 2024-09-12: qty 30, 30d supply, fill #0
  Filled 2024-10-16: qty 30, 30d supply, fill #1
  Filled 2024-10-29 – 2024-10-31 (×2): qty 30, 30d supply, fill #2

## 2024-09-05 MED ORDER — PREDNISONE 20 MG PO TABS
40.0000 mg | ORAL_TABLET | Freq: Every day | ORAL | 0 refills | Status: AC
  Filled 2024-09-05: qty 20, 10d supply, fill #0

## 2024-09-12 ENCOUNTER — Other Ambulatory Visit (HOSPITAL_BASED_OUTPATIENT_CLINIC_OR_DEPARTMENT_OTHER): Payer: Self-pay

## 2024-09-12 ENCOUNTER — Other Ambulatory Visit: Payer: Self-pay

## 2024-09-14 ENCOUNTER — Other Ambulatory Visit (HOSPITAL_BASED_OUTPATIENT_CLINIC_OR_DEPARTMENT_OTHER): Payer: Self-pay

## 2024-09-14 ENCOUNTER — Encounter (HOSPITAL_BASED_OUTPATIENT_CLINIC_OR_DEPARTMENT_OTHER): Payer: Self-pay

## 2024-09-18 ENCOUNTER — Other Ambulatory Visit: Payer: Self-pay

## 2024-09-18 ENCOUNTER — Ambulatory Visit (INDEPENDENT_AMBULATORY_CARE_PROVIDER_SITE_OTHER): Admitting: Family Medicine

## 2024-09-18 ENCOUNTER — Encounter: Payer: Self-pay | Admitting: Family Medicine

## 2024-09-18 ENCOUNTER — Other Ambulatory Visit (HOSPITAL_BASED_OUTPATIENT_CLINIC_OR_DEPARTMENT_OTHER): Payer: Self-pay

## 2024-09-18 DIAGNOSIS — M5440 Lumbago with sciatica, unspecified side: Secondary | ICD-10-CM

## 2024-09-18 DIAGNOSIS — M79605 Pain in left leg: Secondary | ICD-10-CM | POA: Diagnosis not present

## 2024-09-18 DIAGNOSIS — M51362 Other intervertebral disc degeneration, lumbar region with discogenic back pain and lower extremity pain: Secondary | ICD-10-CM | POA: Diagnosis not present

## 2024-09-18 MED ORDER — MELOXICAM 15 MG PO TABS
15.0000 mg | ORAL_TABLET | Freq: Every day | ORAL | 0 refills | Status: DC
  Filled 2024-09-18 – 2024-09-20 (×3): qty 30, 30d supply, fill #0

## 2024-09-18 MED ORDER — HYDROCODONE-ACETAMINOPHEN 10-325 MG PO TABS
1.0000 | ORAL_TABLET | Freq: Three times a day (TID) | ORAL | 0 refills | Status: AC | PRN
  Filled 2024-09-18: qty 15, 5d supply, fill #0

## 2024-09-18 MED ORDER — TRAMADOL HCL 50 MG PO TABS
100.0000 mg | ORAL_TABLET | Freq: Three times a day (TID) | ORAL | 0 refills | Status: AC | PRN
  Filled 2024-09-18: qty 30, 5d supply, fill #0

## 2024-09-18 NOTE — Progress Notes (Signed)
   Name: Clinton Newton   Date of Visit: 09/18/24   Date of last visit with me: 09/05/2024   CHIEF COMPLAINT:  Chief Complaint  Patient presents with   other    F/u on lt. Leg pain, still horrible, at night is really bad and can't sleep,        HPI:  Discussed the use of AI scribe software for clinical note transcription with the patient, who gave verbal consent to proceed.  History of Present Illness   Clinton Newton is a 73 year old male who presents with persistent left-sided leg pain and numbness.  He experiences persistent left-sided leg pain and numbness radiating from his back down to his toe, described as a 'little bit of warmth'. The pain is not debilitating but has not improved since his last visit.  He uses prednisone  and tramadol  for pain management. Prednisone  has not provided significant relief, but increasing tramadol  to 100 mg has helped him sleep better, although it only provides relief for three to four hours. Norco is also used for pain management, allowing him to sleep a little better.  He is engaged in physical therapy, focusing on sciatic stretches. An MRI has been performed, but the results have not been received yet.  He continues to take meloxicam , although it does not alleviate the pain. He maintains a high fiber intake of 40 grams per day to counteract the constipating effects of opioids.         OBJECTIVE:       02/15/2024    2:04 PM  Depression screen PHQ 2/9  Decreased Interest 0  Down, Depressed, Hopeless 0  PHQ - 2 Score 0     BP Readings from Last 3 Encounters:  09/18/24 110/72  08/29/24 132/72  07/24/24 (!) 158/88    BP 110/72   Pulse 78   Wt 191 lb 3.2 oz (86.7 kg)   BMI 29.07 kg/m    Physical Exam          Physical Exam Constitutional:      Appearance: Normal appearance. He is normal weight.  Neurological:     General: No focal deficit present.     Mental Status: He is alert and oriented to person, place, and time. Mental  status is at baseline.     ASSESSMENT/PLAN:   Assessment & Plan Left leg pain  Degeneration of intervertebral disc of lumbar region with discogenic back pain and lower extremity pain  Back pain of lumbar region with sciatica    Assessment and Plan    Left-sided lumbar radiculopathy with sciatica and leg pain due to lumbar intervertebral disc degeneration Chronic radiculopathy with sciatica and leg pain due to lumbar disc degeneration. Pain not well controlled with current medications. Awaiting MRI to guide potential steroid injection. - Obtain MRI results. - Continue meloxicam  for 10 days. Refilled meloxicam  - Alternate tramadol  100 mg for pain. - Prescribed Norco for breakthrough pain. - Ensure hydration and fiber intake to prevent constipation. - Consider Miralax for bowel regularity.         Ednamae Schiano A. Vita MD [facility]

## 2024-09-18 NOTE — Telephone Encounter (Signed)
 Pt coming in today

## 2024-09-19 ENCOUNTER — Ambulatory Visit: Admitting: Family Medicine

## 2024-09-20 ENCOUNTER — Other Ambulatory Visit (HOSPITAL_BASED_OUTPATIENT_CLINIC_OR_DEPARTMENT_OTHER): Payer: Self-pay

## 2024-09-20 ENCOUNTER — Other Ambulatory Visit: Payer: Self-pay

## 2024-09-25 ENCOUNTER — Telehealth: Payer: Self-pay

## 2024-09-25 NOTE — Telephone Encounter (Signed)
 Copied from CRM [id number]. Topic: Clinical - Lab/Test Results >> Sep 25, 2024  9:59 AM Ashley R wrote: Reason for CRM: Pt. Is waiting to hear back about steroid injection based on MRI results handed in on 11/24. Wants to know if they were reviewed and if its possible to schedule early this week.

## 2024-09-26 ENCOUNTER — Other Ambulatory Visit: Payer: Self-pay

## 2024-09-26 DIAGNOSIS — M5440 Lumbago with sciatica, unspecified side: Secondary | ICD-10-CM

## 2024-09-26 DIAGNOSIS — M51362 Other intervertebral disc degeneration, lumbar region with discogenic back pain and lower extremity pain: Secondary | ICD-10-CM

## 2024-09-28 ENCOUNTER — Inpatient Hospital Stay
Admission: RE | Admit: 2024-09-28 | Discharge: 2024-09-28 | Disposition: A | Source: Ambulatory Visit | Attending: Family Medicine

## 2024-09-28 DIAGNOSIS — M51362 Other intervertebral disc degeneration, lumbar region with discogenic back pain and lower extremity pain: Secondary | ICD-10-CM

## 2024-09-28 DIAGNOSIS — M5440 Lumbago with sciatica, unspecified side: Secondary | ICD-10-CM

## 2024-09-28 MED ORDER — IOPAMIDOL (ISOVUE-M 200) INJECTION 41%
1.0000 mL | Freq: Once | INTRAMUSCULAR | Status: AC
  Administered 2024-09-28: 1 mL via EPIDURAL

## 2024-09-28 MED ORDER — METHYLPREDNISOLONE ACETATE 40 MG/ML INJ SUSP (RADIOLOG
80.0000 mg | Freq: Once | INTRAMUSCULAR | Status: AC
  Administered 2024-09-28: 80 mg via EPIDURAL

## 2024-09-28 NOTE — Discharge Instructions (Signed)

## 2024-09-29 ENCOUNTER — Ambulatory Visit: Admitting: Family Medicine

## 2024-10-02 ENCOUNTER — Emergency Department (HOSPITAL_COMMUNITY)

## 2024-10-02 ENCOUNTER — Other Ambulatory Visit: Payer: Self-pay

## 2024-10-02 ENCOUNTER — Observation Stay (HOSPITAL_COMMUNITY)
Admission: EM | Admit: 2024-10-02 | Discharge: 2024-10-05 | DRG: 871 | Disposition: A | Attending: Family Medicine | Admitting: Family Medicine

## 2024-10-02 DIAGNOSIS — R531 Weakness: Secondary | ICD-10-CM | POA: Diagnosis not present

## 2024-10-02 DIAGNOSIS — G9340 Encephalopathy, unspecified: Secondary | ICD-10-CM | POA: Diagnosis not present

## 2024-10-02 DIAGNOSIS — N390 Urinary tract infection, site not specified: Secondary | ICD-10-CM | POA: Diagnosis not present

## 2024-10-02 DIAGNOSIS — E871 Hypo-osmolality and hyponatremia: Secondary | ICD-10-CM

## 2024-10-02 DIAGNOSIS — R4182 Altered mental status, unspecified: Secondary | ICD-10-CM | POA: Diagnosis not present

## 2024-10-02 DIAGNOSIS — R918 Other nonspecific abnormal finding of lung field: Secondary | ICD-10-CM | POA: Diagnosis not present

## 2024-10-02 DIAGNOSIS — R509 Fever, unspecified: Secondary | ICD-10-CM | POA: Diagnosis not present

## 2024-10-02 DIAGNOSIS — R000 Tachycardia, unspecified: Secondary | ICD-10-CM | POA: Diagnosis not present

## 2024-10-02 DIAGNOSIS — N3000 Acute cystitis without hematuria: Secondary | ICD-10-CM | POA: Diagnosis not present

## 2024-10-02 LAB — URINALYSIS, ROUTINE W REFLEX MICROSCOPIC
Bilirubin Urine: NEGATIVE
Glucose, UA: NEGATIVE mg/dL
Ketones, ur: 20 mg/dL — AB
Leukocytes,Ua: NEGATIVE
Nitrite: NEGATIVE
Protein, ur: 100 mg/dL — AB
Specific Gravity, Urine: 1.02 (ref 1.005–1.030)
pH: 5 (ref 5.0–8.0)

## 2024-10-02 LAB — COMPREHENSIVE METABOLIC PANEL WITH GFR
ALT: 106 U/L — ABNORMAL HIGH (ref 0–44)
AST: 110 U/L — ABNORMAL HIGH (ref 15–41)
Albumin: 3 g/dL — ABNORMAL LOW (ref 3.5–5.0)
Alkaline Phosphatase: 121 U/L (ref 38–126)
Anion gap: 14 (ref 5–15)
BUN: 16 mg/dL (ref 8–23)
CO2: 24 mmol/L (ref 22–32)
Calcium: 8.5 mg/dL — ABNORMAL LOW (ref 8.9–10.3)
Chloride: 89 mmol/L — ABNORMAL LOW (ref 98–111)
Creatinine, Ser: 0.89 mg/dL (ref 0.61–1.24)
GFR, Estimated: >60 mL/min (ref >60)
Glucose, Bld: 129 mg/dL — ABNORMAL HIGH (ref 70–99)
Potassium: 3.7 mmol/L (ref 3.5–5.1)
Sodium: 127 mmol/L — ABNORMAL LOW (ref 135–145)
Total Bilirubin: 1 mg/dL (ref 0.0–1.2)
Total Protein: 7.4 g/dL (ref 6.5–8.1)

## 2024-10-02 LAB — CBC WITH DIFFERENTIAL/PLATELET
Abs Immature Granulocytes: 0.23 K/uL — ABNORMAL HIGH (ref 0.00–0.07)
Basophils Absolute: 0.1 K/uL (ref 0.0–0.1)
Basophils Relative: 1 %
Eosinophils Absolute: 0 K/uL (ref 0.0–0.5)
Eosinophils Relative: 0 %
HCT: 42.7 % (ref 39.0–52.0)
Hemoglobin: 14.6 g/dL (ref 13.0–17.0)
Immature Granulocytes: 2 %
Lymphocytes Relative: 6 %
Lymphs Abs: 0.8 K/uL (ref 0.7–4.0)
MCH: 31.7 pg (ref 26.0–34.0)
MCHC: 34.2 g/dL (ref 30.0–36.0)
MCV: 92.6 fL (ref 80.0–100.0)
Monocytes Absolute: 2 K/uL — ABNORMAL HIGH (ref 0.1–1.0)
Monocytes Relative: 13 %
Neutro Abs: 11.4 K/uL — ABNORMAL HIGH (ref 1.7–7.7)
Neutrophils Relative %: 78 %
Platelets: 390 K/uL (ref 150–400)
RBC: 4.61 MIL/uL (ref 4.22–5.81)
RDW: 12.9 % (ref 11.5–15.5)
WBC: 14.5 K/uL — ABNORMAL HIGH (ref 4.0–10.5)
nRBC: 0 % (ref 0.0–0.2)

## 2024-10-02 LAB — HEPATITIS PANEL, ACUTE
HCV Ab: NONREACTIVE
Hep A IgM: NONREACTIVE
Hep B C IgM: NONREACTIVE
Hepatitis B Surface Ag: NONREACTIVE

## 2024-10-02 LAB — ACETAMINOPHEN LEVEL: Acetaminophen (Tylenol), Serum: <10 ug/mL — ABNORMAL LOW (ref 10–30)

## 2024-10-02 LAB — ETHANOL: Alcohol, Ethyl (B): <15 mg/dL (ref <15)

## 2024-10-02 LAB — AMMONIA: Ammonia: <13 umol/L (ref 9–35)

## 2024-10-02 LAB — I-STAT CG4 LACTIC ACID, ED: Lactic Acid, Venous: 1.8 mmol/L (ref 0.5–1.9)

## 2024-10-02 LAB — CBG MONITORING, ED: Glucose-Capillary: 121 mg/dL — ABNORMAL HIGH (ref 70–99)

## 2024-10-02 MED ORDER — TRAZODONE HCL 50 MG PO TABS: 25.0000 mg | ORAL_TABLET | Freq: Every evening | ORAL | Status: DC | PRN

## 2024-10-02 MED ORDER — ZOLPIDEM TARTRATE 5 MG PO TABS
5.0000 mg | ORAL_TABLET | Freq: Every evening | ORAL | Status: DC | PRN
  Administered 2024-10-03 – 2024-10-04 (×2): 5 mg via ORAL
  Filled 2024-10-02 (×2): qty 1

## 2024-10-02 MED ORDER — ONDANSETRON HCL 4 MG/2ML IJ SOLN: 4.0000 mg | Freq: Four times a day (QID) | INTRAMUSCULAR | Status: DC | PRN

## 2024-10-02 MED ORDER — PANTOPRAZOLE SODIUM 40 MG PO TBEC
40.0000 mg | DELAYED_RELEASE_TABLET | Freq: Every day | ORAL | Status: DC
  Administered 2024-10-02 – 2024-10-05 (×4): 40 mg via ORAL
  Filled 2024-10-02 (×4): qty 1

## 2024-10-02 MED ORDER — ALBUTEROL SULFATE (2.5 MG/3ML) 0.083% IN NEBU: 2.5000 mg | INHALATION_SOLUTION | RESPIRATORY_TRACT | Status: DC | PRN

## 2024-10-02 MED ORDER — SODIUM CHLORIDE 0.9 % IV SOLN: 1.0000 g | INTRAVENOUS | Status: DC

## 2024-10-02 MED ORDER — ACETAMINOPHEN 325 MG PO TABS: 650.0000 mg | ORAL_TABLET | Freq: Four times a day (QID) | ORAL | Status: DC | PRN

## 2024-10-02 MED ORDER — BUSPIRONE HCL 5 MG PO TABS
7.5000 mg | ORAL_TABLET | Freq: Three times a day (TID) | ORAL | Status: DC
  Administered 2024-10-02 – 2024-10-05 (×8): 7.5 mg via ORAL
  Filled 2024-10-02 (×8): qty 2

## 2024-10-02 MED ORDER — ONDANSETRON HCL 4 MG PO TABS: 4.0000 mg | ORAL_TABLET | Freq: Four times a day (QID) | ORAL | Status: DC | PRN

## 2024-10-02 MED ORDER — SODIUM CHLORIDE 0.9 % IV BOLUS
1000.0000 mL | Freq: Once | INTRAVENOUS | Status: AC
  Administered 2024-10-02: 1000 mL via INTRAVENOUS

## 2024-10-02 MED ORDER — SODIUM CHLORIDE 0.9 % IV SOLN: INTRAVENOUS | Status: AC

## 2024-10-02 MED ORDER — SODIUM CHLORIDE 0.9 % IV SOLN
2.0000 g | Freq: Once | INTRAVENOUS | Status: AC
  Administered 2024-10-02: 2 g via INTRAVENOUS
  Filled 2024-10-02: qty 20

## 2024-10-02 MED ORDER — ENOXAPARIN SODIUM 40 MG/0.4ML IJ SOSY
40.0000 mg | PREFILLED_SYRINGE | Freq: Every day | INTRAMUSCULAR | Status: DC
  Administered 2024-10-02 – 2024-10-04 (×3): 40 mg via SUBCUTANEOUS
  Filled 2024-10-02 (×3): qty 0.4

## 2024-10-02 MED ORDER — SODIUM CHLORIDE 0.9 % IV BOLUS
500.0000 mL | Freq: Once | INTRAVENOUS | Status: AC
  Administered 2024-10-02: 500 mL via INTRAVENOUS

## 2024-10-02 MED ORDER — BISOPROLOL FUMARATE 5 MG PO TABS
2.5000 mg | ORAL_TABLET | Freq: Every day | ORAL | Status: DC
  Administered 2024-10-02 – 2024-10-05 (×4): 2.5 mg via ORAL
  Filled 2024-10-02 (×4): qty 1

## 2024-10-02 MED ORDER — AMLODIPINE BESYLATE 10 MG PO TABS
10.0000 mg | ORAL_TABLET | Freq: Every day | ORAL | Status: DC
  Administered 2024-10-02 – 2024-10-05 (×4): 10 mg via ORAL
  Filled 2024-10-02 (×4): qty 1

## 2024-10-02 MED ORDER — SODIUM CHLORIDE 0.9 % IV SOLN
1.0000 g | INTRAVENOUS | Status: AC
  Administered 2024-10-03 – 2024-10-04 (×2): 1 g via INTRAVENOUS
  Filled 2024-10-02 (×2): qty 10

## 2024-10-02 MED ORDER — ACETAMINOPHEN 650 MG RE SUPP: 650.0000 mg | Freq: Four times a day (QID) | RECTAL | Status: DC | PRN

## 2024-10-02 NOTE — Plan of Care (Signed)
  Problem: Health Behavior/Discharge Planning: Goal: Ability to manage health-related needs will improve Outcome: Progressing   Problem: Clinical Measurements: Goal: Ability to maintain clinical measurements within normal limits will improve Outcome: Progressing   Problem: Nutrition: Goal: Adequate nutrition will be maintained Outcome: Progressing   

## 2024-10-02 NOTE — ED Notes (Signed)
 Pt's wife reported becoming more cognitively impaired with slower speech and general weakness on Saturday.

## 2024-10-02 NOTE — H&P (Addendum)
 History and Physical  DEMARIO FANIEL FMW:[id number] DOB: February 11, 1951 DOA: 10/02/2024  PCP: Oris Camie BRAVO, NP   Chief Complaint: Confusion, falls  HPI: TOMOKI LUCKEN is a 73 y.o. male with medical history significant for left-sided sciatica now status post epidural steroid injection, GERD, hypertension, hyperlipidemia being admitted to the hospital with 2 days of generalized weakness, mild confusion found to have a UTI.  History is provided by the patient, as well as my conversation with ER provider.  Patient has been suffering from some time with sciatica, had a recent epidural steroid injection and his pain is actually much better.  However, since that time his wife has noted some progressive generalized weakness, mild confusion.  No fevers or chills.  Patient states that he has had some burning and increased frequency of urination.  Denies any other complaints.  I was also able to speak with the patient's wife.  Review of Systems: Please see HPI for pertinent positives and negatives. A complete 10 system review of systems are otherwise negative.  Past Medical History:  Diagnosis Date   Arthritis    L3-L4   Cataract    Colon polyps    Diverticulitis    GERD (gastroesophageal reflux disease)    Hyperlipidemia    Hypertension    Past Surgical History:  Procedure Laterality Date   APPENDECTOMY     COLONOSCOPY     POLYPECTOMY     PRE-MALIGNANT / BENIGN SKIN LESION EXCISION     Social History:  reports that he has never smoked. He has never used smokeless tobacco. He reports current alcohol use of about 7.0 standard drinks of alcohol per week. He reports that he does not use drugs.  No Known Allergies  Family History  Problem Relation Age of Onset   Heart disease Father    Colon cancer Neg Hx    Pancreatic cancer Neg Hx    Rectal cancer Neg Hx    Stomach cancer Neg Hx    Esophageal cancer Neg Hx    Colon polyps Neg Hx      Prior to Admission medications   Medication Sig  Start Date End Date Taking? Authorizing Provider  alfuzosin  (UROXATRAL ) 10 MG 24 hr tablet Take 1 tablet (10 mg total) by mouth daily. 07/02/24   Stoneking, Adine PARAS., MD  amLODipine  (NORVASC ) 10 MG tablet Take 1 tablet (10 mg total) by mouth daily. 09/04/24   Early, Sara E, NP  ascorbic acid (VITAMIN C) 1000 MG tablet Take 1,000 mg by mouth daily.    [provider]  bisoprolol  (ZEBETA ) 5 MG tablet Take 1/2 tablet (2.5 mg total) by mouth daily. 06/05/24   Early, Sara E, NP  busPIRone  (BUSPAR ) 7.5 MG tablet Take 1 tablet (7.5 mg total) by mouth 3 (three) times daily. 03/22/24   Early, Sara E, NP  cholecalciferol (VITAMIN D) 1000 UNITS tablet Take 1,000 Units by mouth daily.    [provider]  Flaxseed, Linseed, (FLAXSEED OIL PO) Take by mouth as directed.    [provider]  gabapentin  (NEURONTIN ) 300 MG capsule Take 1 capsule at bedtime. May take up to 2 additional capsules during the day as needed for leg pain. Patient not taking: Reported on 09/18/2024 02/17/24   Early, Camie BRAVO, NP  hydrocortisone  (ANUSOL -HC) 25 MG suppository Place 1 suppository (25 mg total) rectally 2 (two) times daily as needed for hemorrhoids or anal itching. 12/14/23   Early, Sara E, NP  meloxicam  (MOBIC ) 15 MG tablet Take  1 tablet (15 mg total) by mouth daily. 09/18/24   Jha, Panav, MD  methocarbamol  (ROBAXIN ) 750 MG tablet Take 1 tablet (750 mg total) by mouth 3 (three) times daily. Patient taking differently: Take 750 mg by mouth 3 (three) times daily. prn 02/15/24   Early, Sara E, NP  Multiple Vitamin (MULTIVITAMIN) tablet Take 1 tablet by mouth daily.    [provider]  Omega-3 Fatty Acids (FISH OIL PO) Take by mouth daily.    [provider]  omeprazole (PRILOSEC) 10 MG capsule  10/26/08   [provider]  prednisoLONE  acetate (PRED FORTE ) 1 % ophthalmic suspension Place 1 drop into both eyes 2 (two) times daily. Patient not taking: Reported on 09/18/2024 01/13/24      Probiotic Product (PROBIOTIC PO) Take by mouth daily.    [provider]  rosuvastatin  (CRESTOR ) 20 MG tablet Take 1 tablet (20 mg total) by mouth daily. 08/03/24   Early, Sara E, NP  vitamin B-12 (CYANOCOBALAMIN) 1000 MCG tablet Take 1,000 mcg by mouth daily.    [provider]  zolpidem  (AMBIEN ) 5 MG tablet Take 1 tablet (5 mg total) by mouth at bedtime as needed. for sleep 09/05/24   Early, Camie BRAVO, NP    Physical Exam: BP (!) 150/76   Pulse (!) 114   Temp 97.8 F (36.6 C) (Oral)   Resp (!) 25   SpO2 97%  General:  Alert, oriented, calm, in no acute distress  Cardiovascular: RRR, no murmurs or rubs, no peripheral edema  Respiratory: clear to auscultation bilaterally, no wheezes, no crackles  Abdomen: soft, nontender, nondistended, normal bowel tones heard  Skin: dry, no rashes  Musculoskeletal: no joint effusions, normal range of motion, steroid injection site on his back looks benign Psychiatric: appropriate affect, normal speech  Neurologic: extraocular muscles intact, clear speech, moving all extremities with intact sensorium         Labs on Admission:  Basic Metabolic Panel: Recent Labs  Lab 10/02/24 1020  NA 127*  K 3.7  CL 89*  CO2 24  GLUCOSE 129*  BUN 16  CREATININE 0.89  CALCIUM  8.5*   Liver Function Tests: Recent Labs  Lab 10/02/24 1020  AST 110*  ALT 106*  ALKPHOS 121  BILITOT 1.0  PROT 7.4  ALBUMIN 3.0*   No results for input(s): LIPASE, AMYLASE in the last 168 hours. No results for input(s): AMMONIA in the last 168 hours. CBC: Recent Labs  Lab 10/02/24 1020  WBC 14.5*  NEUTROABS 11.4*  HGB 14.6  HCT 42.7  MCV 92.6  PLT 390   Cardiac Enzymes: No results for input(s): CKTOTAL, CKMB, CKMBINDEX, TROPONINI in the last 168 hours. BNP (last 3 results) No results for input(s): BNP in the last 8760 hours.  ProBNP (last 3 results) No results for input(s): PROBNP in the last 8760 hours.  CBG: Recent Labs   Lab 10/02/24 0937  GLUCAP 121*    Radiological Exams on Admission: DG Chest Portable 1 View Result Date: 10/02/2024 EXAM: 1 VIEW(S) XRAY OF THE CHEST 10/02/2024 12:13:00 PM COMPARISON: PA and lateral radiographs of the chest dated 04/13/2017. CLINICAL HISTORY: weakness FINDINGS: LUNGS AND PLEURA: Left base linear atelectasis versus scarring. No pleural effusion. No pneumothorax. HEART AND MEDIASTINUM: No acute abnormality of the cardiac and mediastinal silhouettes. BONES AND SOFT TISSUES: No acute osseous abnormality. IMPRESSION: 1. Left base linear atelectasis versus scarring. Electronically signed by: Evalene Coho MD 10/02/2024 12:53 PM EST RP Workstation: [id number]   CT Head Wo  Contrast Result Date: 10/02/2024 EXAM: CT HEAD WITHOUT CONTRAST 10/02/2024 10:26:26 AM TECHNIQUE: CT of the head was performed without the administration of intravenous contrast. Automated exposure control, iterative reconstruction, and/or weight based adjustment of the mA/kV was utilized to reduce the radiation dose to as low as reasonably achievable. COMPARISON: None available. CLINICAL HISTORY: Mental status change, unknown cause FINDINGS: BRAIN AND VENTRICLES: No acute hemorrhage. No evidence of acute infarct. No hydrocephalus. No extra-axial collection. No mass effect or midline shift. ORBITS: Bilateral lens replacement. SINUSES: No acute abnormality. SOFT TISSUES AND SKULL: No acute soft tissue abnormality. No skull fracture. IMPRESSION: 1. No acute intracranial abnormality. Electronically signed by: Evalene Coho MD 10/02/2024 10:53 AM EST RP Workstation: [id number]   Assessment/Plan Dempsey LITTIE Che is a 73 y.o. male with medical history significant for left-sided sciatica now status post epidural steroid injection, GERD, hypertension, hyperlipidemia being admitted to the hospital with 2 days of generalized weakness, mild confusion found to have a UTI.   Sepsis due to UTI-with abnormal urinalysis, mild  leukocytosis, tachycardia, altered mental status.  Lactic acid is normal. -Observation admission -Empiric IV Rocephin  -Follow-up blood and urine cultures  Hypertension-continue home meds once reconciled  Hyponatremia-patient looks mildly volume depleted on exam, possibly a hypovolemic hyponatremia -Will hydrate gently with normal saline overnight  Abnormal LFTs-unclear etiology, patient denies any abdominal pain or vomiting. -Hold Lipitor and other hepatotoxins -Check Tylenol  level, and acute hepatitis panel -Trend LFTs, may need outpatient workup if persistent  Weakness and mechanical falls at home-potentially due to his UTI -Fall precautions -PT/OT evaluation  DVT prophylaxis: Lovenox      Code Status: Full Code, confirmed with patient and his wife at the time of admission.  Consults called: None  Admission status: Observation  Time spent: 48 minutes  Regan Mcbryar CHRISTELLA Gail MD [facility] Pager [phone]  If 7PM-7AM, please contact night-coverage [email or web address] Password [email or web address]  10/02/2024, 1:29 PM

## 2024-10-02 NOTE — ED Provider Notes (Signed)
 [place] EMERGENCY DEPARTMENT AT [facility] Provider Note   CSN: [id number] Arrival date & time: 10/02/24  9152     Patient presents with: Clinton Newton is a 73 y.o. male.    Fall  Patient with confusion and some falls.  Had epidural injection for low back pain on Thursday with stating Monday.  Back pain is improved but has had some more confusion since.  Has had a couple falls.  Including 1 in the acute head.  Somewhat difficult to get history from patient.  No fevers.  May have had some urinary frequency.  States he has not been taking the pain medicines that he is chronically on because the pain is improved.     Prior to Admission medications   Medication Sig Start Date End Date Taking? Authorizing Provider  alfuzosin  (UROXATRAL ) 10 MG 24 hr tablet Take 1 tablet (10 mg total) by mouth daily. 07/02/24   Stoneking, Adine PARAS., MD  amLODipine  (NORVASC ) 10 MG tablet Take 1 tablet (10 mg total) by mouth daily. 09/04/24   Early, Sara E, NP  ascorbic acid (VITAMIN C) 1000 MG tablet Take 1,000 mg by mouth daily.    [provider]  bisoprolol  (ZEBETA ) 5 MG tablet Take 1/2 tablet (2.5 mg total) by mouth daily. 06/05/24   Early, Sara E, NP  busPIRone  (BUSPAR ) 7.5 MG tablet Take 1 tablet (7.5 mg total) by mouth 3 (three) times daily. 03/22/24   Early, Sara E, NP  cholecalciferol (VITAMIN D) 1000 UNITS tablet Take 1,000 Units by mouth daily.    [provider]  Flaxseed, Linseed, (FLAXSEED OIL PO) Take by mouth as directed.    [provider]  gabapentin  (NEURONTIN ) 300 MG capsule Take 1 capsule at bedtime. May take up to 2 additional capsules during the day as needed for leg pain. Patient not taking: Reported on 09/18/2024 02/17/24   Early, Sara E, NP  hydrocortisone  (ANUSOL -HC) 25 MG suppository Place 1 suppository (25 mg total) rectally 2 (two) times daily as needed for hemorrhoids or anal itching. 12/14/23   Early, Sara E, NP  meloxicam  (MOBIC )  15 MG tablet Take 1 tablet (15 mg total) by mouth daily. 09/18/24   Jha, Panav, MD  methocarbamol  (ROBAXIN ) 750 MG tablet Take 1 tablet (750 mg total) by mouth 3 (three) times daily. Patient taking differently: Take 750 mg by mouth 3 (three) times daily. prn 02/15/24   Early, Sara E, NP  Multiple Vitamin (MULTIVITAMIN) tablet Take 1 tablet by mouth daily.    [provider]  Omega-3 Fatty Acids (FISH OIL PO) Take by mouth daily.    [provider]  omeprazole (PRILOSEC) 10 MG capsule  10/26/08   [provider]  prednisoLONE  acetate (PRED FORTE ) 1 % ophthalmic suspension Place 1 drop into both eyes 2 (two) times daily. Patient not taking: Reported on 09/18/2024 01/13/24     Probiotic Product (PROBIOTIC PO) Take by mouth daily.    [provider]  rosuvastatin  (CRESTOR ) 20 MG tablet Take 1 tablet (20 mg total) by mouth daily. 08/03/24   Early, Sara E, NP  vitamin B-12 (CYANOCOBALAMIN) 1000 MCG tablet Take 1,000 mcg by mouth daily.    [provider]  zolpidem  (AMBIEN ) 5 MG tablet Take 1 tablet (5 mg total) by mouth at bedtime as needed. for sleep 09/05/24   Early, Sara E, NP    Allergies: Patient has no known allergies.    Review of Systems  Updated  Vital Signs BP (!) 150/76   Pulse (!) 114   Temp 97.8 F (36.6 C) (Oral)   Resp (!) 25   SpO2 97%   Physical Exam Vitals and nursing note reviewed.  HENT:     Head: Atraumatic.  Eyes:     Extraocular Movements: Extraocular movements intact.  Chest:     Chest wall: No tenderness.  Abdominal:     Tenderness: There is no abdominal tenderness.  Musculoskeletal:     Cervical back: Neck supple. No tenderness.  Skin:    General: Skin is warm.  Neurological:     Mental Status: He is alert.     Comments: Awake and answers questions.  However somewhat slow to answer.  May have some mild confusion.  Not baseline per person with patient.     (all labs ordered are listed, but only abnormal results  are displayed) Labs Reviewed  COMPREHENSIVE METABOLIC PANEL WITH GFR - Abnormal; Notable for the following components:      Result Value   Sodium 127 (*)    Chloride 89 (*)    Glucose, Bld 129 (*)    Calcium  8.5 (*)    Albumin 3.0 (*)    AST 110 (*)    ALT 106 (*)    All other components within normal limits  CBC WITH DIFFERENTIAL/PLATELET - Abnormal; Notable for the following components:   WBC 14.5 (*)    Neutro Abs 11.4 (*)    Monocytes Absolute 2.0 (*)    Abs Immature Granulocytes 0.23 (*)    All other components within normal limits  URINALYSIS, ROUTINE W REFLEX MICROSCOPIC - Abnormal; Notable for the following components:   Color, Urine AMBER (*)    APPearance HAZY (*)    Hgb urine dipstick SMALL (*)    Ketones, ur 20 (*)    Protein, ur 100 (*)    Bacteria, UA RARE (*)    All other components within normal limits  CBG MONITORING, ED - Abnormal; Notable for the following components:   Glucose-Capillary 121 (*)    All other components within normal limits  URINE CULTURE  CULTURE, BLOOD (ROUTINE X 2)  CULTURE, BLOOD (ROUTINE X 2)  ETHANOL  ACETAMINOPHEN  LEVEL  AMMONIA  I-STAT CG4 LACTIC ACID, ED    EKG: EKG Interpretation Date/Time:  Monday October 02 2024 09:33:49 EST Ventricular Rate:  126 PR Interval:  129 QRS Duration:  88 QT Interval:  299 QTC Calculation: 433 R Axis:   52  Text Interpretation: Sinus tachycardia LAE, consider biatrial enlargement Borderline T abnormalities, inferior leads Confirmed by Patsey Lot [id number]) on 10/02/2024 9:40:09 AM  Radiology: CT Head Wo Contrast Result Date: 10/02/2024 EXAM: CT HEAD WITHOUT CONTRAST 10/02/2024 10:26:26 AM TECHNIQUE: CT of the head was performed without the administration of intravenous contrast. Automated exposure control, iterative reconstruction, and/or weight based adjustment of the mA/kV was utilized to reduce the radiation dose to as low as reasonably achievable. COMPARISON: None available. CLINICAL  HISTORY: Mental status change, unknown cause FINDINGS: BRAIN AND VENTRICLES: No acute hemorrhage. No evidence of acute infarct. No hydrocephalus. No extra-axial collection. No mass effect or midline shift. ORBITS: Bilateral lens replacement. SINUSES: No acute abnormality. SOFT TISSUES AND SKULL: No acute soft tissue abnormality. No skull fracture. IMPRESSION: 1. No acute intracranial abnormality. Electronically signed by: Evalene Coho MD 10/02/2024 10:53 AM EST RP Workstation: [id number]     Procedures   Medications Ordered in the ED  cefTRIAXone  (ROCEPHIN ) 2 g in sodium chloride  0.9 %  100 mL IVPB (has no administration in time range)  sodium chloride  0.9 % bolus 1,000 mL (has no administration in time range)  sodium chloride  0.9 % bolus 500 mL (500 mLs Intravenous New Bag/Given 10/02/24 1136)                                    Medical Decision Making Amount and/or Complexity of Data Reviewed Labs: ordered. Radiology: ordered.  Risk Decision regarding hospitalization.   Patient with tachycardia.  Mental status change.  Recent epidural steroid injection.  No fevers.  Did have a fall and head.  Differential diagnosis does include causes such as hyperglycemia.  Also infection.  Also fell and hit head for possibility of intracranial injury.  Will get blood work head CT urinalysis and basic blood work.  Reviewed notes from recent sports medicine visit.  Head CT reassuring.  Blood work does show a hyponatremia at 127.  Also LFTs mildly elevated.  Urinalysis did result and shows likely UTI.  I think with UTI and encephalopathy patient benefit from mission the hospital.  Has not been febrile.  White count is mildly elevated.  Now with mental status change and potential infection will add lactate and blood cultures.  Will give Rocephin  to cover urinary source.  Will discuss with hospitalist for admission.   CRITICAL CARE Performed by: [place] Total critical care time: 30  minutes Critical care time was exclusive of separately billable procedures and treating other patients. Critical care was necessary to treat or prevent imminent or life-threatening deterioration. Critical care was time spent personally by me on the following activities: development of treatment plan with patient and/or surrogate as well as nursing, discussions with consultants, evaluation of patient's response to treatment, examination of patient, obtaining history from patient or surrogate, ordering and performing treatments and interventions, ordering and review of laboratory studies, ordering and review of radiographic studies, pulse oximetry and re-evaluation of patient's condition.      Final diagnoses:  Lower urinary tract infectious disease  Encephalopathy, unspecified type  Hyponatremia    ED Discharge Orders     None          River Rankin, MD 10/02/24 1247

## 2024-10-02 NOTE — ED Triage Notes (Addendum)
 Pt arrives via EMS from home. PT had an epidural on Thursday to treat sciatica. Following the procedure the pt has had 2 falls that he and spouse describe as slips which seem to be mechanical in nature. PT denies pain, denies injury.   EMS  BP 120/76 P-118  Glucose 128mg /dL

## 2024-10-03 ENCOUNTER — Ambulatory Visit: Payer: Self-pay

## 2024-10-03 DIAGNOSIS — Z791 Long term (current) use of non-steroidal anti-inflammatories (NSAID): Secondary | ICD-10-CM | POA: Diagnosis not present

## 2024-10-03 DIAGNOSIS — E785 Hyperlipidemia, unspecified: Secondary | ICD-10-CM | POA: Diagnosis present

## 2024-10-03 DIAGNOSIS — Z860100 Personal history of colon polyps, unspecified: Secondary | ICD-10-CM | POA: Diagnosis not present

## 2024-10-03 DIAGNOSIS — N400 Enlarged prostate without lower urinary tract symptoms: Secondary | ICD-10-CM | POA: Diagnosis present

## 2024-10-03 DIAGNOSIS — N390 Urinary tract infection, site not specified: Secondary | ICD-10-CM | POA: Diagnosis present

## 2024-10-03 DIAGNOSIS — R7989 Other specified abnormal findings of blood chemistry: Secondary | ICD-10-CM | POA: Diagnosis present

## 2024-10-03 DIAGNOSIS — M5442 Lumbago with sciatica, left side: Secondary | ICD-10-CM | POA: Diagnosis present

## 2024-10-03 DIAGNOSIS — R350 Frequency of micturition: Secondary | ICD-10-CM | POA: Diagnosis present

## 2024-10-03 DIAGNOSIS — I10 Essential (primary) hypertension: Secondary | ICD-10-CM | POA: Diagnosis present

## 2024-10-03 DIAGNOSIS — Y92009 Unspecified place in unspecified non-institutional (private) residence as the place of occurrence of the external cause: Secondary | ICD-10-CM | POA: Diagnosis not present

## 2024-10-03 DIAGNOSIS — R296 Repeated falls: Secondary | ICD-10-CM | POA: Diagnosis present

## 2024-10-03 DIAGNOSIS — G9341 Metabolic encephalopathy: Secondary | ICD-10-CM | POA: Diagnosis present

## 2024-10-03 DIAGNOSIS — Z79899 Other long term (current) drug therapy: Secondary | ICD-10-CM | POA: Diagnosis not present

## 2024-10-03 DIAGNOSIS — E871 Hypo-osmolality and hyponatremia: Secondary | ICD-10-CM | POA: Diagnosis present

## 2024-10-03 DIAGNOSIS — A419 Sepsis, unspecified organism: Secondary | ICD-10-CM | POA: Diagnosis present

## 2024-10-03 DIAGNOSIS — N3000 Acute cystitis without hematuria: Secondary | ICD-10-CM | POA: Diagnosis not present

## 2024-10-03 DIAGNOSIS — Z8249 Family history of ischemic heart disease and other diseases of the circulatory system: Secondary | ICD-10-CM | POA: Diagnosis not present

## 2024-10-03 DIAGNOSIS — E869 Volume depletion, unspecified: Secondary | ICD-10-CM | POA: Diagnosis present

## 2024-10-03 DIAGNOSIS — W19XXXA Unspecified fall, initial encounter: Secondary | ICD-10-CM | POA: Diagnosis present

## 2024-10-03 LAB — COMPREHENSIVE METABOLIC PANEL WITH GFR
ALT: 87 U/L — ABNORMAL HIGH (ref 0–44)
AST: 92 U/L — ABNORMAL HIGH (ref 15–41)
Albumin: 2.5 g/dL — ABNORMAL LOW (ref 3.5–5.0)
Alkaline Phosphatase: 124 U/L (ref 38–126)
Anion gap: 12 (ref 5–15)
BUN: 11 mg/dL (ref 8–23)
CO2: 22 mmol/L (ref 22–32)
Calcium: 8 mg/dL — ABNORMAL LOW (ref 8.9–10.3)
Chloride: 96 mmol/L — ABNORMAL LOW (ref 98–111)
Creatinine, Ser: 0.53 mg/dL — ABNORMAL LOW (ref 0.61–1.24)
GFR, Estimated: >60 mL/min (ref >60)
Glucose, Bld: 129 mg/dL — ABNORMAL HIGH (ref 70–99)
Potassium: 3.6 mmol/L (ref 3.5–5.1)
Sodium: 129 mmol/L — ABNORMAL LOW (ref 135–145)
Total Bilirubin: 0.8 mg/dL (ref 0.0–1.2)
Total Protein: 6.5 g/dL (ref 6.5–8.1)

## 2024-10-03 LAB — CBC
HCT: 38.6 % — ABNORMAL LOW (ref 39.0–52.0)
Hemoglobin: 13.3 g/dL (ref 13.0–17.0)
MCH: 31.9 pg (ref 26.0–34.0)
MCHC: 34.5 g/dL (ref 30.0–36.0)
MCV: 92.6 fL (ref 80.0–100.0)
Platelets: 335 K/uL (ref 150–400)
RBC: 4.17 MIL/uL — ABNORMAL LOW (ref 4.22–5.81)
RDW: 13.2 % (ref 11.5–15.5)
WBC: 12.2 K/uL — ABNORMAL HIGH (ref 4.0–10.5)
nRBC: 0 % (ref 0.0–0.2)

## 2024-10-03 LAB — URINE CULTURE: Culture: NO GROWTH

## 2024-10-03 MED ORDER — SODIUM CHLORIDE 0.9 % IV SOLN: INTRAVENOUS | Status: AC

## 2024-10-03 NOTE — Telephone Encounter (Signed)
 Caller disconnected before warm transfer, states she will call back. This clinical research associate reviewed chart. Patient is currently admitted to [facility] for UTI Sepsis.    Copied from CRM [id number]. Topic: General - Other >> Oct 03, 2024 10:31 AM Antwanette L wrote: Reason for CRM: Beth, the patient's wife, called to report that the patient was acting unusually. The patient was admitted to [facility] on 12/8 for a UTI. Beth stated she is unsure of the next steps to take. While speaking with Beth, the hospital nurse entered the room, and Beth said she would call back.

## 2024-10-03 NOTE — Plan of Care (Signed)

## 2024-10-03 NOTE — Evaluation (Signed)
 Occupational Therapy Evaluation Patient Details Name: Clinton Newton MRN: [id number] DOB: 09/24/1951 Today's Date: 10/03/2024   History of Present Illness   73 yo M adm s/p fall with concussion found to have sepsis 2/2 UTI (+) with confussion. PMH Lside sciatica GERD HTN HLD arthritis, cataract     Clinical Impressions Pt reported at PLOF they require to go up 2 STE to enter their single level home. He reported he was going to fitness center 3 x a week working on his balance and did not use DME to ambulate. At this time needs increase in time to process information, problem solving through tasks and self reported feeling confused.  He was able to ambulate with RW with CGA to supervision, complete UE ADLS with set up-S and LE ADLS with S-CGA. At this time recommendation for continued inpatient follow up therapy, <3 hours/day but as confusion may clear would potentially progress towards HH. As he reported his wife can not physically assist him with the return to home.     If plan is discharge home, recommend the following:   Assistance with cooking/housework;Direct supervision/assist for medications management;Direct supervision/assist for financial management;Assist for transportation;Help with stairs or ramp for entrance;Supervision due to cognitive status;A little help with walking and/or transfers;A little help with bathing/dressing/bathroom     Functional Status Assessment   Patient has had a recent decline in their functional status and demonstrates the ability to make significant improvements in function in a reasonable and predictable amount of time.     Equipment Recommendations    (RW)     Recommendations for Other Services         Precautions/Restrictions   Precautions Precautions: Fall Recall of Precautions/Restrictions: Intact Restrictions Weight Bearing Restrictions Per Provider Order: No     Mobility Bed Mobility Overal bed mobility: Modified  Independent             General bed mobility comments: used rail on R side but bed was in supine    Transfers Overall transfer level: Needs assistance Equipment used: Rolling walker (2 wheels) Transfers: Sit to/from Stand Sit to Stand: Supervision, Contact guard assist           General transfer comment: needs time to sequence      Balance Overall balance assessment: Needs assistance Sitting-balance support: Feet supported Sitting balance-Leahy Scale: Good     Standing balance support: Bilateral upper extremity supported, No upper extremity supported Standing balance-Leahy Scale: Fair Standing balance comment: pt reports feeling more steady when using a RW to ambulate                           ADL either performed or assessed with clinical judgement   ADL Overall ADL's : Needs assistance/impaired Eating/Feeding: Independent;Sitting   Grooming: Wash/dry hands;Wash/dry face;Supervision/safety;Contact guard assist   Upper Body Bathing: Set up;Sitting   Lower Body Bathing: Contact guard assist;Sit to/from stand;Sitting/lateral leans   Upper Body Dressing : Set up;Sitting   Lower Body Dressing: Contact guard assist;Sit to/from stand   Toilet Transfer: Supervision/safety;Contact guard assist;Rolling walker (2 wheels)   Toileting- Clothing Manipulation and Hygiene: Contact guard assist;Sit to/from stand       Functional mobility during ADLs: Contact guard assist;Rolling walker (2 wheels)       Vision Baseline Vision/History: 1 Wears glasses Ability to See in Adequate Light: 0 Adequate Patient Visual Report: No change from baseline Vision Assessment?: Wears glasses for reading     Perception  Perception: Within Functional Limits       Praxis Praxis: WFL       Pertinent Vitals/Pain Pain Assessment Pain Assessment: No/denies pain     Extremity/Trunk Assessment Upper Extremity Assessment Upper Extremity Assessment: Overall WFL for tasks  assessed   Lower Extremity Assessment Lower Extremity Assessment: Defer to PT evaluation       Communication Communication Communication: No apparent difficulties   Cognition Arousal: Alert Behavior During Therapy: WFL for tasks assessed/performed Cognition: Cognition impaired   Orientation impairments: Situation Awareness: Intellectual awareness impaired, Online awareness impaired   Attention impairment (select first level of impairment): Selective attention Executive functioning impairment (select all impairments): Sequencing, Reasoning, Problem solving, Organization OT - Cognition Comments: Pt noted icrease in time to process on how to complete tasks. Pt was able to report they are not their self.                 Following commands: Impaired Following commands impaired: Follows multi-step commands with increased time     Cueing  General Comments   Cueing Techniques: Verbal cues      Exercises     Shoulder Instructions      Home Living Family/patient expects to be discharged to:: Private residence Living Arrangements: Spouse/significant other Available Help at Discharge: Family Type of Home: House Home Access: Stairs to enter Secretary/administrator of Steps: 2 Entrance Stairs-Rails:  (was not sure) Home Layout: One level     Bathroom Shower/Tub: Producer, Television/film/video: Standard     Home Equipment: Shower seat          Prior Functioning/Environment Prior Level of Function : Independent/Modified Independent (Reported goping to fitness classes 3x a week)             Mobility Comments: indep ADLs Comments: indep    OT Problem List: Decreased activity tolerance;Impaired balance (sitting and/or standing);Decreased safety awareness   OT Treatment/Interventions: Self-care/ADL training;Therapeutic exercise;DME and/or AE instruction;Therapeutic activities;Patient/family education;Balance training      OT Goals(Current goals can be  found in the care plan section)   Acute Rehab OT Goals Patient Stated Goal: to get better OT Goal Formulation: With patient Time For Goal Achievement: 10/17/24 Potential to Achieve Goals: Good   OT Frequency:  Min 2X/week    Co-evaluation              AM-PAC OT 6 Clicks Daily Activity     Outcome Measure Help from another person eating meals?: None Help from another person taking care of personal grooming?: A Little Help from another person toileting, which includes using toliet, bedpan, or urinal?: A Little Help from another person bathing (including washing, rinsing, drying)?: A Little Help from another person to put on and taking off regular upper body clothing?: None Help from another person to put on and taking off regular lower body clothing?: A Little 6 Click Score: 20   End of Session Equipment Utilized During Treatment: Gait belt;Rolling walker (2 wheels) Nurse Communication: Mobility status  Activity Tolerance: Patient tolerated treatment well Patient left: in chair;with call bell/phone within reach;with chair alarm set  OT Visit Diagnosis: Unsteadiness on feet (R26.81);Other abnormalities of gait and mobility (R26.89);Muscle weakness (generalized) (M62.81)                Time: 9164-9087 OT Time Calculation (min): 37 min Charges:  OT General Charges $OT Visit: 1 Visit OT Evaluation $OT Eval Low Complexity: 1 Low OT Treatments $Self Care/Home Management : 8-22 mins  Warrick  K OTR/L  Acute Rehab Services  [phone] office number   Warrick Berber 10/03/2024, 9:21 AM

## 2024-10-03 NOTE — Progress Notes (Signed)
 PT Cancellation Note  Patient Details Name: Clinton Newton MRN: [id number] DOB: 06-Feb-1951   Cancelled Treatment:    Reason Eval/Treat Not Completed: Fatigue/lethargy limiting ability to participate. PT returned 1822 and pt in bed resting. Pt reported he was not really feeling up to PT today, pt politely declined and requested intervention tomorrow. PT to continue to follow acutely.  [place], PT Acute Rehab   Clinton Newton 10/03/2024, 6:29 PM

## 2024-10-03 NOTE — Progress Notes (Signed)
 PROGRESS NOTE    Clinton Newton  FMW:[id number] DOB: Mar 19, 1951 DOA: 10/02/2024  PCP: Oris Camie BRAVO, NP    Brief Narrative:  This 73 y.o. male with medical history significant for left-sided sciatica now status post epidural steroid injection, GERD, hypertension, hyperlipidemia being admitted to the hospital with 2 days of generalized weakness, mild confusion found to have a UTI.  Patient has been suffering from some time with sciatica, had a recent epidural steroid injection and his pain is actually much better. However, since that time his wife has noted some progressive generalized weakness, mild confusion. No fevers or chills. Patient states that he has had some burning and increased frequency of urination.  Patient was admitted for further evaluation.  Assessment & Plan:   Principal Problem:   UTI (urinary tract infection)  Sepsis due to UTI: Patient presented with abnormal urinalysis, mild leukocytosis, tachycardia, altered mental status.   Lactic acid is normal. Continue empiric IV antibiotics. Blood and urine culture negative. Patient continued to have burning urination.  Will continue ceftriaxone  for 3 days. Sepsis physiology improving   Hypertension: Continue amlodipine  and bisoprolol .   Hyponatremia: Patient looks mildly volume depleted on exam, possibly a hypovolemic hyponatremia. Continue IV hydration.  Serum sodium improving. 127 >129   Abnormal LFTs: unclear etiology, patient denies any abdominal pain or vomiting. Hold Lipitor and other hepatotoxins Check Tylenol  level, and acute hepatitis panel Trend LFTs, may need outpatient workup if persistent.   Weakness and mechanical falls at home: potentially due to his UTI -Fall precautions -PT/OT evaluation.  DVT prophylaxis: Lovenox  Code Status: Full code Family Communication: Wife at bedside Disposition Plan:    Status is: Inpatient Remains inpatient appropriate because: Admitted for sepsis secondary to  UTI.   Consultants:  None  Procedures:CT head  Antimicrobials: Anti-infectives (From admission, onward)    Start     Dose/Rate Route Frequency Ordered Stop   10/03/24 1300  cefTRIAXone  (ROCEPHIN ) 1 g in sodium chloride  0.9 % 100 mL IVPB        1 g 200 mL/hr over 30 Minutes Intravenous Every 24 hours 10/02/24 1331 10/04/24 2359   10/02/24 1330  cefTRIAXone  (ROCEPHIN ) 1 g in sodium chloride  0.9 % 100 mL IVPB  Status:  Discontinued        1 g 200 mL/hr over 30 Minutes Intravenous Every 24 hours 10/02/24 1329 10/02/24 1331   10/02/24 1300  cefTRIAXone  (ROCEPHIN ) 2 g in sodium chloride  0.9 % 100 mL IVPB        2 g 200 mL/hr over 30 Minutes Intravenous  Once 10/02/24 1245 10/02/24 1432      Subjective: Patient was seen and examined at bedside.  Overnight events noted. Patient was sitting in the chair,  states he is feeling better.   Still reports having burning in the urination.   Objective: Vitals:   10/02/24 2329 10/03/24 0350 10/03/24 0814 10/03/24 0927  BP: 134/68 136/66 (!) 142/73 121/68  Pulse: 91 87 93   Resp: 18 18 16    Temp: 98.7 F (37.1 C) 98 F (36.7 C) 98.1 F (36.7 C)   TempSrc: Oral Oral    SpO2: 93% 92% 93%   Height:        Intake/Output Summary (Last 24 hours) at 10/03/2024 1446 Last data filed at 10/03/2024 0700 Gross per 24 hour  Intake 484.89 ml  Output 675 ml  Net -190.11 ml   There were no vitals filed for this visit.  Examination:  General exam: Appears calm and  comfortable, deconditioned, not in any acute distress. Respiratory system: CTA Bilaterally. Respiratory effort normal.  RR 14 Cardiovascular system: S1 & S2 heard, RRR. No JVD, murmurs, rubs, gallops or clicks. No pedal edema. Gastrointestinal system: Abdomen is non distended, soft and non tender.  Normal bowel sounds heard. Central nervous system: Alert and oriented X 3. No focal neurological deficits. Extremities: No edema, no cyanosis, no clubbing. Skin: No rashes, lesions or  ulcers Psychiatry: Judgement and insight appear normal. Mood & affect appropriate.    Data Reviewed: I have personally reviewed following labs and imaging studies  CBC: Recent Labs  Lab 10/02/24 1020 10/03/24 0436  WBC 14.5* 12.2*  NEUTROABS 11.4*  --   HGB 14.6 13.3  HCT 42.7 38.6*  MCV 92.6 92.6  PLT 390 335   Basic Metabolic Panel: Recent Labs  Lab 10/02/24 1020 10/03/24 0436  NA 127* 129*  K 3.7 3.6  CL 89* 96*  CO2 24 22  GLUCOSE 129* 129*  BUN 16 11  CREATININE 0.89 0.53*  CALCIUM  8.5* 8.0*   GFR: CrCl cannot be calculated (Unknown ideal weight.). Liver Function Tests: Recent Labs  Lab 10/02/24 1020 10/03/24 0436  AST 110* 92*  ALT 106* 87*  ALKPHOS 121 124  BILITOT 1.0 0.8  PROT 7.4 6.5  ALBUMIN 3.0* 2.5*   No results for input(s): LIPASE, AMYLASE in the last 168 hours. Recent Labs  Lab 10/02/24 1300  AMMONIA <13   Coagulation Profile: No results for input(s): INR, PROTIME in the last 168 hours. Cardiac Enzymes: No results for input(s): CKTOTAL, CKMB, CKMBINDEX, TROPONINI in the last 168 hours. BNP (last 3 results) No results for input(s): PROBNP in the last 8760 hours. HbA1C: No results for input(s): HGBA1C in the last 72 hours. CBG: Recent Labs  Lab 10/02/24 0937  GLUCAP 121*   Lipid Profile: No results for input(s): CHOL, HDL, LDLCALC, TRIG, CHOLHDL, LDLDIRECT in the last 72 hours. Thyroid  Function Tests: No results for input(s): TSH, T4TOTAL, FREET4, T3FREE, THYROIDAB in the last 72 hours. Anemia Panel: No results for input(s): VITAMINB12, FOLATE, FERRITIN, TIBC, IRON, RETICCTPCT in the last 72 hours. Sepsis Labs: Recent Labs  Lab 10/02/24 1315  LATICACIDVEN 1.8    Recent Results (from the past 240 hours)  Urine Culture     Status: None   Collection Time: 10/02/24 12:44 PM   Specimen: Urine, Clean Catch  Result Value Ref Range Status   Specimen Description   Final     URINE, CLEAN CATCH Performed at [facility], [address] [place], [place] [address]    Special Requests   Final    NONE Performed at [facility], [address], [place], [place] [address]    Culture   Final    NO GROWTH Performed at [facility], [address] [place], [place] [address]    Report Status 10/03/2024 FINAL  Final  Culture, blood (routine x 2)     Status: None (Preliminary result)   Collection Time: 10/02/24  1:00 PM   Specimen: BLOOD RIGHT ARM  Result Value Ref Range Status   Specimen Description   Final    BLOOD RIGHT ARM Performed at [facility], [address] [place], [place] [address]    Special Requests   Final    BOTTLES DRAWN AEROBIC AND ANAEROBIC Blood Culture results may not be optimal due to an inadequate volume of blood received in culture bottles Performed at [facility],  [address] [place], [place] [address]    Culture   Final    NO GROWTH < 24 HOURS Performed at [facility], [address], [place], [place] [address]    Report Status PENDING  Incomplete  Culture, blood (routine x 2)     Status: None (Preliminary result)   Collection Time: 10/02/24  1:01 PM   Specimen: BLOOD LEFT ARM  Result Value Ref Range Status   Specimen Description   Final    BLOOD LEFT ARM Performed at [facility], [address], [place], [place] [address]    Special Requests   Final    BOTTLES DRAWN AEROBIC AND ANAEROBIC Blood Culture adequate volume Performed at [facility], [address], [place], [place] [address]    Culture   Final    NO GROWTH < 24 HOURS Performed at [facility], [address] [place], [place] [address]    Report Status PENDING  Incomplete    Radiology Studies: DG Chest Portable 1 View Result Date: 10/02/2024 EXAM: 1 VIEW(S) XRAY OF THE CHEST 10/02/2024 12:13:00 PM COMPARISON: PA and lateral radiographs of the chest  dated 04/13/2017. CLINICAL HISTORY: weakness FINDINGS: LUNGS AND PLEURA: Left base linear atelectasis versus scarring. No pleural effusion. No pneumothorax. HEART AND MEDIASTINUM: No acute abnormality of the cardiac and mediastinal silhouettes. BONES AND SOFT TISSUES: No acute osseous abnormality. IMPRESSION: 1. Left base linear atelectasis versus scarring. Electronically signed by: Evalene Coho MD 10/02/2024 12:53 PM EST RP Workstation: [id number]   CT Head Wo Contrast Result Date: 10/02/2024 EXAM: CT HEAD WITHOUT CONTRAST 10/02/2024 10:26:26 AM TECHNIQUE: CT of the head was performed without the administration of intravenous contrast. Automated exposure control, iterative reconstruction, and/or weight based adjustment of the mA/kV was utilized to reduce the radiation dose to as low as reasonably achievable. COMPARISON: None available. CLINICAL HISTORY: Mental status change, unknown cause FINDINGS: BRAIN AND VENTRICLES: No acute hemorrhage. No evidence of acute infarct. No hydrocephalus. No extra-axial collection. No mass effect or midline shift. ORBITS: Bilateral lens replacement. SINUSES: No acute abnormality. SOFT TISSUES AND SKULL: No acute soft tissue abnormality. No skull fracture. IMPRESSION: 1. No acute intracranial abnormality. Electronically signed by: Evalene Coho MD 10/02/2024 10:53 AM EST RP Workstation: [id number]   Scheduled Meds:  amLODipine   10 mg Oral Daily   bisoprolol   2.5 mg Oral Daily   busPIRone   7.5 mg Oral TID   enoxaparin  (LOVENOX ) injection  40 mg Subcutaneous QHS   pantoprazole   40 mg Oral Daily   Continuous Infusions:  cefTRIAXone  (ROCEPHIN )  IV 1 g (10/03/24 1326)     LOS: 0 days    Time spent: 50 Mins    Darcel Dawley, MD [facility]   If 7PM-7AM, please contact night-coverage

## 2024-10-03 NOTE — Progress Notes (Signed)
 PT Note  Patient Details Name: Clinton Newton MRN: [id number] DOB: 1951-05-22   Cancelled Treatment:    Reason Eval/Treat Not Completed: Other (comment). Pt seated in recliner when PT arrived at 1707 spouse present and pt eating a meal. Pt agreeable to PT returning later in the evening as schedule allows. PT to continue to follow acutely.   [place], PT Acute Rehab   [place] VEAR Drone 10/03/2024, 5:14 PM

## 2024-10-04 DIAGNOSIS — N3000 Acute cystitis without hematuria: Secondary | ICD-10-CM | POA: Diagnosis not present

## 2024-10-04 NOTE — Evaluation (Signed)
 Physical Therapy Evaluation Patient Details Name: Clinton Newton MRN: [id number] DOB: [birth year] Today's Date: 10/04/2024  History of Present Illness  73 yo M adm s/p fall with concussion found to have sepsis 2/2 UTI (+) with confussion. PMH Lside sciatica GERD HTN HLD arthritis, cataract  Clinical Impression    Pt admitted with above diagnosis.  Pt currently with functional limitations due to the deficits listed below (see PT Problem List). Pt in bed when PT arrived. Spouse and sister present. Pt agreeable to therapy intervention. Pt is mod I for bed mobility, S and cues for transfer tasks,  CGA and progressing to S for gait tasks 500 feet no AD and no overt LOB, pt able to participate with dynamic gait tasks in hallway and accept moderate external perturbation with progressively narrowing BOS and no LOB. Pt states LOB occurs often when urinary urgency and incontinent episodes, PT recommends use of adult absorbent undergarments and or use of urinal to reduce fall risk. Pt and family verbalized understanding. Pt left seated EOB with family present and all needs in place.  Pt will benefit from acute skilled PT to increase their independence and safety with mobility to allow discharge.         If plan is discharge home, recommend the following: A little help with walking and/or transfers;A little help with bathing/dressing/bathroom;Assistance with cooking/housework;Assist for transportation;Help with stairs or ramp for entrance   Can travel by private vehicle        Equipment Recommendations None recommended by PT  Recommendations for Other Services       Functional Status Assessment Patient has had a recent decline in their functional status and demonstrates the ability to make significant improvements in function in a reasonable and predictable amount of time.     Precautions / Restrictions Precautions Precautions: Fall Recall of Precautions/Restrictions: Intact Restrictions Weight  Bearing Restrictions Per Provider Order: No      Mobility  Bed Mobility Overal bed mobility: Modified Independent             General bed mobility comments: use of hospital bed    Transfers Overall transfer level: Needs assistance Equipment used: None Transfers: Sit to/from Stand Sit to Stand: Supervision           General transfer comment: min cues for proper UE placement    Ambulation/Gait Ambulation/Gait assistance: Supervision, Contact guard assist Gait Distance (Feet): 500 Feet Assistive device: None Gait Pattern/deviations: Step-through pattern Gait velocity: WFL     General Gait Details: pt demonstrated good reciprocal pattern, LEs clearing floor appropriatly, initally mild instability with turns with narrow BOS but no overt LOB, pt able to participate with dynamic gait tasks with CGA and min cues including stop/start change in gait velocity and direction as well as head turns and nods and no LOB  Stairs            Wheelchair Mobility     Tilt Bed    Modified Rankin (Stroke Patients Only)       Balance Overall balance assessment: Needs assistance Sitting-balance support: Feet supported Sitting balance-Leahy Scale: Good     Standing balance support: Bilateral upper extremity supported, No upper extremity supported Standing balance-Leahy Scale: Good Standing balance comment: no overt LOB with external perturbation with narrowing BOS nor with dynamic gait tasks no UE support                             Pertinent  Vitals/Pain Pain Assessment Pain Assessment: No/denies pain    Home Living Family/patient expects to be discharged to:: Private residence Living Arrangements: Spouse/significant other Available Help at Discharge: Family Type of Home: House Home Access: Stairs to enter   Secretary/administrator of Steps: 2   Home Layout: One level Home Equipment: Shower seat      Prior Function Prior Level of Function :  Independent/Modified Independent (Reported goping to fitness classes 3x a week)             Mobility Comments: indep ADLs Comments: indep     Extremity/Trunk Assessment        Lower Extremity Assessment Lower Extremity Assessment: Overall WFL for tasks assessed    Cervical / Trunk Assessment Cervical / Trunk Assessment: Normal  Communication   Communication Communication: No apparent difficulties    Cognition Arousal: Alert Behavior During Therapy: WFL for tasks assessed/performed   PT - Cognitive impairments: No apparent impairments                         Following commands: Intact       Cueing Cueing Techniques: Verbal cues     General Comments      Exercises     Assessment/Plan    PT Assessment Patient needs continued PT services  PT Problem List Decreased balance;Decreased activity tolerance;Decreased mobility       PT Treatment Interventions Gait training;Stair training;Functional mobility training;Therapeutic activities;Therapeutic exercise;Balance training;Neuromuscular re-education;Patient/family education    PT Goals (Current goals can be found in the Care Plan section)  Acute Rehab PT Goals Patient Stated Goal: to be able to go home on Thursday PT Goal Formulation: With patient Time For Goal Achievement: 10/18/24 Potential to Achieve Goals: Good    Frequency Min 3X/week     Co-evaluation               AM-PAC PT 6 Clicks Mobility  Outcome Measure Help needed turning from your back to your side while in a flat bed without using bedrails?: None Help needed moving from lying on your back to sitting on the side of a flat bed without using bedrails?: None Help needed moving to and from a bed to a chair (including a wheelchair)?: A Little Help needed standing up from a chair using your arms (e.g., wheelchair or bedside chair)?: A Little Help needed to walk in hospital room?: A Little Help needed climbing 3-5 steps with a  railing? : A Lot 6 Click Score: 19    End of Session Equipment Utilized During Treatment: Gait belt Activity Tolerance: Patient tolerated treatment well;No increased pain Patient left: in bed;with call bell/phone within reach;with family/visitor present Nurse Communication: Mobility status PT Visit Diagnosis: Other abnormalities of gait and mobility (R26.89);Repeated falls (R29.6)    Time: 8942-8882 PT Time Calculation (min) (ACUTE ONLY): 20 min   Charges:   PT Evaluation $PT Eval Low Complexity: 1 Low   PT General Charges $$ ACUTE PT VISIT: 1 Visit         [place], PT Acute Rehab   [place] VEAR Drone 10/04/2024, 11:28 AM

## 2024-10-04 NOTE — Progress Notes (Signed)
 Mobility Specialist Progress Note:   10/04/24 1512  Mobility  Activity Ambulated with assistance  Level of Assistance Independent  Assistive Device  (IV Pole)  Distance Ambulated (ft) 515 ft  Activity Response Tolerated well  Mobility Referral Yes  Mobility visit 1 Mobility  Mobility Specialist Start Time (ACUTE ONLY) 1451  Mobility Specialist Stop Time (ACUTE ONLY) 1505  Mobility Specialist Time Calculation (min) (ACUTE ONLY) 14 min   Pt was received in bed and agreed to mobility. No complaints or issues during ambulation. Returned to bed with all needs met. Call bell in reach.  [employer] - Mobility Specialist

## 2024-10-04 NOTE — Progress Notes (Signed)
 PROGRESS NOTE    Clinton Newton  FMW:[id number] DOB: 1950/12/30 DOA: 10/02/2024  PCP: Oris Camie BRAVO, NP    Brief Narrative:  This 73 y.o. male with medical history significant for left-sided sciatica now status post epidural steroid injection, GERD, hypertension, hyperlipidemia being admitted to the hospital with 2 days of generalized weakness, mild confusion found to have a UTI.  Patient has been suffering from some time with sciatica, had a recent epidural steroid injection and his pain is actually much better. However, since that time his wife has noted some progressive generalized weakness, mild confusion. No fevers or chills. Patient states that he has had some burning and increased frequency of urination.  Patient was admitted for further evaluation.  Assessment & Plan:   Principal Problem:   UTI (urinary tract infection)   Acute encephalopathy likely multifactorial: Sepsis due to UTI: Patient presented with abnormal urinalysis, mild leukocytosis, tachycardia, altered mental status.   Lactic acid is normal.  Serum sodium is 127.  CT head no acute intracranial abnormality found. Continue empiric IV antibiotics. Blood and urine culture negative. Patient continued to have burning urination.  Will continue ceftriaxone  for 3 days. Sepsis physiology improving.  Mentation back to normal.   Hypertension: Continue amlodipine  and bisoprolol .   Hyponatremia: Patient looks mildly volume depleted on exam, possibly a hypovolemic hyponatremia. Continue IV hydration.  Serum sodium improving. 127 >129   Abnormal LFTs: unclear etiology, patient denies any abdominal pain or vomiting. Hold Lipitor and other hepatotoxins Hepatitis panel negative, Tylenol  level within normal range. Trend LFTs, may need outpatient workup if persistent.   Weakness and mechanical falls at home: potentially due to his UTI, Low sodium -Fall precautions -PT/OT evaluation.  DVT prophylaxis: Lovenox  Code  Status: Full code Family Communication: Wife at bedside Disposition Plan:    Status is: Inpatient Remains inpatient appropriate because: Admitted for encephalopathy multifactorial in setting of UTI and hyponatremia.   Consultants:  None  Procedures:CT head  Antimicrobials: Anti-infectives (From admission, onward)    Start     Dose/Rate Route Frequency Ordered Stop   10/03/24 1300  cefTRIAXone  (ROCEPHIN ) 1 g in sodium chloride  0.9 % 100 mL IVPB        1 g 200 mL/hr over 30 Minutes Intravenous Every 24 hours 10/02/24 1331 10/04/24 2359   10/02/24 1330  cefTRIAXone  (ROCEPHIN ) 1 g in sodium chloride  0.9 % 100 mL IVPB  Status:  Discontinued        1 g 200 mL/hr over 30 Minutes Intravenous Every 24 hours 10/02/24 1329 10/02/24 1331   10/02/24 1300  cefTRIAXone  (ROCEPHIN ) 2 g in sodium chloride  0.9 % 100 mL IVPB        2 g 200 mL/hr over 30 Minutes Intravenous  Once 10/02/24 1245 10/02/24 1432      Subjective: Patient was seen and examined at bedside.  Overnight events noted. Patient reports he is feeling better, burning urination is improving. He denies any other symptoms.   Objective: Vitals:   10/03/24 0927 10/03/24 1943 10/04/24 0531 10/04/24 0959  BP: 121/68 131/71 (!) 150/77 (!) 150/77  Pulse:  84 100   Resp:  16 16   Temp:  98.2 F (36.8 C) 97.7 F (36.5 C)   TempSrc:  Oral Oral   SpO2:  93% 96%   Height:        Intake/Output Summary (Last 24 hours) at 10/04/2024 1158 Last data filed at 10/03/2024 1600 Gross per 24 hour  Intake 103.6 ml  Output 100 ml  Net 3.6 ml   There were no vitals filed for this visit.  Examination:  General exam: Appears calm and comfortable, deconditioned, not in any acute distress. Respiratory system: CTA Bilaterally. Respiratory effort normal.  RR 15 Cardiovascular system: S1 & S2 heard, RRR. No JVD, murmurs, rubs, gallops or clicks. No pedal edema. Gastrointestinal system: Abdomen is non distended, soft and non tender.  Normal  bowel sounds heard. Central nervous system: Alert and oriented X 3. No focal neurological deficits. Extremities: No edema, no cyanosis, no clubbing. Skin: No rashes, lesions or ulcers Psychiatry: Judgement and insight appear normal. Mood & affect appropriate.    Data Reviewed: I have personally reviewed following labs and imaging studies  CBC: Recent Labs  Lab 10/02/24 1020 10/03/24 0436  WBC 14.5* 12.2*  NEUTROABS 11.4*  --   HGB 14.6 13.3  HCT 42.7 38.6*  MCV 92.6 92.6  PLT 390 335   Basic Metabolic Panel: Recent Labs  Lab 10/02/24 1020 10/03/24 0436  NA 127* 129*  K 3.7 3.6  CL 89* 96*  CO2 24 22  GLUCOSE 129* 129*  BUN 16 11  CREATININE 0.89 0.53*  CALCIUM  8.5* 8.0*   GFR: CrCl cannot be calculated (Unknown ideal weight.). Liver Function Tests: Recent Labs  Lab 10/02/24 1020 10/03/24 0436  AST 110* 92*  ALT 106* 87*  ALKPHOS 121 124  BILITOT 1.0 0.8  PROT 7.4 6.5  ALBUMIN 3.0* 2.5*   No results for input(s): LIPASE, AMYLASE in the last 168 hours. Recent Labs  Lab 10/02/24 1300  AMMONIA <13   Coagulation Profile: No results for input(s): INR, PROTIME in the last 168 hours. Cardiac Enzymes: No results for input(s): CKTOTAL, CKMB, CKMBINDEX, TROPONINI in the last 168 hours. BNP (last 3 results) No results for input(s): PROBNP in the last 8760 hours. HbA1C: No results for input(s): HGBA1C in the last 72 hours. CBG: Recent Labs  Lab 10/02/24 0937  GLUCAP 121*   Lipid Profile: No results for input(s): CHOL, HDL, LDLCALC, TRIG, CHOLHDL, LDLDIRECT in the last 72 hours. Thyroid  Function Tests: No results for input(s): TSH, T4TOTAL, FREET4, T3FREE, THYROIDAB in the last 72 hours. Anemia Panel: No results for input(s): VITAMINB12, FOLATE, FERRITIN, TIBC, IRON, RETICCTPCT in the last 72 hours. Sepsis Labs: Recent Labs  Lab 10/02/24 1315  LATICACIDVEN 1.8    Recent Results (from the past 240  hours)  Urine Culture     Status: None   Collection Time: 10/02/24 12:44 PM   Specimen: Urine, Clean Catch  Result Value Ref Range Status   Specimen Description   Final    URINE, CLEAN CATCH Performed at [facility], [address] [place], [place] [address]    Special Requests   Final    NONE Performed at [facility] [address] [place], [place] [address]    Culture   Final    NO GROWTH Performed at [facility], [address], [place], [place] [address]    Report Status 10/03/2024 FINAL  Final  Culture, blood (routine x 2)     Status: None (Preliminary result)   Collection Time: 10/02/24  1:00 PM   Specimen: BLOOD RIGHT ARM  Result Value Ref Range Status   Specimen Description   Final    BLOOD RIGHT ARM Performed at [facility], [address], [place], [place] [address]    Special Requests   Final    BOTTLES DRAWN AEROBIC AND ANAEROBIC Blood Culture results  may not be optimal due to an inadequate volume of blood received in culture bottles Performed at [facility], [address], [place], [place] [address]    Culture   Final    NO GROWTH 2 DAYS Performed at [facility], [address], [place], [place] [address]    Report Status PENDING  Incomplete  Culture, blood (routine x 2)     Status: None (Preliminary result)   Collection Time: 10/02/24  1:01 PM   Specimen: BLOOD LEFT ARM  Result Value Ref Range Status   Specimen Description   Final    BLOOD LEFT ARM Performed at [facility], [address] [place], [place] [address]    Special Requests   Final    BOTTLES DRAWN AEROBIC AND ANAEROBIC Blood Culture adequate volume Performed at [facility], [address] [place], [place] [address]    Culture   Final    NO GROWTH 2 DAYS Performed at [facility], [address], [place], [place] [address]    Report Status PENDING  Incomplete    Radiology  Studies: DG Chest Portable 1 View Result Date: 10/02/2024 EXAM: 1 VIEW(S) XRAY OF THE CHEST 10/02/2024 12:13:00 PM COMPARISON: PA and lateral radiographs of the chest dated 04/13/2017. CLINICAL HISTORY: weakness FINDINGS: LUNGS AND PLEURA: Left base linear atelectasis versus scarring. No pleural effusion. No pneumothorax. HEART AND MEDIASTINUM: No acute abnormality of the cardiac and mediastinal silhouettes. BONES AND SOFT TISSUES: No acute osseous abnormality. IMPRESSION: 1. Left base linear atelectasis versus scarring. Electronically signed by: Evalene Coho MD 10/02/2024 12:53 PM EST RP Workstation: [id number]   Scheduled Meds:  amLODipine   10 mg Oral Daily   bisoprolol   2.5 mg Oral Daily   busPIRone   7.5 mg Oral TID   enoxaparin  (LOVENOX ) injection  40 mg Subcutaneous QHS   pantoprazole   40 mg Oral Daily   Continuous Infusions:  sodium chloride  40 mL/hr at 10/03/24 1552   cefTRIAXone  (ROCEPHIN )  IV 1 g (10/03/24 1326)     LOS: 1 day    Time spent: 35 Mins    Darcel Dawley, MD [facility]   If 7PM-7AM, please contact night-coverage

## 2024-10-05 ENCOUNTER — Other Ambulatory Visit (HOSPITAL_BASED_OUTPATIENT_CLINIC_OR_DEPARTMENT_OTHER): Payer: Self-pay

## 2024-10-05 LAB — COMPREHENSIVE METABOLIC PANEL WITH GFR
ALT: 76 U/L — ABNORMAL HIGH (ref 0–44)
AST: 79 U/L — ABNORMAL HIGH (ref 15–41)
Albumin: 2.7 g/dL — ABNORMAL LOW (ref 3.5–5.0)
Alkaline Phosphatase: 115 U/L (ref 38–126)
Anion gap: 14 (ref 5–15)
BUN: 9 mg/dL (ref 8–23)
CO2: 22 mmol/L (ref 22–32)
Calcium: 8.4 mg/dL — ABNORMAL LOW (ref 8.9–10.3)
Chloride: 99 mmol/L (ref 98–111)
Creatinine, Ser: 0.48 mg/dL — ABNORMAL LOW (ref 0.61–1.24)
GFR, Estimated: >60 mL/min (ref >60)
Glucose, Bld: 118 mg/dL — ABNORMAL HIGH (ref 70–99)
Potassium: 3.7 mmol/L (ref 3.5–5.1)
Sodium: 134 mmol/L — ABNORMAL LOW (ref 135–145)
Total Bilirubin: 0.6 mg/dL (ref 0.0–1.2)
Total Protein: 6.6 g/dL (ref 6.5–8.1)

## 2024-10-05 LAB — CBC
HCT: 38.6 % — ABNORMAL LOW (ref 39.0–52.0)
Hemoglobin: 13.5 g/dL (ref 13.0–17.0)
MCH: 31.5 pg (ref 26.0–34.0)
MCHC: 35 g/dL (ref 30.0–36.0)
MCV: 90 fL (ref 80.0–100.0)
Platelets: 377 K/uL (ref 150–400)
RBC: 4.29 MIL/uL (ref 4.22–5.81)
RDW: 13.3 % (ref 11.5–15.5)
WBC: 12.1 K/uL — ABNORMAL HIGH (ref 4.0–10.5)
nRBC: 0 % (ref 0.0–0.2)

## 2024-10-05 LAB — PHOSPHORUS: Phosphorus: 3 mg/dL (ref 2.5–4.6)

## 2024-10-05 LAB — MAGNESIUM: Magnesium: 2.2 mg/dL (ref 1.7–2.4)

## 2024-10-05 MED ORDER — CEFADROXIL 500 MG PO CAPS
500.0000 mg | ORAL_CAPSULE | Freq: Two times a day (BID) | ORAL | 0 refills | Status: AC
  Filled 2024-10-05: qty 8, 4d supply, fill #0

## 2024-10-05 NOTE — Plan of Care (Signed)

## 2024-10-05 NOTE — TOC Initial Note (Signed)
 Transition of Care [facility]) - Initial/Assessment Note    Patient Details  Name: Clinton Newton MRN: [id number] Date of Birth: 09-Jun-1951  Transition of Care [facility]) CM/SW Contact:    Doneta Glenys DASEN, RN Phone Number: 10/05/2024, 11:24 AM  Clinical Narrative:                 Patient agreeable to [facility] and chose [facility]. IP CM signing off  Expected Discharge Plan: Home w Home Health Services Barriers to Discharge: Continued Medical Work up, Barriers Resolved   Patient Goals and CMS Choice Patient states their goals for this hospitalization and ongoing recovery are:: Home with [facility] CMS Medicare.gov Compare Post Acute Care list provided to:: Patient Choice offered to / list presented to : Patient [place] ownership interest in [facility].provided to:: Patient    Expected Discharge Plan and Services In-house Referral: NA Discharge Planning Services: CM Consult   Living arrangements for the past 2 months: Single Family Home Expected Discharge Date: 10/05/24               DME Arranged: N/A DME Agency: NA       HH Arranged: PT, OT HH Agency: [facility] Agency Contacted: 10/05/24 Time HH Agency Contacted: 1115 Representative spoke with at [facility] Agency: Darle[place]  Prior Living Arrangements/Services Living arrangements for the past 2 months: Single Family Home Lives with:: Significant Other Patient language and need for interpreter reviewed:: Yes Do you feel safe going back to the place where you live?: Yes      Need for Family Participation in Patient Care: Yes (Comment) Care giver support system in place?: Yes (comment) Current home services:  (NA) Criminal Activity/Legal Involvement Pertinent to Current Situation/Hospitalization: No - Comment as needed  Activities of Daily Living   ADL Screening (condition at time of admission) Independently performs ADLs?: Yes (appropriate for developmental age) Is the patient deaf or have difficulty hearing?:  No Does the patient have difficulty seeing, even when wearing glasses/contacts?: No Does the patient have difficulty concentrating, remembering, or making decisions?: No  Permission Sought/Granted Permission sought to share information with : Case Manager Permission granted to share information with : Yes, Verbal Permission Granted  Share Information with NAME: Barr,Beth  Emergency Contact  [phone]  Permission granted to share info w AGENCY: [facility]        Emotional Assessment Appearance:: Appears stated age Attitude/Demeanor/Rapport: Engaged Affect (typically observed): Appropriate Orientation: : Oriented to Self, Oriented to Place, Oriented to  Time, Oriented to Situation Alcohol / Substance Use: Not Applicable Psych Involvement: No (comment)  Admission diagnosis:  Lower urinary tract infectious disease [N39.0] Hyponatremia [E87.1] UTI (urinary tract infection) [N39.0] Encephalopathy, unspecified type [G93.40] Patient Active Problem List   Diagnosis Date Noted   UTI (urinary tract infection) 10/02/2024   Cataract of both eyes 02/23/2024   CRP elevated 12/14/2023   Hemorrhoids 12/14/2023   BMI 30.0-30.9,adult 12/14/2023   Medication side effects 12/21/2022   Nocturia 12/21/2022   Other disorders of arteries, 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)  11/04/2022   Elevated blood sugar 11/04/2022   Right hip pain 04/06/2022   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   Haglund's deformity of left heel 02/18/2022   Plantar fasciitis of left foot 02/18/2022   Anxiety 05/09/2021   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   Ascending aorta dilation 10/12/2019   Lumbar degenerative disc disease 11/20/2016   Insomnia 03/14/2014   Hyperlipemia 08/31/2007   Essential hypertension  08/31/2007   PCP:  Oris Camie BRAVO, NP Pharmacy:   [facility] [address] [place]  [address] Phone: [phone] Fax: [phone]     Social Drivers of Health (SDOH) Social History: SDOH Screenings   Food Insecurity: No Food Insecurity (10/02/2024)  Housing: Low Risk (10/02/2024)  Transportation Needs: No Transportation Needs (10/02/2024)  Utilities: Not At Risk (10/02/2024)  Alcohol Screen: Low Risk (11/18/2023)  Depression (PHQ2-9): Low Risk (02/15/2024)  Financial Resource Strain: Low Risk (11/18/2023)  Physical Activity: Sufficiently Active (11/18/2023)  Social Connections: Moderately Integrated (10/02/2024)  Stress: Stress Concern Present (11/18/2023)  Tobacco Use: Low Risk (09/18/2024)  Health Literacy: Adequate Health Literacy (11/23/2023)   SDOH Interventions:     Readmission Risk Interventions    10/05/2024   11:18 AM  Readmission Risk Prevention Plan  Post Dischage Appt Complete  Medication Screening Complete  Transportation Screening Complete

## 2024-10-05 NOTE — Discharge Summary (Signed)
 Physician Discharge Summary   Patient: Clinton Newton MRN: [id number] DOB: [birth year]  Admit date:     10/02/2024  Discharge date: 10/05/2024  Discharge Physician: Yetta Blanch  PCP: Oris Camie BRAVO, NP  Recommendations at discharge: Follow-up with PCP in 1 week. Repeat CBC and BMP repeat LFT.   Contact information for follow-up providers     Early, Camie BRAVO, NP. Schedule an appointment as soon as possible for a visit in 1 week(s).   Specialty: Nurse Practitioner Why: repeat liver function test, with BMP lab to look at kidney/electrolyte numbers, with CBC lab to look at blood counts Contact information: [address] [place] [address] [phone]              Contact information for after-discharge care     Home Medical Care     [facility] [place] [facility]) .   Service: Home Health Services Contact information: [address] [place]  [address] [phone]                    Hospital Course: Patient with PMH of HTN, HLD, GERD, sciatica with recent epidural injection, BPH presented to the hospital with complaints of 2 near fall as well as generalized weakness. There is also some confusion as well at the time of the presentation. Patient's workup showed a possibility of a UTI and patient was treated with IV antibiotics.  Acute metabolic encephalopathy. Likely due to sepsis in the setting of UTI. Presents with confusion, generalized weakness. UA abnormal, had leukocytosis tachycardia meeting with SIRS criteria. Urine culture negative. Patient had a sodium of 127. CT scan unremarkable for any acute abnormality. At the time of my evaluation patient had no new focal deficit. Patient was started on IV ceftriaxone  for possible UTI with which patient reports significant improvement in his mentation. Given persistent leukocytosis with improvement as well as elevated LFTs I will recommend to continue antibiotic for 4 more  days for total 7-day treatment course. Outpatient follow-up with PCP recommended.  Hyponatremia. Likely from volume depletion in the setting of poor p.o. intake. Serum sodium on admission 127.  Currently 134. Recommend oral hydration.  LFT elevation. Likely in the setting of infection. Improving.  Recommend recheck.  Generalized weakness. Mechanical fall at home. Patient reports improvement in his mobility. Home health recommended. Outpatient follow-up with PCP recommended.  Sciatica. Recent injection and epidural area. No tenderness or active infection noted on the spine area. For now monitor.  HTN. On amlodipine  and bisoprolol .  Blood pressure stable.  Continue.  BPH. On alfuzosin . Recommend to resume it upon discharge.  Consultants:  None  Procedures performed:  None  DISCHARGE MEDICATION: Allergies as of 10/05/2024   No Known Allergies      Medication List     TAKE these medications    Advil 200 MG Caps Generic drug: Ibuprofen Take 800 mg by mouth every 8 (eight) hours as needed (for pain or headaches).   alfuzosin  10 MG 24 hr tablet Commonly known as: UROXATRAL  Take 1 tablet (10 mg total) by mouth daily.   amLODipine  10 MG tablet Commonly known as: NORVASC  Take 1 tablet (10 mg total) by mouth daily.   ascorbic acid 1000 MG tablet Commonly known as: VITAMIN C Take 1,000 mg by mouth daily.   bisoprolol  5 MG tablet Commonly known as: ZEBETA  Take 1/2 tablet (2.5 mg total) by mouth daily.   busPIRone  7.5 MG tablet Commonly known as: BUSPAR  Take 1 tablet (  7.5 mg total) by mouth 3 (three) times daily. What changed:  when to take this reasons to take this   cefadroxil 500 MG capsule Commonly known as: DURICEF Take 1 capsule (500 mg total) by mouth 2 (two) times daily for 4 days.   cyanocobalamin 1000 MCG tablet Commonly known as: VITAMIN B12 Take 1,000 mcg by mouth daily.   FISH OIL PO Take 1 capsule by mouth daily.   FLAXSEED OIL  PO Take 1 capsule by mouth daily.   HYDROcodone -acetaminophen  10-325 MG tablet Commonly known as: NORCO Take 1 tablet by mouth every 6 (six) hours as needed for severe pain (pain score 7-10).   hydrocortisone  25 MG suppository Commonly known as: ANUSOL -HC Place 1 suppository (25 mg total) rectally 2 (two) times daily as needed for hemorrhoids or anal itching.   meloxicam  15 MG tablet Commonly known as: MOBIC  Take 1 tablet (15 mg total) by mouth daily. What changed:  when to take this reasons to take this   methocarbamol  750 MG tablet Commonly known as: ROBAXIN  Take 1 tablet (750 mg total) by mouth 3 (three) times daily. What changed:  when to take this reasons to take this   multivitamin tablet Take 1 tablet by mouth daily with breakfast.   prednisoLONE  acetate 1 % ophthalmic suspension Commonly known as: PRED FORTE  Place 1 drop into both eyes 2 (two) times daily.   PriLOSEC OTC 20 MG tablet Generic drug: omeprazole Take 20 mg by mouth daily before breakfast.   PROBIOTIC PO Take 1 capsule by mouth daily.   rosuvastatin  20 MG tablet Commonly known as: CRESTOR  Take 1 tablet (20 mg total) by mouth daily.   traMADol  50 MG tablet Commonly known as: ULTRAM  Take 50 mg by mouth every 6 (six) hours as needed for moderate pain (pain score 4-6).   VITAMIN K2-VITAMIN D3 PO Take 1 capsule by mouth daily.   zolpidem  5 MG tablet Commonly known as: AMBIEN  Take 1 tablet (5 mg total) by mouth at bedtime as needed. for sleep       Disposition: Home Diet recommendation: Cardiac diet  Discharge Exam: Vitals:   10/04/24 0959 10/04/24 1218 10/04/24 2018 10/05/24 0600  BP: (!) 150/77 135/73 139/73 132/62  Pulse:  80 79 91  Resp:  18 18 16   Temp:  98.1 F (36.7 C) 98.1 F (36.7 C) 97.9 F (36.6 C)  TempSrc:  Oral Oral Oral  SpO2:  95% 95% 96%  Height:       General: in Mild distress, No Rash Cardiovascular: S1 and S2 Present, No Murmur Respiratory: Good respiratory  effort, Bilateral Air entry present. No Crackles, No wheezes Abdomen: Bowel Sound present, No tenderness Extremities: No edema Neuro: Alert and oriented x3, no new focal deficit  Condition at discharge: stable  The results of significant diagnostics from this hospitalization (including imaging, microbiology, ancillary and laboratory) are listed below for reference.   Imaging Studies: DG Chest Portable 1 View Result Date: 10/02/2024 EXAM: 1 VIEW(S) XRAY OF THE CHEST 10/02/2024 12:13:00 PM COMPARISON: PA and lateral radiographs of the chest dated 04/13/2017. CLINICAL HISTORY: weakness FINDINGS: LUNGS AND PLEURA: Left base linear atelectasis versus scarring. No pleural effusion. No pneumothorax. HEART AND MEDIASTINUM: No acute abnormality of the cardiac and mediastinal silhouettes. BONES AND SOFT TISSUES: No acute osseous abnormality. IMPRESSION: 1. Left base linear atelectasis versus scarring. Electronically signed by: Evalene Coho MD 10/02/2024 12:53 PM EST RP Workstation: [id number]   CT Head Wo Contrast Result Date: 10/02/2024 EXAM: CT HEAD  WITHOUT CONTRAST 10/02/2024 10:26:26 AM TECHNIQUE: CT of the head was performed without the administration of intravenous contrast. Automated exposure control, iterative reconstruction, and/or weight based adjustment of the mA/kV was utilized to reduce the radiation dose to as low as reasonably achievable. COMPARISON: None available. CLINICAL HISTORY: Mental status change, unknown cause FINDINGS: BRAIN AND VENTRICLES: No acute hemorrhage. No evidence of acute infarct. No hydrocephalus. No extra-axial collection. No mass effect or midline shift. ORBITS: Bilateral lens replacement. SINUSES: No acute abnormality. SOFT TISSUES AND SKULL: No acute soft tissue abnormality. No skull fracture. IMPRESSION: 1. No acute intracranial abnormality. Electronically signed by: Evalene Coho MD 10/02/2024 10:53 AM EST RP Workstation: [id number]   DG Epidural/Nerve  Root Result Date: 09/28/2024 EXAM: Fluoroscopic guided left L5-S1 transforaminal epidural steroid injection and nerve root block 09/28/2024 10:17:02 AM CLINICAL HISTORY: low back pain COMPARISON: None available TECHNIQUE: The risks, benefits, and alternatives were discussed with the patient. After informed written consent was obtained, the patient was placed in the prone position on the exam table. A time-out was performed prior to the procedure to confirm the patient's name, date of birth, and procedure being performed. The patient was prepped and draped in usual sterile fashion. 1 percent lidocaine  was provided for local anesthesia. A 22 gauge spinal needle was utilized to access the epidural space via a left posterolateral approach to the L5-S1 foramen. Loss of resistance technique was used. Placement was confirmed by injecting 1.0 ml Isovue  M-200. I then injected 80 mg of Medrol  and 1 ml 1% lidocaine . The patient tolerated the procedure well without immediate postprocedural complications. Fluoroscopy dose and type: Radiation dose index: Reference air kerma (in mgy) = 4.2 mgy IMPRESSION: 1. Successful fluoroscopic-guided left L5-S1 transforaminal epidural steroid injection and nerve root block using a 22-gauge spinal needle and left posterolateral approach. Injected 80 mg of Medrol  and 1 mL of 1% lidocaine . No complications. Electronically signed by: Lonni Necessary MD 09/28/2024 12:25 PM EST RP Workstation: [id number]    Microbiology: Results for orders placed or performed during the hospital encounter of 10/02/24  Urine Culture     Status: None   Collection Time: 10/02/24 12:44 PM   Specimen: Urine, Clean Catch  Result Value Ref Range Status   Specimen Description   Final    URINE, CLEAN CATCH Performed at [facility], [address], [place], [place] [address]    Special Requests   Final    NONE Performed at [facility], [address],  [place], [place] [address]    Culture   Final    NO GROWTH Performed at [facility], [address], [place], [place] [address]    Report Status 10/03/2024 FINAL  Final  Culture, blood (routine x 2)     Status: None (Preliminary result)   Collection Time: 10/02/24  1:00 PM   Specimen: BLOOD RIGHT ARM  Result Value Ref Range Status   Specimen Description   Final    BLOOD RIGHT ARM Performed at [facility], [address], [place], [place] [address]    Special Requests   Final    BOTTLES DRAWN AEROBIC AND ANAEROBIC Blood Culture results may not be optimal due to an inadequate volume of blood received in culture bottles Performed at [facility], [address], [place], [place] [address]    Culture   Final    NO GROWTH 3 DAYS Performed at [facility], [address] [place], [place]  [address]    Report Status PENDING  Incomplete  Culture, blood (routine x 2)     Status: None (Preliminary result)   Collection Time: 10/02/24  1:01 PM   Specimen: BLOOD LEFT ARM  Result Value Ref Range Status   Specimen Description   Final    BLOOD LEFT ARM Performed at [facility], [address], [place], [place] [address]    Special Requests   Final    BOTTLES DRAWN AEROBIC AND ANAEROBIC Blood Culture adequate volume Performed at [facility], [address] [place], [place] [address]    Culture   Final    NO GROWTH 3 DAYS Performed at [facility], [address], [place], [place] [address]    Report Status PENDING  Incomplete   Labs: CBC: Recent Labs  Lab 10/02/24 1020 10/03/24 0436 10/05/24 0449  WBC 14.5* 12.2* 12.1*  NEUTROABS 11.4*  --   --   HGB 14.6 13.3 13.5  HCT 42.7 38.6* 38.6*  MCV 92.6 92.6 90.0  PLT 390 335 377   Basic Metabolic Panel: Recent Labs  Lab 10/02/24 1020 10/03/24 0436 10/05/24 0449  NA 127* 129* 134*  K 3.7 3.6 3.7  CL 89* 96* 99  CO2 24 22 22   GLUCOSE 129* 129* 118*  BUN 16 11  9   CREATININE 0.89 0.53* 0.48*  CALCIUM  8.5* 8.0* 8.4*  MG  --   --  2.2  PHOS  --   --  3.0   Liver Function Tests: Recent Labs  Lab 10/02/24 1020 10/03/24 0436 10/05/24 0449  AST 110* 92* 79*  ALT 106* 87* 76*  ALKPHOS 121 124 115  BILITOT 1.0 0.8 0.6  PROT 7.4 6.5 6.6  ALBUMIN 3.0* 2.5* 2.7*   CBG: Recent Labs  Lab 10/02/24 0937  GLUCAP 121*    Discharge time spent: 35 minutes  Author: Yetta Blanch, MD  [facility] 10/05/2024

## 2024-10-06 ENCOUNTER — Other Ambulatory Visit: Payer: Self-pay

## 2024-10-06 ENCOUNTER — Ambulatory Visit: Admitting: Family Medicine

## 2024-10-06 ENCOUNTER — Encounter: Payer: Self-pay | Admitting: Family Medicine

## 2024-10-06 ENCOUNTER — Other Ambulatory Visit (HOSPITAL_BASED_OUTPATIENT_CLINIC_OR_DEPARTMENT_OTHER): Payer: Self-pay

## 2024-10-06 VITALS — BP 122/82 | HR 101 | Wt 177.6 lb

## 2024-10-06 DIAGNOSIS — K5900 Constipation, unspecified: Secondary | ICD-10-CM

## 2024-10-06 DIAGNOSIS — E871 Hypo-osmolality and hyponatremia: Secondary | ICD-10-CM | POA: Diagnosis not present

## 2024-10-06 DIAGNOSIS — K649 Unspecified hemorrhoids: Secondary | ICD-10-CM

## 2024-10-06 MED ORDER — POLYETHYLENE GLYCOL 3350 17 GM/SCOOP PO POWD
17.0000 g | Freq: Two times a day (BID) | ORAL | 1 refills | Status: AC | PRN
  Filled 2024-10-06: qty 1020, 30d supply, fill #0

## 2024-10-06 MED ORDER — HYDROCORTISONE ACETATE 25 MG RE SUPP
25.0000 mg | Freq: Two times a day (BID) | RECTAL | 4 refills | Status: AC | PRN
  Filled 2024-10-06: qty 24, 12d supply, fill #0

## 2024-10-06 NOTE — Patient Instructions (Addendum)
 VISIT SUMMARY:  During your visit, we discussed your chronic back pain, recent weakness, low sodium levels, and constipation. You received significant relief from your back pain after an epidural injection, but you are still experiencing some soreness and weakness. We also addressed your recent hospitalization due to a fall and low sodium levels, and your current issues with appetite and constipation.  YOUR PLAN:  -LUMBAR RADICULOPATHY: Lumbar radiculopathy is a condition where the nerves in your lower back are irritated or compressed, causing pain and weakness. You experienced significant relief from your recent epidural steroid injection. We will continue to manage your pain with these injections every three months as needed. We will monitor for any unilateral weakness, which may require further intervention. To prevent low sodium levels related to the injections, you should increase your salt intake post-injection.  -HYPONATREMIA: Hyponatremia is a condition where your blood sodium levels are too low. This recent episode was likely caused by the epidural steroid injection. Your sodium levels have normalized since your hospital discharge. To prevent future episodes, increase your dietary salt intake post-injection, ensure adequate hydration with fluids like Gatorade, and we will monitor your sodium levels with basic labs.  -CONSTIPATION WITH DIVERTICULOSIS OF COLON: Constipation is difficulty in passing stools, and diverticulosis is a condition where small pouches form in the colon. Your constipation is likely due to low fiber intake after your recent illness and hospitalization. You should start taking Miralax, beginning with one packet daily and increasing to two as needed. Ensure you drink plenty of fluids, including water and occasional Gatorade. Gradually reintroduce a high fiber diet as your appetite improves.  INSTRUCTIONS:  Please follow up with us  if you experience any new or worsening  symptoms. We will monitor your sodium levels with basic labs and adjust your treatment plan as needed. Continue with the recommended dietary changes and medications to manage your conditions. Please purchase boost or ensure to increase your appetite.

## 2024-10-06 NOTE — Progress Notes (Signed)
 Name: Clinton Newton   Date of Visit: 10/06/2024   Date of last visit with me: 09/18/2024   CHIEF COMPLAINT:  Chief Complaint  Patient presents with   Follow-up    Hospital follow up for UTI. Had a fall, lead to the ED, found a UTI. Sharp pain in lower left abdomin. Small amount of constipation. Not much appetites. Wants repeat lab work.        HPI:  Discussed the use of AI scribe software for clinical note transcription with the patient, who gave verbal consent to proceed.  History of Present Illness   Clinton Newton is a 73 year old male with chronic back pain who presents with weakness and low sodium levels.  He has chronic back pain. An epidural injection on September 13, 2024, provided significant relief after four days, but he continues to experience persistent soreness due to degenerative changes in his back. Although the pain has improved, he is concerned about weakness, particularly in one leg.  He was recently hospitalized due to a fall, which he attributes to weakness and confusion. During the hospital stay, he was informed of a possible urinary tract infection (UTI), but subsequent lab results showed no significant bacterial growth. He reports difficulty initiating and completing urination, which may have contributed to the initial suspicion of a UTI. He was also found to have low sodium levels.  Since the hospitalization, he has experienced a decrease in appetite, which he attributes to the recent illness and the injection. He has not been eating much, affecting his nutritional intake, including fiber. He has a history of diverticulosis, which he manages with a high-fiber diet, but he has not been able to maintain this due to his current lack of appetite. He has not taken Miralax since his hospital discharge.         OBJECTIVE:       02/15/2024    2:04 PM  Depression screen PHQ 2/9  Decreased Interest 0  Down, Depressed, Hopeless 0  PHQ - 2 Score 0     BP  Readings from Last 3 Encounters:  10/06/24 122/82  10/05/24 132/62  09/28/24 (!) 133/59    BP 122/82   Pulse (!) 101   Wt 177 lb 9.6 oz (80.6 kg)   SpO2 98%   BMI 27.00 kg/m    Physical Exam          Physical Exam Constitutional:      Appearance: Normal appearance.  Neurological:     General: No focal deficit present.     Mental Status: He is alert and oriented to person, place, and time. Mental status is at baseline.  Psychiatric:        Mood and Affect: Mood normal.        Behavior: Behavior normal.        Thought Content: Thought content normal.        Judgment: Judgment normal.     ASSESSMENT/PLAN:   Assessment & Plan Hemorrhoids, unspecified hemorrhoid type  Hyponatremia  Constipation, unspecified constipation type    Assessment and Plan    Lumbar radiculopathy Chronic lumbar radiculopathy with significant relief from recent epidural steroid injection. No surgical intervention needed. Pain management prioritized. 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 - Administer epidural steroid injections every three months as needed. - Monitor for unilateral weakness indicating need for further intervention. - Increase salt intake post-injection to prevent SIADH-related hyponatremia.  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 Sodium  levels normalized post-discharge. No long-term concerns. - Increase dietary salt intake post-injection. - Ensure adequate hydration with fluids like Gatorade. - Monitor sodium levels with basic labs.  Constipation with diverticulosis of colon Constipation likely due to low fiber intake post-illness and hospitalization. Managed with high fiber diet previously. - Supplement with Miralax, starting with one packet daily, increasing to two as needed. - Ensure adequate fluid intake, including water and occasional Gatorade. - Gradually reintroduce high fiber diet as appetite  improves.         Clinton Newton A. Vita MD [facility]

## 2024-10-07 LAB — BASIC METABOLIC PANEL WITH GFR
BUN/Creatinine Ratio: 15 (ref 10–24)
BUN: 8 mg/dL (ref 8–27)
CO2: 19 mmol/L — AB (ref 20–29)
Calcium: 8.3 mg/dL — AB (ref 8.6–10.2)
Chloride: 99 mmol/L (ref 96–106)
Creatinine, Ser: 0.54 mg/dL — AB (ref 0.76–1.27)
Glucose: 130 mg/dL — AB (ref 70–99)
Potassium: 4.2 mmol/L (ref 3.5–5.2)
Sodium: 137 mmol/L (ref 134–144)
eGFR: 107 mL/min/1.73 (ref >59)

## 2024-10-07 LAB — CBC WITH DIFFERENTIAL/PLATELET
Basophils Absolute: 0.1 x10E3/uL (ref 0.0–0.2)
Basos: 1 %
EOS (ABSOLUTE): 0 x10E3/uL (ref 0.0–0.4)
Eos: 0 %
Hematocrit: 43.8 % (ref 37.5–51.0)
Hemoglobin: 14.8 g/dL (ref 13.0–17.7)
Immature Grans (Abs): 0.3 x10E3/uL — ABNORMAL HIGH (ref 0.0–0.1)
Immature Granulocytes: 2 %
Lymphocytes Absolute: 1 x10E3/uL (ref 0.7–3.1)
Lymphs: 8 %
MCH: 30.9 pg (ref 26.6–33.0)
MCHC: 33.8 g/dL (ref 31.5–35.7)
MCV: 91 fL (ref 79–97)
Monocytes Absolute: 1.3 x10E3/uL — ABNORMAL HIGH (ref 0.1–0.9)
Monocytes: 10 %
Neutrophils Absolute: 10.1 x10E3/uL — ABNORMAL HIGH (ref 1.4–7.0)
Neutrophils: 79 %
Platelets: 438 x10E3/uL (ref 150–450)
RBC: 4.79 x10E6/uL (ref 4.14–5.80)
RDW: 12.9 % (ref 11.6–15.4)
WBC: 12.7 x10E3/uL — ABNORMAL HIGH (ref 3.4–10.8)

## 2024-10-07 LAB — CULTURE, BLOOD (ROUTINE X 2)
Culture: NO GROWTH
Culture: NO GROWTH
Special Requests: ADEQUATE

## 2024-10-09 ENCOUNTER — Telehealth: Payer: Self-pay

## 2024-10-09 ENCOUNTER — Other Ambulatory Visit (HOSPITAL_BASED_OUTPATIENT_CLINIC_OR_DEPARTMENT_OTHER): Payer: Self-pay

## 2024-10-09 ENCOUNTER — Ambulatory Visit: Payer: Self-pay | Admitting: Family Medicine

## 2024-10-09 MED ORDER — DOXYCYCLINE HYCLATE 100 MG PO CAPS
100.0000 mg | ORAL_CAPSULE | Freq: Two times a day (BID) | ORAL | 0 refills | Status: DC
  Filled 2024-10-09: qty 20, 10d supply, fill #0

## 2024-10-09 NOTE — Telephone Encounter (Signed)
 Copied from CRM #[id number]. Topic: Clinical - Home Health Verbal Orders >> Oct 06, 2024  4:52 PM Everette C wrote: Caller/Agency: Lorrene IVER Hedda Mitch Number: [phone] Service Requested: Physical Therapy  Any new concerns about the patient? Yes. Patient has requested a delay in care until 10/09/24

## 2024-10-10 ENCOUNTER — Telehealth: Payer: Self-pay

## 2024-10-10 NOTE — Progress Notes (Signed)
..  Complex Care Management   10/10/2024  Name: Clinton Newton  MRN: [id number]  DOB: Dec 05, 1950  Inbound call received from Dempsey LITTIE Che after receiving an Interactive Voice Response (IVR) call after a recent hospitalization. Information was provided about Care Management services.  Follow up plan: No further follow up required   Leita Lyme, BLANCH CAULK [facility]  [facility], [facility] Care Management Assistant [phone]

## 2024-10-16 ENCOUNTER — Other Ambulatory Visit: Payer: Self-pay

## 2024-10-24 ENCOUNTER — Encounter: Payer: Self-pay | Admitting: Nurse Practitioner

## 2024-10-24 ENCOUNTER — Ambulatory Visit: Admitting: Nurse Practitioner

## 2024-10-24 ENCOUNTER — Other Ambulatory Visit (HOSPITAL_BASED_OUTPATIENT_CLINIC_OR_DEPARTMENT_OTHER): Payer: Self-pay

## 2024-10-24 VITALS — BP 100/62 | HR 72 | Ht 67.0 in | Wt 170.6 lb

## 2024-10-24 DIAGNOSIS — E785 Hyperlipidemia, unspecified: Secondary | ICD-10-CM | POA: Diagnosis not present

## 2024-10-24 DIAGNOSIS — R5383 Other fatigue: Secondary | ICD-10-CM

## 2024-10-24 DIAGNOSIS — D649 Anemia, unspecified: Secondary | ICD-10-CM

## 2024-10-24 DIAGNOSIS — K5792 Diverticulitis of intestine, part unspecified, without perforation or abscess without bleeding: Secondary | ICD-10-CM | POA: Diagnosis not present

## 2024-10-24 DIAGNOSIS — N390 Urinary tract infection, site not specified: Secondary | ICD-10-CM | POA: Diagnosis not present

## 2024-10-24 DIAGNOSIS — H269 Unspecified cataract: Secondary | ICD-10-CM | POA: Diagnosis not present

## 2024-10-24 DIAGNOSIS — R590 Localized enlarged lymph nodes: Secondary | ICD-10-CM

## 2024-10-24 DIAGNOSIS — J111 Influenza due to unidentified influenza virus with other respiratory manifestations: Secondary | ICD-10-CM | POA: Diagnosis not present

## 2024-10-24 DIAGNOSIS — Z791 Long term (current) use of non-steroidal anti-inflammatories (NSAID): Secondary | ICD-10-CM | POA: Diagnosis not present

## 2024-10-24 DIAGNOSIS — R7982 Elevated C-reactive protein (CRP): Secondary | ICD-10-CM

## 2024-10-24 DIAGNOSIS — A419 Sepsis, unspecified organism: Secondary | ICD-10-CM | POA: Diagnosis not present

## 2024-10-24 DIAGNOSIS — R634 Abnormal weight loss: Secondary | ICD-10-CM | POA: Diagnosis not present

## 2024-10-24 DIAGNOSIS — I10 Essential (primary) hypertension: Secondary | ICD-10-CM

## 2024-10-24 DIAGNOSIS — E871 Hypo-osmolality and hyponatremia: Secondary | ICD-10-CM | POA: Diagnosis not present

## 2024-10-24 DIAGNOSIS — M5432 Sciatica, left side: Secondary | ICD-10-CM | POA: Diagnosis not present

## 2024-10-24 DIAGNOSIS — K219 Gastro-esophageal reflux disease without esophagitis: Secondary | ICD-10-CM | POA: Diagnosis not present

## 2024-10-24 DIAGNOSIS — M47816 Spondylosis without myelopathy or radiculopathy, lumbar region: Secondary | ICD-10-CM | POA: Diagnosis not present

## 2024-10-24 DIAGNOSIS — G9340 Encephalopathy, unspecified: Secondary | ICD-10-CM | POA: Diagnosis not present

## 2024-10-24 DIAGNOSIS — Z860100 Personal history of colon polyps, unspecified: Secondary | ICD-10-CM | POA: Diagnosis not present

## 2024-10-24 MED ORDER — OSELTAMIVIR PHOSPHATE 75 MG PO CAPS
75.0000 mg | ORAL_CAPSULE | Freq: Two times a day (BID) | ORAL | 0 refills | Status: AC
  Filled 2024-10-24: qty 10, 5d supply, fill #0

## 2024-10-24 MED ORDER — DOXYCYCLINE HYCLATE 100 MG PO CAPS
100.0000 mg | ORAL_CAPSULE | Freq: Two times a day (BID) | ORAL | 0 refills | Status: DC
  Filled 2024-10-24: qty 30, 15d supply, fill #0

## 2024-10-24 NOTE — Patient Instructions (Addendum)
 If you are exposed to the flu, but have no symptoms. Start Tamiflu  immediately. Take 1 tablet by mouth daily for 5-7 days.  If you have flu symptoms, start taking the Tamiflu  (1 tablet by mouth twice a day for 5 days) immediately at the onset of symptoms.   We will get repeat labs today. I will let you know if there are any concerns.   Call and see if you can get scheduled with GI as soon as possible so we can make sure we are doing everything we need to do.   I will reach out to you with any other recommendations.

## 2024-10-24 NOTE — Progress Notes (Unsigned)
 " [name], DNP, AGNP-c [facility] [address] [place], [place] [address] Main Office [phone]  ESTABLISHED PATIENT- Hospital Follow-Up Visit on 10/24/2024 Today's Vitals   10/24/24 1335  BP: 100/62  Pulse: 72  SpO2: 97%  Weight: 170 lb 9.6 oz (77.4 kg)  Height: 5' 7 (1.702 m)   Body mass index is 26.72 kg/m.  Wt Readings from Last 3 Encounters:  10/24/24 170 lb 9.6 oz (77.4 kg)  10/06/24 177 lb 9.6 oz (80.6 kg)  09/18/24 191 lb 3.2 oz (86.7 kg)     Subjective:  other (F/U on Hospital Visit- Having recurring issues from diverticuliitis, such as a hot spot, Wants a referral to GI)  History of Present Illness Clinton Newton is a 73 year old male with diverticulitis who presents for hospital f/u with recurrent abdominal pain.  He experiences recurrent sharp abdominal pain localized to a specific area (LLQ). The pain had previously subsided with antibiotics but has recently returned. He took a remaining dose of doxycycline  last night, which provided relief.  He has a history of diverticulitis diagnosed 17 years ago. A recent CT scan, as relayed to him, showed findings of diverticulitis and enlarged lymph nodes in the abdominal retroperitoneum and small bowel mesentery.  He reports significant weight loss due to a recent hospital stay where he was unable to eat properly, leading to a lack of fiber intake and a subsequent flare-up of diverticulitis. No ongoing weight loss outside of this event. No night sweats, fevers, or weakness outside of recent illness.   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 His bowel movements have returned to normal, and he is not experiencing any bleeding.  He reports urinary issues, specifically a lower volume of urination than expected, but notes improvement. His sciatica has significantly  improved, with only a minor residual tingle.  He is currently taking doxycycline , which he tolerates well, and bisoprolol  for blood pressure management.  Past medical history, surgical history, medications, allergies, family history and social history reviewed with patient today and changes made to appropriate areas of the chart.      Objective:    Physical Exam Vitals and nursing note reviewed.  Constitutional:      General: He is not in acute distress.    Appearance: Normal appearance. He is ill-appearing.  HENT:     Head: Normocephalic.  Eyes:     General: No scleral icterus.    Pupils: Pupils are equal, round, and reactive to light.  Neck:     Vascular: No carotid bruit.  Cardiovascular:     Rate and Rhythm: Normal rate and regular rhythm.     Pulses: Normal pulses.     Heart sounds: Normal heart sounds.  Pulmonary:     Effort: Pulmonary effort is normal.     Breath sounds: Normal breath sounds.  Abdominal:     General: Bowel sounds are normal. There is no distension.     Palpations: Abdomen is soft. There is no mass.     Tenderness: There is abdominal tenderness. There is no right CVA tenderness, left CVA tenderness, guarding or rebound.     Hernia: No hernia is present.  Musculoskeletal:        General: Normal range of motion.     Cervical back: Normal range of motion.     Right lower leg: No edema.  Left lower leg: No edema.  Skin:    General: Skin is warm and dry.     Capillary Refill: Capillary refill takes less than 2 seconds.     Coloration: Skin is pale.  Neurological:     General: No focal deficit present.     Mental Status: He is alert and oriented to person, place, and time.     Sensory: No sensory deficit.     Motor: Weakness present.     Coordination: Coordination normal.     Gait: Gait normal.  Psychiatric:        Mood and Affect: Mood normal.         Assessment & Plan:   Assessment & Plan Diverticulitis Recurrent diverticulitis with  sharp abdominal pain, previously managed with doxycycline . CT scan shows two sites of diverticulitis and enlarged lymph nodes. Symptoms improved with doxycycline , indicating effectiveness. High fiber diet maintained to prevent recurrence. - Continue doxycycline  until symptoms resolve, then continue for 2-3 more days. - Referred to GI specialist for further evaluation. - Monitor symptoms and adjust treatment if necessary. Orders:   doxycycline  (VIBRAMYCIN ) 100 MG capsule; Take 1 capsule (100 mg total) by mouth 2 (two) times daily.  Lymphadenopathy, retroperitoneal Weight loss Fatigue, unspecified type CRP elevated Hypocalcemia CT scan shows bulky adenopathy in the retroperitoneum and small bowel mesentery, with lymph nodes up to 1.8 cm and a group measuring 4.3 x 5.1 cm. Differential includes reactive lymphadenopathy due to infection vs. reaction of potential abscess vs lymphoma. Labs show elevated neutrophils and immature granulocytes. Sedimentation rate, and C-reactive protein are elevated, indicating inflammation, which is expected with infection, but not clearly diagnostic. No significant lymphadenopathy in other areas. Weight loss attributed to recent hospitalization and dietary changes- no changes outside of this event. No alarm symptoms are present.  - Ordered labs to monitor white blood cell counts, lactate dehydrogenase, sedimentation rate, and C-reactive protein again. - Recommend follow-up with GI ASAP for ongoing monitoring.  - Will discuss course with hem/onc to determine if additional interventions or screenings are recommended at this time.  - Continue monitoring for changes in symptoms or lab results.  Fatigue and weight loss attributed to recent hospitalization and dietary changes. No ongoing weight loss. Fatigue likely due to recovery from hospitalization and ongoing infection. - Continue to monitor weight and fatigue levels. - Encouraged high fiber diet to support  recovery.   Orders:   CBC with Differential/Platelet   Comprehensive metabolic panel with GFR   Iron, TIBC and Ferritin Panel   Sedimentation rate   C-reactive protein   Lactate Dehydrogenase   Calcium , ionized  Influenza High risk of influenza exposure due to social activities. Discussed prophylaxis with Tamiflu  if exposed to flu or if symptoms develop. - Prescribed Tamiflu  for prophylaxis and treatment of influenza. - Instructed to start Tamiflu  immediately if exposed to flu or if symptoms develop. Orders:   oseltamivir  (TAMIFLU ) 75 MG capsule; Take 1 capsule (75 mg total) by mouth 2 (two) times daily. Take with food.  Low hematocrit Previous low red blood cells and decreased hematocrit. Current labs to be reviewed for any changes. - Ordered labs to assess current hematological status.    Essential hypertension Blood pressure low today, possibly due to infection and medication. Currently on bisoprolol  and amlodipine . - Monitor blood pressure at home. - Consider reducing amlodipine  dose if blood pressure remains low.         SaraBeth Darey Hershberger, DNP, AGNP-c  65 minutes spent on care provided today.  Time includes care provided during the visit (>50% total time), care coordination, chart review, and documentation.  "

## 2024-10-25 NOTE — Assessment & Plan Note (Signed)
 CT scan shows bulky adenopathy in the retroperitoneum and small bowel mesentery, with lymph nodes up to 1.8 cm and a group measuring 4.3 x 5.1 cm. Differential includes reactive lymphadenopathy due to infection vs. reaction of potential abscess vs lymphoma. Labs show elevated neutrophils and immature granulocytes. Sedimentation rate, and C-reactive protein are elevated, indicating inflammation, which is expected with infection, but not clearly diagnostic. No significant lymphadenopathy in other areas. Weight loss attributed to recent hospitalization and dietary changes- no changes outside of this event. No alarm symptoms are present.  - Ordered labs to monitor white blood cell counts, lactate dehydrogenase, sedimentation rate, and C-reactive protein again. - Recommend follow-up with GI ASAP for ongoing monitoring.  - Will discuss course with hem/onc to determine if additional interventions or screenings are recommended at this time.  - Continue monitoring for changes in symptoms or lab results.  Fatigue and weight loss attributed to recent hospitalization and dietary changes. No ongoing weight loss. Fatigue likely due to recovery from hospitalization and ongoing infection. - Continue to monitor weight and fatigue levels. - Encouraged high fiber diet to support recovery.   Orders:   CBC with Differential/Platelet   Comprehensive metabolic panel with GFR   Iron, TIBC and Ferritin Panel   Sedimentation rate   C-reactive protein   Lactate Dehydrogenase   Calcium , ionized

## 2024-10-25 NOTE — Assessment & Plan Note (Signed)
 Blood pressure low today, possibly due to infection and medication. Currently on bisoprolol  and amlodipine . - Monitor blood pressure at home. - Consider reducing amlodipine  dose if blood pressure remains low.

## 2024-10-26 LAB — SEDIMENTATION RATE: Sed Rate: 82 mm/h — ABNORMAL HIGH (ref 0–30)

## 2024-10-26 LAB — COMPREHENSIVE METABOLIC PANEL WITH GFR
ALT: 25 IU/L (ref 0–44)
AST: 21 IU/L (ref 0–40)
Albumin: 3.9 g/dL (ref 3.8–4.8)
Alkaline Phosphatase: 77 IU/L (ref 47–123)
BUN/Creatinine Ratio: 23 (ref 10–24)
BUN: 17 mg/dL (ref 8–27)
Bilirubin Total: 0.4 mg/dL (ref 0.0–1.2)
CO2: 22 mmol/L (ref 20–29)
Calcium: 9.5 mg/dL (ref 8.6–10.2)
Chloride: 106 mmol/L (ref 96–106)
Creatinine, Ser: 0.75 mg/dL — ABNORMAL LOW (ref 0.76–1.27)
Globulin, Total: 2.5 g/dL (ref 1.5–4.5)
Glucose: 94 mg/dL (ref 70–99)
Potassium: 4.1 mmol/L (ref 3.5–5.2)
Sodium: 143 mmol/L (ref 134–144)
Total Protein: 6.4 g/dL (ref 6.0–8.5)
eGFR: 96 mL/min/1.73

## 2024-10-26 LAB — CBC WITH DIFFERENTIAL/PLATELET
Basophils Absolute: 0 x10E3/uL (ref 0.0–0.2)
Basos: 0 %
EOS (ABSOLUTE): 0 x10E3/uL (ref 0.0–0.4)
Eos: 0 %
Hematocrit: 38.8 % (ref 37.5–51.0)
Hemoglobin: 13.1 g/dL (ref 13.0–17.7)
Immature Grans (Abs): 0 x10E3/uL (ref 0.0–0.1)
Immature Granulocytes: 0 %
Lymphocytes Absolute: 1.1 x10E3/uL (ref 0.7–3.1)
Lymphs: 15 %
MCH: 31.4 pg (ref 26.6–33.0)
MCHC: 33.8 g/dL (ref 31.5–35.7)
MCV: 93 fL (ref 79–97)
Monocytes Absolute: 0.9 x10E3/uL (ref 0.1–0.9)
Monocytes: 12 %
Neutrophils Absolute: 5.6 x10E3/uL (ref 1.4–7.0)
Neutrophils: 73 %
Platelets: 297 x10E3/uL (ref 150–450)
RBC: 4.17 x10E6/uL (ref 4.14–5.80)
RDW: 13.3 % (ref 11.6–15.4)
WBC: 7.7 x10E3/uL (ref 3.4–10.8)

## 2024-10-26 LAB — IRON,TIBC AND FERRITIN PANEL
Ferritin: 806 ng/mL — ABNORMAL HIGH (ref 30–400)
Iron Saturation: 14 % — ABNORMAL LOW (ref 15–55)
Iron: 30 ug/dL — ABNORMAL LOW (ref 38–169)
Total Iron Binding Capacity: 209 ug/dL — ABNORMAL LOW (ref 250–450)
UIBC: 179 ug/dL (ref 111–343)

## 2024-10-26 LAB — CALCIUM, IONIZED: Calcium, Ion: 5.2 mg/dL (ref 4.5–5.6)

## 2024-10-26 LAB — C-REACTIVE PROTEIN: CRP: 39 mg/L — ABNORMAL HIGH (ref 0–10)

## 2024-10-26 LAB — LACTATE DEHYDROGENASE: LDH: 180 IU/L (ref 121–224)

## 2024-10-27 ENCOUNTER — Encounter: Payer: Self-pay | Admitting: Gastroenterology

## 2024-10-27 ENCOUNTER — Ambulatory Visit: Admitting: Gastroenterology

## 2024-10-27 VITALS — BP 100/54 | HR 86 | Ht 67.0 in | Wt 172.0 lb

## 2024-10-27 DIAGNOSIS — K5732 Diverticulitis of large intestine without perforation or abscess without bleeding: Secondary | ICD-10-CM

## 2024-10-27 DIAGNOSIS — K5900 Constipation, unspecified: Secondary | ICD-10-CM | POA: Diagnosis not present

## 2024-10-27 DIAGNOSIS — R634 Abnormal weight loss: Secondary | ICD-10-CM | POA: Diagnosis not present

## 2024-10-27 DIAGNOSIS — Z860100 Personal history of colon polyps, unspecified: Secondary | ICD-10-CM

## 2024-10-27 DIAGNOSIS — R9389 Abnormal findings on diagnostic imaging of other specified body structures: Secondary | ICD-10-CM | POA: Diagnosis not present

## 2024-10-27 DIAGNOSIS — R1032 Left lower quadrant pain: Secondary | ICD-10-CM

## 2024-10-27 DIAGNOSIS — Z860101 Personal history of adenomatous and serrated colon polyps: Secondary | ICD-10-CM

## 2024-10-27 NOTE — Progress Notes (Signed)
 "  Chief Complaint: abdominal wall pain in left lower quadrant  Primary GI Doctor: (previously Dr. Aneita) Dr. San  HPI:  Patient is a  74  year old male patient with past medical history of HTN, HLD, GERD, sciatica, BPH, who was referred to me by Oris Camie BRAVO, NP on 10/17/24 for a evaluation of abdominal wall pain in left lower quadrant .    10/05/2024 patient seen in ED for generalized weakness and confusion.  Patient's workup showed a possible UTI and patient was treated with IV antibiotics. Urine culture negative. Patient had a sodium of 127. CT head scan unremarkable for any acute abnormality.  Interval History Patient presents for follow-up for recent bout of diverticulitis.  He has a history of diverticulitis, last episode 2017. A recent CT scan ordered from urology showed findings of diverticulitis and enlarged lymph nodes in the abdominal retroperitoneum and small bowel mesentery. Concerns for possible lymphoma.  Patient reports he was having issues with constipation and left lower quadrant pain following hospitalization in December which led to the episode.  Patient believes he was prescribed antibiotics at the time which he completed and then recently was given refill by his PCP for persistent LLQ pain. He reports since restarting the antibiotics 3 days ago his symptoms have improved. He anticipates a referral to oncology and reports they Candyce Gambino be ordering further imaging. He has follow-up with urology next week.   He reports significant weight loss due to a recent hospital stay where he was unable to eat properly, has slowly started to increase his weight again. He reports generalized fatigue and weakness since discharge.   Wt Readings from Last 3 Encounters:  10/27/24 172 lb (78 kg)  10/24/24 170 lb 9.6 oz (77.4 kg)  10/06/24 177 lb 9.6 oz (80.6 kg)    Past Medical History:  Diagnosis Date   Arthritis    L3-L4   Cataract    Colon polyps    Diverticulitis    GERD  (gastroesophageal reflux disease)    Hyperlipidemia    Hypertension     Past Surgical History:  Procedure Laterality Date   APPENDECTOMY     COLONOSCOPY     POLYPECTOMY     PRE-MALIGNANT / BENIGN SKIN LESION EXCISION      Current Outpatient Medications  Medication Sig Dispense Refill   ADVIL 200 MG CAPS Take 800 mg by mouth every 8 (eight) hours as needed (for pain or headaches).     alfuzosin  (UROXATRAL ) 10 MG 24 hr tablet Take 1 tablet (10 mg total) by mouth daily. 90 tablet 3   amLODipine  (NORVASC ) 10 MG tablet Take 1 tablet (10 mg total) by mouth daily. 90 tablet 1   bisoprolol  (ZEBETA ) 5 MG tablet Take 1/2 tablet (2.5 mg total) by mouth daily. 45 tablet 1   busPIRone  (BUSPAR ) 7.5 MG tablet Take 1 tablet (7.5 mg total) by mouth 3 (three) times daily. (Patient taking differently: Take 7.5 mg by mouth as needed (for anxiety).) 270 tablet 3   Flaxseed, Linseed, (FLAXSEED OIL PO) Take 1 capsule by mouth daily.     hydrocortisone  (ANUSOL -HC) 25 MG suppository Place 1 suppository (25 mg total) rectally 2 (two) times daily as needed for hemorrhoids or anal itching. 24 suppository 4   Multiple Vitamin (MULTIVITAMIN) tablet Take 1 tablet by mouth daily with breakfast.     Omega-3 Fatty Acids (FISH OIL PO) Take 1 capsule by mouth daily.     polyethylene glycol powder (GLYCOLAX /MIRALAX ) 17 GM/SCOOP powder  Take 17 g by mouth 2 (two) times daily as needed. Dissolve 1 capful (17g) in 4-8 ounces of liquid and take by mouth daily. 3350 g 1   PRILOSEC OTC 20 MG tablet Take 20 mg by mouth daily before breakfast.     Probiotic Product (PROBIOTIC PO) Take 1 capsule by mouth daily.     rosuvastatin  (CRESTOR ) 20 MG tablet Take 1 tablet (20 mg total) by mouth daily. 90 tablet 1   vitamin B-12 (CYANOCOBALAMIN) 1000 MCG tablet Take 1,000 mcg by mouth daily.     Vitamin D-Vitamin K (VITAMIN K2-VITAMIN D3 PO) Take 1 capsule by mouth daily.     zolpidem  (AMBIEN ) 5 MG tablet Take 1 tablet (5 mg total) by mouth  at bedtime as needed. for sleep 30 tablet 3   ascorbic acid (VITAMIN C) 1000 MG tablet Take 1,000 mg by mouth daily. (Patient not taking: Reported on 10/24/2024)     doxycycline  (VIBRAMYCIN ) 100 MG capsule Take 1 capsule (100 mg total) by mouth 2 (two) times daily. 30 capsule 0   HYDROcodone -acetaminophen  (NORCO) 10-325 MG tablet Take 1 tablet by mouth every 6 (six) hours as needed for severe pain (pain score 7-10). (Patient not taking: Reported on 10/24/2024)     methocarbamol  (ROBAXIN ) 750 MG tablet Take 1 tablet (750 mg total) by mouth 3 (three) times daily. (Patient not taking: Reported on 10/24/2024) 270 tablet 1   oseltamivir  (TAMIFLU ) 75 MG capsule Take 1 capsule (75 mg total) by mouth 2 (two) times daily. Take with food. (Patient not taking: Reported on 10/27/2024) 10 capsule 0   prednisoLONE  acetate (PRED FORTE ) 1 % ophthalmic suspension Place 1 drop into both eyes 2 (two) times daily. (Patient not taking: Reported on 10/24/2024) 10 mL 1   traMADol  (ULTRAM ) 50 MG tablet Take 50 mg by mouth every 6 (six) hours as needed for moderate pain (pain score 4-6). (Patient not taking: Reported on 10/24/2024)     No current facility-administered medications for this visit.    Allergies as of 10/27/2024   (No Known Allergies)    Family History  Problem Relation Age of Onset   Heart disease Father    Colon cancer Neg Hx    Pancreatic cancer Neg Hx    Rectal cancer Neg Hx    Stomach cancer Neg Hx    Esophageal cancer Neg Hx    Colon polyps Neg Hx     Review of Systems:    Constitutional: No weight loss, fever, chills, weakness or fatigue HEENT: Eyes: No change in vision               Ears, Nose, Throat:  No change in hearing or congestion Skin: No rash or itching Cardiovascular: No chest pain, chest pressure or palpitations   Respiratory: No SOB or cough Gastrointestinal: See HPI and otherwise negative Genitourinary: No dysuria or change in urinary frequency Neurological: No headache,  dizziness or syncope Musculoskeletal: No new muscle or joint pain Hematologic: No bleeding or bruising Psychiatric: No history of depression or anxiety    Physical Exam:  Vital signs: BP (!) 100/54   Pulse 86   Ht 5' 7 (1.702 m)   Wt 172 lb (78 kg)   SpO2 97%   BMI 26.94 kg/m   Constitutional:   Pleasant male appears to be in NAD, Well developed, Well nourished, alert and cooperative Eyes:   PEERL, EOMI. No icterus. Conjunctiva pink. Neck:  Supple Throat: Oral cavity and pharynx without inflammation, swelling or lesion.  Respiratory: Respirations even and unlabored. Lungs clear to auscultation bilaterally.   No wheezes, crackles, or rhonchi.  Cardiovascular: Normal S1, S2. Regular rate and rhythm. No peripheral edema, cyanosis or pallor.  Gastrointestinal:  Soft, nondistended, nontender. No rebound or guarding. Normal bowel sounds. No appreciable masses or hepatomegaly. Rectal:  Not performed.  Msk:  Symmetrical without gross deformities. Without edema, no deformity or joint abnormality.  Neurologic:  Alert and  oriented x4;  grossly normal neurologically.  Skin:   Dry and intact without significant lesions or rashes.  RELEVANT LABS AND IMAGING: CBC    Latest Ref Rng & Units 10/24/2024    2:42 PM 10/06/2024    1:50 PM 10/05/2024    4:49 AM  CBC  WBC 3.4 - 10.8 x10E3/uL 7.7  12.7  12.1   Hemoglobin 13.0 - 17.7 g/dL 86.8  85.1  86.4   Hematocrit 37.5 - 51.0 % 38.8  43.8  38.6   Platelets 150 - 450 x10E3/uL 297  438  377      CMP     Latest Ref Rng & Units 10/24/2024    2:42 PM 10/06/2024    1:50 PM 10/05/2024    4:49 AM  CMP  Glucose 70 - 99 mg/dL 94  869  881   BUN 8 - 27 mg/dL 17  8  9    Creatinine 0.76 - 1.27 mg/dL 9.24  9.45  9.51   Sodium 134 - 144 mmol/L 143  137  134   Potassium 3.5 - 5.2 mmol/L 4.1  4.2  3.7   Chloride 96 - 106 mmol/L 106  99  99   CO2 20 - 29 mmol/L 22  19  22    Calcium  8.6 - 10.2 mg/dL 9.5  8.3  8.4   Total Protein 6.0 - 8.5 g/dL 6.4    6.6   Total Bilirubin 0.0 - 1.2 mg/dL 0.4   0.6   Alkaline Phos 47 - 123 IU/L 77   115   AST 0 - 40 IU/L 21   79   ALT 0 - 44 IU/L 25   76      Lab Results  Component Value Date   TSH 3.290 02/15/2024  4/22 echo- Left ventricular ejection fraction, by estimation, is 60 to 65%.   10/09/2024 CTAP  (done with Urology) Sigmoid diverticulitis with localized perforation and associated locules of free air in the small bowel mesentery. Possible second site of diverticulitis in the distal sigmoid colon. Bulky adenopathy in the abdominal retroperitoneum and small bowel mesentery, indicative of lymphoma. Prostate is enlarged Coronary artery calcification  GI procedures:  12/30/2022 colonoscopy , recall 7 years - One 6 mm polyp in the transverse colon, removed with a cold snare. Resected and retrieved.  - Moderate diverticulosis in the left colon.  - Internal hemorrhoids.  - The examination was otherwise normal on direct and retroflexion views. Path:  Diagnosis Surgical [P], colon, transverse, polyp (1) - TUBULAR ADENOMA   Assessment: Encounter Diagnoses  Name Primary?   LLQ pain Yes   Diverticulitis of colon    Abnormal CT scan    Loss of weight    History of colonic polyps     74 year old male patient with history of diverticular disease, recently experiencing constipation and left lower quadrant pain with CT scan that showed sigmoid diverticulitis with localized perforation and associated locules of free air in the small bowel mesentery. Possible second site of diverticulitis in the distal sigmoid colon. Bulky adenopathy in the abdominal  retroperitoneum and small bowel mesentery, indicative of lymphoma.  Patient reports being treated with 2 rounds of antibiotics and currently asymptomatic today.  I suspect the slightly elevated CRP and sed rate was due to inflammation. WBC normal 7.7. Weight loss suspected to be from recent hospitalization. Patient following up with urology next week and  anticipating referral to oncology for possible lymphoma.  Most recent colonoscopy was in March 2024 with 1 TA. ER precautions given.   Plan: -complete doxycycline  as prescribed -follow-up with urology as scheduled  -ER precautions given  -will reach out to PCP to follow-up on plan -recall colonoscopy 12/2029   Thank you for the courtesy of this consult. Please call me with any questions or concerns.   Kashon Kraynak, FNP-C [facility] 10/27/2024, 4:44 PM  Cc: Oris Camie BRAVO, NP  "

## 2024-10-27 NOTE — Patient Instructions (Addendum)
 Diverticulosis Pamphlet attached Complete antibiotics as prescribed --Advised to go to the ER if there is any severe abdominal pain, unable to hold down food/water, blood in stool or vomit, chest pain, shortness of breath, or any worsening symptoms.   _______________________________________________________  If your blood pressure at your visit was 140/90 or greater, please contact your primary care physician to follow up on this.  _______________________________________________________  If you are age 100 or older, your body mass index should be between 23-30. Your Body mass index is 26.94 kg/m. If this is out of the aforementioned range listed, please consider follow up with your Primary Care Provider.  If you are age 92 or younger, your body mass index should be between 19-25. Your Body mass index is 26.94 kg/m. If this is out of the aformentioned range listed, please consider follow up with your Primary Care Provider.   ________________________________________________________  The [facility] providers would like to encourage you to use MYCHART to communicate with providers for non-urgent requests or questions.  Due to long hold times on the telephone, sending your provider a message by [facility] may be a faster and more efficient way to get a response.  Please allow 48 business hours for a response.  Please remember that this is for non-urgent requests.  _______________________________________________________  [facility] is using a team-based approach to care.  Your team is made up of your doctor and two to three APPS. Our APPS (Nurse Practitioners and Physician Assistants) work with your physician to ensure care continuity for you. They are fully qualified to address your health concerns and develop a treatment plan. They communicate directly with your gastroenterologist to care for you. Seeing the Advanced Practice Practitioners on your physician's team can help you by  facilitating care more promptly, often allowing for earlier appointments, access to diagnostic testing, procedures, and other specialty referrals.   Thank you for trusting me with your gastrointestinal care. Deanna May, FNP-C

## 2024-10-30 ENCOUNTER — Other Ambulatory Visit (HOSPITAL_BASED_OUTPATIENT_CLINIC_OR_DEPARTMENT_OTHER): Payer: Self-pay

## 2024-10-31 ENCOUNTER — Encounter: Payer: Self-pay | Admitting: Medical Oncology

## 2024-10-31 ENCOUNTER — Encounter: Payer: Self-pay | Admitting: Nurse Practitioner

## 2024-10-31 ENCOUNTER — Other Ambulatory Visit (HOSPITAL_BASED_OUTPATIENT_CLINIC_OR_DEPARTMENT_OTHER): Payer: Self-pay

## 2024-10-31 ENCOUNTER — Ambulatory Visit: Payer: Self-pay | Admitting: Nurse Practitioner

## 2024-10-31 DIAGNOSIS — R590 Localized enlarged lymph nodes: Secondary | ICD-10-CM

## 2024-11-01 ENCOUNTER — Other Ambulatory Visit: Payer: Self-pay

## 2024-11-01 DIAGNOSIS — R5383 Other fatigue: Secondary | ICD-10-CM

## 2024-11-01 DIAGNOSIS — R790 Abnormal level of blood mineral: Secondary | ICD-10-CM

## 2024-11-01 DIAGNOSIS — R634 Abnormal weight loss: Secondary | ICD-10-CM

## 2024-11-01 DIAGNOSIS — R700 Elevated erythrocyte sedimentation rate: Secondary | ICD-10-CM

## 2024-11-01 DIAGNOSIS — R7982 Elevated C-reactive protein (CRP): Secondary | ICD-10-CM

## 2024-11-02 NOTE — Progress Notes (Signed)
 Agree with the assessment and plan as outlined by [facility], FNP-C.  Thanks for reaching out to PCP to discuss recent CT findings.  Patient looks to be scheduled for appointment in the Oncology Clinic next week.  Yafet Cline, DO, [facility]

## 2024-11-08 ENCOUNTER — Other Ambulatory Visit (HOSPITAL_BASED_OUTPATIENT_CLINIC_OR_DEPARTMENT_OTHER): Payer: Self-pay

## 2024-11-08 ENCOUNTER — Other Ambulatory Visit (HOSPITAL_BASED_OUTPATIENT_CLINIC_OR_DEPARTMENT_OTHER): Payer: Self-pay | Admitting: Nurse Practitioner

## 2024-11-08 ENCOUNTER — Inpatient Hospital Stay

## 2024-11-08 ENCOUNTER — Encounter: Payer: Self-pay | Admitting: Physician Assistant

## 2024-11-08 ENCOUNTER — Inpatient Hospital Stay: Attending: Physician Assistant | Admitting: Physician Assistant

## 2024-11-08 ENCOUNTER — Other Ambulatory Visit: Payer: Self-pay | Admitting: Physician Assistant

## 2024-11-08 ENCOUNTER — Encounter: Payer: Self-pay | Admitting: Medical Oncology

## 2024-11-08 VITALS — BP 122/62 | HR 81 | Temp 97.9°F | Resp 17 | Ht 67.0 in | Wt 184.3 lb

## 2024-11-08 DIAGNOSIS — Z860100 Personal history of colon polyps, unspecified: Secondary | ICD-10-CM | POA: Diagnosis not present

## 2024-11-08 DIAGNOSIS — Z9049 Acquired absence of other specified parts of digestive tract: Secondary | ICD-10-CM | POA: Diagnosis not present

## 2024-11-08 DIAGNOSIS — I10 Essential (primary) hypertension: Secondary | ICD-10-CM | POA: Diagnosis not present

## 2024-11-08 DIAGNOSIS — D649 Anemia, unspecified: Secondary | ICD-10-CM | POA: Insufficient documentation

## 2024-11-08 DIAGNOSIS — R1032 Left lower quadrant pain: Secondary | ICD-10-CM | POA: Insufficient documentation

## 2024-11-08 DIAGNOSIS — R935 Abnormal findings on diagnostic imaging of other abdominal regions, including retroperitoneum: Secondary | ICD-10-CM | POA: Insufficient documentation

## 2024-11-08 DIAGNOSIS — E785 Hyperlipidemia, unspecified: Secondary | ICD-10-CM | POA: Insufficient documentation

## 2024-11-08 DIAGNOSIS — Z79899 Other long term (current) drug therapy: Secondary | ICD-10-CM | POA: Insufficient documentation

## 2024-11-08 DIAGNOSIS — F32A Depression, unspecified: Secondary | ICD-10-CM | POA: Insufficient documentation

## 2024-11-08 DIAGNOSIS — R590 Localized enlarged lymph nodes: Secondary | ICD-10-CM | POA: Insufficient documentation

## 2024-11-08 DIAGNOSIS — R591 Generalized enlarged lymph nodes: Secondary | ICD-10-CM

## 2024-11-08 LAB — CMP (CANCER CENTER ONLY)
ALT: 15 U/L (ref 0–44)
AST: 31 U/L (ref 15–41)
Albumin: 3.8 g/dL (ref 3.5–5.0)
Alkaline Phosphatase: 66 U/L (ref 38–126)
Anion gap: 10 (ref 5–15)
BUN: 11 mg/dL (ref 8–23)
CO2: 26 mmol/L (ref 22–32)
Calcium: 9.5 mg/dL (ref 8.9–10.3)
Chloride: 109 mmol/L (ref 98–111)
Creatinine: 0.62 mg/dL (ref 0.61–1.24)
GFR, Estimated: >60 mL/min
Glucose, Bld: 111 mg/dL — ABNORMAL HIGH (ref 70–99)
Potassium: 3.6 mmol/L (ref 3.5–5.1)
Sodium: 144 mmol/L (ref 135–145)
Total Bilirubin: 0.3 mg/dL (ref 0.0–1.2)
Total Protein: 7.2 g/dL (ref 6.5–8.1)

## 2024-11-08 LAB — CBC WITH DIFFERENTIAL (CANCER CENTER ONLY)
Abs Immature Granulocytes: 0.02 K/uL (ref 0.00–0.07)
Basophils Absolute: 0 K/uL (ref 0.0–0.1)
Basophils Relative: 0 %
Eosinophils Absolute: 0.1 K/uL (ref 0.0–0.5)
Eosinophils Relative: 2 %
HCT: 34.4 % — ABNORMAL LOW (ref 39.0–52.0)
Hemoglobin: 11.7 g/dL — ABNORMAL LOW (ref 13.0–17.0)
Immature Granulocytes: 0 %
Lymphocytes Relative: 23 %
Lymphs Abs: 1.4 K/uL (ref 0.7–4.0)
MCH: 30.8 pg (ref 26.0–34.0)
MCHC: 34 g/dL (ref 30.0–36.0)
MCV: 90.5 fL (ref 80.0–100.0)
Monocytes Absolute: 0.6 K/uL (ref 0.1–1.0)
Monocytes Relative: 10 %
Neutro Abs: 3.8 K/uL (ref 1.7–7.7)
Neutrophils Relative %: 65 %
Platelet Count: 319 K/uL (ref 150–400)
RBC: 3.8 MIL/uL — ABNORMAL LOW (ref 4.22–5.81)
RDW: 14.9 % (ref 11.5–15.5)
WBC Count: 6 K/uL (ref 4.0–10.5)
nRBC: 0 % (ref 0.0–0.2)

## 2024-11-08 LAB — PSA: Prostatic Specific Antigen: 3.51 ng/mL (ref 0.00–4.00)

## 2024-11-08 LAB — LACTATE DEHYDROGENASE: LDH: 229 U/L (ref 105–235)

## 2024-11-08 LAB — C-REACTIVE PROTEIN: CRP: <0.5 mg/dL

## 2024-11-08 LAB — SEDIMENTATION RATE: Sed Rate: 65 mm/h — ABNORMAL HIGH (ref 0–16)

## 2024-11-08 MED ORDER — BISOPROLOL FUMARATE 5 MG PO TABS
2.5000 mg | ORAL_TABLET | Freq: Every day | ORAL | 1 refills | Status: AC
  Filled 2024-11-17: qty 45, 90d supply, fill #0

## 2024-11-08 NOTE — Patient Instructions (Signed)
 Rapid Diagnostic Service Visit Discharge Information and Instructions  Thank you for choosing [facility] for your healthcare needs.  Below is a summary of today's discussion, along with our contact information and an outline of what to expect next.  Reason for Visit:  Bulky abdominal lymphadenopathy  Proposed Diagnostic Care Plan: Labs CT biopsy  What to Expect: - Generally, when lab tests are ordered the results can take up to 1 week for results to be available.  At that point, we will contact you to discuss your results with you.  Unless there is a critical result, we will typically wait for all of your lab results to be available before contacting you. - If a biopsy is part of your Care Plan, those results can take on average 7-10 days to result.  Once results are available, we will contact you to discuss your pathology results and any next steps. - If you have additional imaging ordered, such as a CT Scan, MRI, Ultrasound, Bone Scan, or PET scan, your imaging will need to be authorized then scheduled with the earliest available appointment.  You may be asked to travel to another hospital within [facility] who has a sooner availability, please consider doing so if asked. - If you use MyChart, your results will be available to you in the MyChart portal.  Your provider will be in touch with you as soon as all of your results are available to be discussed.  Your Diagnostic Clinic Provider:  Johnston Police PA-C and Dr. Norleen Kidney, contact number [phone] Your Diagnostic Navigator:  Colene Raider RN, contact number [phone]  If you or your caregiver have number blocking on your cell phones, please ensure the cancer center's numbers are not blocked.  If you are not a registered MyChart user, please consider enrolling in MyChart to receive your test results and visit notes.  You can also access your discharge instructions electronically.  MyChart also gives you an electronic means to  communicate with your Care Team instead of needing to call in to the cancer center.  We appreciate you trusting us  with your healthcare and look forward to partnering with you as we work to uncover what your potential diagnosis may be.  Please do not hesitate to reach out at any point with questions or concerns.

## 2024-11-08 NOTE — Progress Notes (Signed)
 [facility]  Patient presented to clinic, alone, for his scheduled appointment with PA-C Johnston. I introduced myself and provided him with my direct contact information. Patient was encouraged to call me with any questions/concerns he may have.  Colene KYM Raider, RN, BSN Oncology Nurse Navigator, [facility] 11/08/2024 3:46 PM
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
## 2024-11-09 ENCOUNTER — Encounter: Payer: Self-pay | Admitting: Family Medicine

## 2024-11-09 ENCOUNTER — Ambulatory Visit: Admitting: Family Medicine

## 2024-11-09 ENCOUNTER — Encounter: Payer: Self-pay | Admitting: Radiology

## 2024-11-09 ENCOUNTER — Other Ambulatory Visit (HOSPITAL_BASED_OUTPATIENT_CLINIC_OR_DEPARTMENT_OTHER): Payer: Self-pay

## 2024-11-09 VITALS — BP 122/68 | HR 83 | Wt 181.2 lb

## 2024-11-09 DIAGNOSIS — M5416 Radiculopathy, lumbar region: Secondary | ICD-10-CM

## 2024-11-09 MED ORDER — TRAMADOL HCL 50 MG PO TABS
50.0000 mg | ORAL_TABLET | Freq: Three times a day (TID) | ORAL | 0 refills | Status: AC | PRN
  Filled 2024-11-09: qty 60, 20d supply, fill #0

## 2024-11-09 MED ORDER — GABAPENTIN 300 MG PO CAPS
300.0000 mg | ORAL_CAPSULE | Freq: Every day | ORAL | 1 refills | Status: AC
  Filled 2024-11-09: qty 90, 90d supply, fill #0

## 2024-11-09 NOTE — Progress Notes (Signed)
" ° °  Name: Clinton Newton   Date of Visit: 11/09/24   Date of last visit with me: 10/06/2024   CHIEF COMPLAINT:  Chief Complaint  Patient presents with   Follow-up    F/U on Sciatica, pt states that overall they are glad with their results on the epidural, although they do experience some pain at night (bottom of the left foot). Pt self medicates with pain meds Dr.Celicia Minahan Rx.        HPI:  Discussed the use of AI scribe software for clinical note transcription with the patient, who gave verbal consent to proceed.  History of Present Illness   Clinton Newton is a 74 year old male who presents for follow-up after an epidural injection for back pain.  Following an epidural injection two months ago, he experienced immediate relief from his back pain. However, he now occasionally experiences sharp pain in his foot at night, severe enough to wake him up. He has been using Ultram  (tramadol ) as prescribed, which allows him to return to sleep.  During the epidural procedure, he was unsure if he received one or two injections. He appreciated the care provided by the nurse during the procedure.  He has previously used gabapentin  for nerve-related pain during an initial flare-up, but found it ineffective at that time.  He expresses concern about an upcoming needle biopsy scheduled for next week, related to his oncological care, and mentions feeling nervous about the procedure.  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         OBJECTIVE:       02/15/2024    2:04 PM  Depression screen PHQ 2/9  Decreased Interest 0  Down, Depressed, Hopeless 0  PHQ - 2 Score 0     BP Readings from Last 3 Encounters:  11/09/24 122/68  11/08/24 122/62  10/27/24 (!) 100/54    BP 122/68   Pulse 83   Wt 181 lb 3.2 oz (82.2 kg)   SpO2 96%   BMI 28.38 kg/m    Physical Exam           Physical Exam Constitutional:      Appearance: Normal appearance.  Neurological:     General: No focal deficit present.     Mental Status: He is alert and oriented to person, place, and time. Mental status is at baseline.     ASSESSMENT/PLAN:   Assessment & Plan Lumbar radiculitis    Assessment and Plan    Lumbar radiculopathy Chronic lumbar radiculopathy with intermittent sharp foot pain. Previous epidural injection effective but symptoms returned. Gabapentin  trial planned for nerve pain. Discussed potential natural spine fusion and surgical options if pain worsens. Conservative management preferred. - Prescribed tramadol  for nighttime pain. - Initiated gabapentin  300 mg at night, titrate to 900 mg if needed. - Referred to spine surgeon for evaluation. - Advised continuation of activities as tolerated.         Atif Chapple A. Vita MD [facility] "

## 2024-11-09 NOTE — Progress Notes (Signed)
 abcock, Cordella LABOR, MD  Daralene Ferol FALCON, RT PROCEDURE / BIOPSY REVIEW Date: 11/08/2024  Requested Biopsy site: Pelvis lymphadenopathy Reason for request: central and left sided pelvic lymphadenopathy Imaging review: Best seen on CT 10/09/24  Decision: Approved Imaging modality to perform: CT Schedule with: Moderate Sedation Schedule for: Any VIR  Additional comments: @Schedulers .  MAY HAVE LIMITED WINDOW FOR ACCESS  Please contact me with questions, concerns, or if issue pertaining to this request arise.  Cordella LABOR Banner, MD [facility]       Previous Messages    ----- Message ----- From: Daralene Ferol FALCON, RT Sent: 11/08/2024   3:51 PM EST To: Ir Procedure Requests Subject: CT Biopsy                                      Procedure :CT Biopsy  Reason :bulky abdominal lymphadenopathy Dx: Lymphadenopathy [R59.1 (ICD-10-CM)]    History :US  LIMITED JOINT SPACE STRUCTURES LOW BILAT(NO LINKED CHARGES) (Accession [id number]) (Order [id number]),$MzfnczAzqnmzIZPI_IrgPNEWpniMAKHCOlNpfddLqiqiknziS$$MzfnczAzqnmzIZPI_IrgPNEWpniMAKHCOlNpfddLqiqiknziS$  LIMITED JOINT SPACE STRUCTURES LOW BILAT(NO LINKED CHARGES) (Accession [id number]) (Order [id number]), CT Head Wo Contrast (Accession [id number]) (Order [id number]), [place]  LIMITED JOINT SPACE STRUCTURES LOW BILAT(NO LINKED CHARGES) (Accession [id number]) (Order [id number]),    Provider:Neomi Johnston DASEN, PA-C  Provider contact ;  [phone]

## 2024-11-10 LAB — SURGICAL PATHOLOGY

## 2024-11-13 ENCOUNTER — Other Ambulatory Visit: Payer: Self-pay | Admitting: Medical Oncology

## 2024-11-13 ENCOUNTER — Other Ambulatory Visit: Payer: Self-pay

## 2024-11-13 ENCOUNTER — Inpatient Hospital Stay

## 2024-11-13 ENCOUNTER — Encounter: Payer: Self-pay | Admitting: Physician Assistant

## 2024-11-13 ENCOUNTER — Encounter: Payer: Self-pay | Admitting: Medical Oncology

## 2024-11-13 ENCOUNTER — Other Ambulatory Visit (HOSPITAL_COMMUNITY): Payer: Self-pay | Admitting: Physician Assistant

## 2024-11-13 DIAGNOSIS — D649 Anemia, unspecified: Secondary | ICD-10-CM

## 2024-11-13 DIAGNOSIS — R591 Generalized enlarged lymph nodes: Secondary | ICD-10-CM

## 2024-11-13 DIAGNOSIS — R590 Localized enlarged lymph nodes: Secondary | ICD-10-CM | POA: Diagnosis not present

## 2024-11-13 LAB — VITAMIN B12: Vitamin B-12: 630 pg/mL (ref 180–914)

## 2024-11-13 LAB — RETIC PANEL
Immature Retic Fract: 23.1 % — ABNORMAL HIGH (ref 2.3–15.9)
RBC.: 3.99 MIL/uL — ABNORMAL LOW (ref 4.22–5.81)
Retic Count, Absolute: 162.8 K/uL (ref 19.0–186.0)
Retic Ct Pct: 4.1 % — ABNORMAL HIGH (ref 0.4–3.1)
Reticulocyte Hemoglobin: 36 pg

## 2024-11-13 LAB — FLOW CYTOMETRY

## 2024-11-13 LAB — IRON AND IRON BINDING CAPACITY (CC-WL,HP ONLY)
Iron: 95 ug/dL (ref 45–182)
Saturation Ratios: 39 % (ref 17.9–39.5)
TIBC: 248 ug/dL — ABNORMAL LOW (ref 250–450)
UIBC: 152 ug/dL

## 2024-11-13 LAB — FOLATE: Folate: 7.5 ng/mL

## 2024-11-13 LAB — FERRITIN: Ferritin: 608 ng/mL — ABNORMAL HIGH (ref 24–336)

## 2024-11-13 NOTE — Progress Notes (Signed)
 [facility]  Outgoing call to patient in response to email received from him over the weekend. Patient inquiring about his lab results and next steps.   Reviewed with DEVONNA Lis, informed patient that his labs resulted in mild anemia and one inflammatory marker resulted elevated. Informed patient would like him to return for anemia panel lab draw. Patient confirmed available today for lab draw and was scheduled for 11:45 am. Patient also informed next step is biopsy. Informed patient that he was contacted by scheduling for biopsy and were informed that he wished to wait due to the appointment that was offered was the following date and he wanted to discuss with RDS team about the urgency. Patient informed that scheduling had availability for that time and our goal is to scheduled as soon as an appt is available. Patient gave verbal understanding. Patient provided with IR scheduler contact number for scheduling the appointment. Patient denies further questions at this time. Patient encouraged to call with questions/concerns.   Colene KYM Raider, RN, BSN Oncology Nurse Navigator, [facility] 11/13/2024 10:07 AM

## 2024-11-15 ENCOUNTER — Encounter: Payer: Self-pay | Admitting: Medical Oncology

## 2024-11-15 ENCOUNTER — Other Ambulatory Visit (HOSPITAL_BASED_OUTPATIENT_CLINIC_OR_DEPARTMENT_OTHER): Payer: Self-pay

## 2024-11-15 ENCOUNTER — Other Ambulatory Visit: Payer: Self-pay

## 2024-11-15 ENCOUNTER — Other Ambulatory Visit: Payer: Self-pay | Admitting: Nurse Practitioner

## 2024-11-15 DIAGNOSIS — F418 Other specified anxiety disorders: Secondary | ICD-10-CM

## 2024-11-15 DIAGNOSIS — K5792 Diverticulitis of intestine, part unspecified, without perforation or abscess without bleeding: Secondary | ICD-10-CM

## 2024-11-15 DIAGNOSIS — F5104 Psychophysiologic insomnia: Secondary | ICD-10-CM

## 2024-11-15 MED ORDER — BUSPIRONE HCL 7.5 MG PO TABS
7.5000 mg | ORAL_TABLET | Freq: Three times a day (TID) | ORAL | 3 refills | Status: AC
  Filled 2024-11-17: qty 270, 90d supply, fill #0

## 2024-11-15 MED ORDER — ESCITALOPRAM OXALATE 10 MG PO TABS
10.0000 mg | ORAL_TABLET | Freq: Every day | ORAL | 3 refills | Status: AC
  Filled 2024-11-15: qty 30, 30d supply, fill #0

## 2024-11-15 MED ORDER — TRAZODONE HCL 50 MG PO TABS
25.0000 mg | ORAL_TABLET | Freq: Every evening | ORAL | 3 refills | Status: AC | PRN
  Filled 2024-11-15: qty 30, 30d supply, fill #0

## 2024-11-15 NOTE — Progress Notes (Signed)
 [facility]  LVM with patient, noticed he hasn't made his biopsy appointment yet. Offered assistance to schedule for him. My call back number provided.   Colene KYM Raider, RN, BSN Oncology Nurse Navigator, [facility] 11/15/2024 10:49 AM

## 2024-11-15 NOTE — Telephone Encounter (Signed)
 Does the pt. Need to remain on this.

## 2024-11-16 ENCOUNTER — Other Ambulatory Visit (HOSPITAL_COMMUNITY)

## 2024-11-16 ENCOUNTER — Other Ambulatory Visit (HOSPITAL_BASED_OUTPATIENT_CLINIC_OR_DEPARTMENT_OTHER): Payer: Self-pay

## 2024-11-16 ENCOUNTER — Encounter: Payer: Self-pay | Admitting: Nurse Practitioner

## 2024-11-16 MED ORDER — DOXYCYCLINE HYCLATE 100 MG PO CAPS
100.0000 mg | ORAL_CAPSULE | Freq: Two times a day (BID) | ORAL | 0 refills | Status: AC
  Filled 2024-11-16: qty 30, 15d supply, fill #0

## 2024-11-17 ENCOUNTER — Other Ambulatory Visit: Payer: Self-pay

## 2024-11-17 ENCOUNTER — Encounter: Payer: Self-pay | Admitting: Medical Oncology

## 2024-11-17 ENCOUNTER — Other Ambulatory Visit (HOSPITAL_BASED_OUTPATIENT_CLINIC_OR_DEPARTMENT_OTHER): Payer: Self-pay

## 2024-11-17 NOTE — Progress Notes (Signed)
 [facility]  LVM with patient regarding recent lab results. Informed patient, per DEVONNA Lis, labs collected on 1/19 do not show nutritional deficiencies. Biopsy scheduled for 2/2 should determine if additional workup is needed for anemia or if there is a relation to the lymphadenopathy. Patient encouraged to call me with questions/concerns.  Colene KYM Raider, RN, BSN Oncology Nurse Navigator, [facility] 11/17/2024 2:35 PM

## 2024-11-24 ENCOUNTER — Other Ambulatory Visit: Payer: Self-pay | Admitting: Radiology

## 2024-11-24 DIAGNOSIS — R591 Generalized enlarged lymph nodes: Secondary | ICD-10-CM

## 2024-11-26 ENCOUNTER — Other Ambulatory Visit: Payer: Self-pay

## 2024-11-27 ENCOUNTER — Ambulatory Visit (HOSPITAL_COMMUNITY): Admission: RE | Admit: 2024-11-27 | Source: Ambulatory Visit

## 2024-11-28 ENCOUNTER — Ambulatory Visit: Payer: Medicare Other

## 2024-11-28 ENCOUNTER — Ambulatory Visit: Admitting: *Deleted

## 2024-11-28 VITALS — BP 140/82 | Ht 67.0 in | Wt 185.0 lb

## 2024-11-28 DIAGNOSIS — Z0000 Encounter for general adult medical examination without abnormal findings: Secondary | ICD-10-CM | POA: Diagnosis not present

## 2024-11-28 NOTE — Progress Notes (Signed)
 "  Chief Complaint  Patient presents with   Medicare Wellness     Subjective:   Clinton Newton is a 74 y.o. male who presents for a Medicare Annual Wellness Visit.  Visit info / Clinical Intake: Medicare Wellness Visit Type:: Subsequent Annual Wellness Visit Persons participating in visit and providing information:: patient Medicare Wellness Visit Mode:: In-person (required for WTM) Interpreter Needed?: No Pre-visit prep was completed: no AWV questionnaire completed by patient prior to visit?: yes Date:: 11/27/24 Living arrangements:: lives with spouse/significant other Patient's Overall Health Status Rating: (!) fair Typical amount of pain: some Does pain affect daily life?: no  Dietary Habits and Nutritional Risks How many meals a day?: 3 Eats fruit and vegetables daily?: yes Most meals are obtained by: preparing own meals In the last 2 weeks, have you had any of the following?: none Diabetic:: no  Functional Status Activities of Daily Living (to include ambulation/medication): Independent Ambulation: Independent Medication Administration: Independent Home Management (perform basic housework or laundry): Independent Manage your own finances?: yes Primary transportation is: driving Concerns about vision?: no *vision screening is required for WTM* Concerns about hearing?: no  Fall Screening Falls in the past year?: 0 Number of falls in past year: 0 Was there an injury with Fall?: 0 Fall Risk Category Calculator: 0 Patient Fall Risk Level: Low Fall Risk  Fall Risk Patient at Risk for Falls Due to: No Fall Risks Fall risk Follow up: Falls evaluation completed; Education provided; Falls prevention discussed  Home and Transportation Safety: All rugs have non-skid backing?: yes All stairs or steps have railings?: yes Grab bars in the bathtub or shower?: yes Have non-skid surface in bathtub or shower?: yes Good home lighting?: yes Regular seat belt use?:  yes Hospital stays in the last year:: (!) yes How many hospital stays:: 1  Cognitive Assessment Difficulty concentrating, remembering, or making decisions? : no Will 6CIT or Mini Cog be Completed: yes What year is it?: 0 points What month is it?: 0 points Give patient an address phrase to remember (5 components): its very sunny outside today in February About what time is it?: 0 points Count backwards from 20 to 1: 0 points Say the months of the year in reverse: 2 points Repeat the address phrase from earlier: 2 points 6 CIT Score: 4 points  Advance Directives (For Healthcare) Does Patient Have a Medical Advance Directive?: Yes Type of Advance Directive: Healthcare Power of Attorney Copy of Healthcare Power of Attorney in Chart?: No - copy requested Would patient like information on creating a medical advance directive?: No - Patient declined  Reviewed/Updated  Reviewed/Updated: Reviewed All (Medical, Surgical, Family, Medications, Allergies, Care Teams, Patient Goals); Surgical History; Family History; Medications; Allergies; Care Teams; Patient Goals; Medical History    Allergies (verified) Patient has no known allergies.   Current Medications (verified) Outpatient Encounter Medications as of 11/28/2024  Medication Sig   alfuzosin  (UROXATRAL ) 10 MG 24 hr tablet Take 1 tablet (10 mg total) by mouth daily.   amLODipine  (NORVASC ) 10 MG tablet Take 1 tablet (10 mg total) by mouth daily.   ascorbic acid (VITAMIN C) 1000 MG tablet Take 1,000 mg by mouth daily.   bisoprolol  (ZEBETA ) 5 MG tablet Take 1/2 tablet (2.5 mg total) by mouth daily.   busPIRone  (BUSPAR ) 7.5 MG tablet Take 1 tablet (7.5 mg total) by mouth 3 (three) times daily.   doxycycline  (VIBRAMYCIN ) 100 MG capsule Take 1 capsule (100 mg total) by mouth 2 (two) times daily.  escitalopram  (LEXAPRO ) 10 MG tablet Take 1 tablet (10 mg total) by mouth daily.   Flaxseed, Linseed, (FLAXSEED OIL PO) Take 1 capsule by mouth daily.    gabapentin  (NEURONTIN ) 300 MG capsule Take 1 capsule (300 mg total) by mouth at bedtime.   hydrocortisone  (ANUSOL -HC) 25 MG suppository Place 1 suppository (25 mg total) rectally 2 (two) times daily as needed for hemorrhoids or anal itching.   Multiple Vitamin (MULTIVITAMIN) tablet Take 1 tablet by mouth daily with breakfast.   Omega-3 Fatty Acids (FISH OIL PO) Take 1 capsule by mouth daily.   oseltamivir  (TAMIFLU ) 75 MG capsule Take 1 capsule (75 mg total) by mouth 2 (two) times daily. Take with food.   Probiotic Product (PROBIOTIC PO) Take 1 capsule by mouth daily.   rosuvastatin  (CRESTOR ) 20 MG tablet Take 1 tablet (20 mg total) by mouth daily.   traMADol  (ULTRAM ) 50 MG tablet Take 1 tablet (50 mg total) by mouth every 8 (eight) hours as needed for up to 5 days.   traZODone  (DESYREL ) 50 MG tablet Take 0.5-1 tablets (25-50 mg total) by mouth at bedtime as needed for sleep.   vitamin B-12 (CYANOCOBALAMIN) 1000 MCG tablet Take 1,000 mcg by mouth daily.   Vitamin D-Vitamin K (VITAMIN K2-VITAMIN D3 PO) Take 1 capsule by mouth daily.   ADVIL 200 MG CAPS Take 800 mg by mouth every 8 (eight) hours as needed (for pain or headaches).   polyethylene glycol powder (GLYCOLAX /MIRALAX ) 17 GM/SCOOP powder Take 17 g by mouth 2 (two) times daily as needed. Dissolve 1 capful (17g) in 4-8 ounces of liquid and take by mouth daily. (Patient not taking: Reported on 11/09/2024)   PRILOSEC OTC 20 MG tablet Take 20 mg by mouth daily before breakfast.   No facility-administered encounter medications on file as of 11/28/2024.    History: Past Medical History:  Diagnosis Date   Arthritis    L3-L4   Cataract    Colon polyps    Diverticulitis    GERD (gastroesophageal reflux disease)    Hyperlipidemia    Hypertension    Past Surgical History:  Procedure Laterality Date   APPENDECTOMY     COLONOSCOPY     POLYPECTOMY     PRE-MALIGNANT / BENIGN SKIN LESION EXCISION     Family History  Problem Relation Age of  Onset   Heart disease Father    Colon cancer Neg Hx    Pancreatic cancer Neg Hx    Rectal cancer Neg Hx    Stomach cancer Neg Hx    Esophageal cancer Neg Hx    Colon polyps Neg Hx    Social History   Occupational History   Occupation: Retired  Tobacco Use   Smoking status: Never   Smokeless tobacco: Never  Vaping Use   Vaping status: Never Used  Substance and Sexual Activity   Alcohol use: Not Currently   Drug use: No   Sexual activity: Yes   Tobacco Counseling Counseling given: Not Answered  SDOH Screenings   Food Insecurity: No Food Insecurity (11/28/2024)  Housing: High Risk (11/28/2024)  Transportation Needs: No Transportation Needs (11/28/2024)  Utilities: Not At Risk (11/28/2024)  Alcohol Screen: Low Risk (11/18/2023)  Depression (PHQ2-9): Low Risk (11/28/2024)  Financial Resource Strain: Low Risk (10/23/2024)  Physical Activity: Insufficiently Active (11/28/2024)  Social Connections: Socially Isolated (11/28/2024)  Stress: Stress Concern Present (11/28/2024)  Tobacco Use: Low Risk (11/28/2024)  Health Literacy: Adequate Health Literacy (11/28/2024)   See flowsheets for full screening details  Depression Screen PHQ 2 & 9 Depression Scale- Over the past 2 weeks, how often have you been bothered by any of the following problems? Little interest or pleasure in doing things: 0 Feeling down, depressed, or hopeless (PHQ Adolescent also includes...irritable): 1 PHQ-2 Total Score: 1 Trouble falling or staying asleep, or sleeping too much: 0 Feeling tired or having little energy: 0 Poor appetite or overeating (PHQ Adolescent also includes...weight loss): 0 Feeling bad about yourself - or that you are a failure or have let yourself or your family down: 0 Trouble concentrating on things, such as reading the newspaper or watching television (PHQ Adolescent also includes...like school work): 0 Moving or speaking so slowly that other people could have noticed. Or the opposite - being so  fidgety or restless that you have been moving around a lot more than usual: 0 Thoughts that you would be better off dead, or of hurting yourself in some way: 0 PHQ-9 Total Score: 1 If you checked off any problems, how difficult have these problems made it for you to do your work, take care of things at home, or get along with other people?: Not difficult at all     Goals Addressed             This Visit's Progress    Patient Stated       Stay healthy              Objective:    Today's Vitals   11/28/24 1321  BP: (!) 140/82  Weight: 185 lb (83.9 kg)  Height: 5' 7 (1.702 m)   Body mass index is 28.98 kg/m.  Hearing/Vision screen Hearing Screening - Comments:: No trouble hearing Vision Screening - Comments:: Up to date Cataract surgery 2025 Immunizations and Health Maintenance Health Maintenance  Topic Date Due   COVID-19 Vaccine (9 - 2025-26 season) 01/02/2025   Medicare Annual Wellness (AWV)  11/28/2025   Colonoscopy  12/29/2029   DTaP/Tdap/Td (4 - Td or Tdap) 12/31/2032   Pneumococcal Vaccine: 50+ Years  Completed   Influenza Vaccine  Completed   Hepatitis C Screening  Completed   Zoster Vaccines- Shingrix  Completed   Meningococcal B Vaccine  Aged Out        Assessment/Plan:  This is a routine wellness examination for Clinton Newton.  Patient Care Team: Early, Camie BRAVO, NP as PCP - General (Nurse Practitioner) Watt Rush, MD as Attending Physician (Urology) Melodi Dempsey, MD as Consulting Physician (Orthopedic Surgery) Helga Slice, MD as Consulting Physician (Dermatology) Aneita Gwendlyn DASEN, MD (Inactive) as Consulting Physician (Gastroenterology) Leslee Reusing, MD as Consulting Physician (Ophthalmology) Golden Forestine BROCKS, RN as Oncology Nurse Navigator (Medical Oncology)  I have personally reviewed and noted the following in the patients chart:   Medical and social history Use of alcohol, tobacco or illicit drugs  Current medications and supplements  including opioid prescriptions. Functional ability and status Nutritional status Physical activity Advanced directives List of other physicians Hospitalizations, surgeries, and ER visits in previous 12 months Vitals Screenings to include cognitive, depression, and falls Referrals and appointments  No orders of the defined types were placed in this encounter.  In addition, I have reviewed and discussed with patient certain preventive protocols, quality metrics, and best practice recommendations. A written personalized care plan for preventive services as well as general preventive health recommendations were provided to patient.   Teryn Boerema, LPN   04/30/7972   Return in 1 year (on 11/28/2025).  After Visit Summary: (MyChart) Due to  this being a telephonic visit, the after visit summary with patients personalized plan was offered to patient via MyChart   Nurse Notes:  "

## 2024-12-01 ENCOUNTER — Other Ambulatory Visit: Payer: Self-pay | Admitting: Student

## 2024-12-04 ENCOUNTER — Ambulatory Visit (HOSPITAL_COMMUNITY)

## 2024-12-04 DIAGNOSIS — Z01818 Encounter for other preprocedural examination: Secondary | ICD-10-CM

## 2024-12-13 ENCOUNTER — Other Ambulatory Visit

## 2024-12-14 ENCOUNTER — Ambulatory Visit: Admitting: Orthopedic Surgery

## 2024-12-21 ENCOUNTER — Ambulatory Visit: Admitting: Nurse Practitioner

## 2025-01-09 ENCOUNTER — Ambulatory Visit: Admitting: Family Medicine
